# Patient Record
Sex: Male | Born: 1955 | Race: White | Hispanic: No | Smoking: Former smoker
Health system: Southern US, Community
[De-identification: ages and names within clinical notes are randomized; demographics above are authoritative.]

## PROBLEM LIST (undated history)

## (undated) DIAGNOSIS — C801 Malignant (primary) neoplasm, unspecified: Secondary | ICD-10-CM

## (undated) DIAGNOSIS — E079 Disorder of thyroid, unspecified: Secondary | ICD-10-CM

## (undated) DIAGNOSIS — F419 Anxiety disorder, unspecified: Secondary | ICD-10-CM

## (undated) DIAGNOSIS — F32A Depression, unspecified: Secondary | ICD-10-CM

## (undated) MED FILL — Fosaprepitant Dimeglumine For IV Infusion 150 MG (Base Eq): INTRAVENOUS | Qty: 5 | Status: AC

## (undated) MED FILL — Dexamethasone Sodium Phosphate Inj 100 MG/10ML: INTRAMUSCULAR | Qty: 1 | Status: AC

---

## 1999-10-18 ENCOUNTER — Encounter: Admission: RE | Admit: 1999-10-18 | Discharge: 1999-11-09 | Payer: Self-pay

## 2018-07-26 ENCOUNTER — Encounter (HOSPITAL_BASED_OUTPATIENT_CLINIC_OR_DEPARTMENT_OTHER): Payer: Self-pay | Admitting: Emergency Medicine

## 2018-07-26 ENCOUNTER — Other Ambulatory Visit: Payer: Self-pay

## 2018-07-26 ENCOUNTER — Emergency Department (HOSPITAL_BASED_OUTPATIENT_CLINIC_OR_DEPARTMENT_OTHER): Payer: Commercial Managed Care - PPO

## 2018-07-26 ENCOUNTER — Emergency Department (HOSPITAL_BASED_OUTPATIENT_CLINIC_OR_DEPARTMENT_OTHER)
Admission: EM | Admit: 2018-07-26 | Discharge: 2018-07-26 | Disposition: A | Discharge status: Home / Self Care | Payer: Commercial Managed Care - PPO | Attending: Emergency Medicine | Admitting: Emergency Medicine

## 2018-07-26 DIAGNOSIS — Y929 Unspecified place or not applicable: Secondary | ICD-10-CM | POA: Diagnosis not present

## 2018-07-26 DIAGNOSIS — W11XXXA Fall on and from ladder, initial encounter: Secondary | ICD-10-CM | POA: Diagnosis not present

## 2018-07-26 DIAGNOSIS — S0083XA Contusion of other part of head, initial encounter: Secondary | ICD-10-CM | POA: Insufficient documentation

## 2018-07-26 DIAGNOSIS — S59911A Unspecified injury of right forearm, initial encounter: Secondary | ICD-10-CM | POA: Diagnosis present

## 2018-07-26 DIAGNOSIS — S52501A Unspecified fracture of the lower end of right radius, initial encounter for closed fracture: Secondary | ICD-10-CM | POA: Diagnosis not present

## 2018-07-26 DIAGNOSIS — Y939 Activity, unspecified: Secondary | ICD-10-CM | POA: Diagnosis not present

## 2018-07-26 DIAGNOSIS — Y999 Unspecified external cause status: Secondary | ICD-10-CM | POA: Diagnosis not present

## 2018-07-26 MED ORDER — OXYCODONE-ACETAMINOPHEN 5-325 MG PO TABS
1.0000 | ORAL_TABLET | ORAL | 0 refills | Status: AC | PRN
Start: 2018-07-26 — End: 2018-07-29

## 2018-07-26 MED ORDER — OXYCODONE-ACETAMINOPHEN 5-325 MG PO TABS
1.0000 | ORAL_TABLET | Freq: Once | ORAL | Status: AC
  Administered 2018-07-26: 1 via ORAL
  Filled 2018-07-26: qty 1

## 2018-07-26 NOTE — ED Provider Notes (Signed)
[facility] Provider Note   CSN: [id number] Arrival date & time: 07/26/18  1006     History   Chief Complaint Chief Complaint  Patient presents with  . Fall    HPI Alec Gibson is a 62 y.o. male.  62 y/o male with a PMH of thyroid disease presents to the ED s/p fall x 1 hour ago.Patient was standing on 4 feet ladder and fell off landing on his right hand and face. Patient states he was able to move his right hand after the accident but is unable to do so now without pain. He denies any LOC or headache at the moment. Patient is not on blood thinners. He denies any LOC, chest pain or shortness of breath.      History reviewed. No pertinent past medical history.  There are no active problems to display for this patient.   History reviewed. No pertinent surgical history.      Home Medications    Prior to Admission medications   Medication Sig Start Date End Date Taking? Authorizing Provider  oxyCODONE-acetaminophen (PERCOCET/ROXICET) 5-325 MG tablet Take 1 tablet by mouth every 4 (four) hours as needed for up to 3 days for severe pain. 07/26/18 07/29/18  Janeece Fitting, PA-C    Family History No family history on file.  Social History Social History   Tobacco Use  . Smoking status: Never Smoker  . Smokeless tobacco: Never Used  Substance Use Topics  . Alcohol use: Yes  . Drug use: Never     Allergies   Sulfa antibiotics   Review of Systems Review of Systems  Constitutional: Negative for chills and fever.  HENT: Negative for ear pain and sore throat.   Eyes: Negative for pain and visual disturbance.  Respiratory: Negative for cough and shortness of breath.   Cardiovascular: Negative for chest pain and palpitations.  Gastrointestinal: Negative for abdominal pain and vomiting.  Genitourinary: Negative for dysuria and hematuria.  Musculoskeletal: Positive for arthralgias and joint swelling. Negative for back pain.  Skin: Negative  for color change and rash.  Neurological: Negative for seizures and syncope.  All other systems reviewed and are negative.    Physical Exam Updated Vital Signs BP 140/84 (BP Location: Left Arm)   Pulse 70   Temp 98.4 F (36.9 C) (Oral)   Resp 18   SpO2 100%   Physical Exam  Constitutional: He is oriented to person, place, and time. He appears well-developed and well-nourished.  HENT:  Head: Normocephalic and atraumatic.  Mouth/Throat: Oropharynx is clear and moist.  Eyes: Pupils are equal, round, and reactive to light. No scleral icterus.  Neck: Normal range of motion.  Cardiovascular: Normal heart sounds.  Pulses:      Radial pulses are 2+ on the right side, and 2+ on the left side.  Pulmonary/Chest: Effort normal and breath sounds normal. He has no wheezes. He exhibits no tenderness.  Abdominal: Soft. Bowel sounds are normal. He exhibits no distension. There is no tenderness.  Musculoskeletal: He exhibits edema, tenderness and deformity.       Right wrist: He exhibits decreased range of motion, tenderness, bony tenderness, swelling and deformity. He exhibits no laceration.       Arms:      Right hand: He exhibits normal capillary refill, no deformity, no laceration and no swelling. Decreased strength noted. He exhibits no finger abduction, no thumb/finger opposition and no wrist extension trouble.  Pulses present. Radial, ulnar and median tested, intact.  Neurological: He is alert and oriented to person, place, and time.  Skin: Skin is warm and dry.  Nursing note and vitals reviewed.    ED Treatments / Results  Labs (all labs ordered are listed, but only abnormal results are displayed) Labs Reviewed - No data to display  EKG None  Radiology Dg Forearm Right  Result Date: 07/26/2018 CLINICAL DATA:  Fall from ladder today with right wrist pain/deformity. EXAM: RIGHT FOREARM - 2 VIEW COMPARISON:  None. FINDINGS: Evidence patient's displaced comminuted fracture of the  distal radius with extension to the articular surface. No other fractures identified. IMPRESSION: Comminuted displaced distal radial fracture with extension to the articular surface. No additional fractures identified. Electronically Signed   By: Marin Olp M.D.   On: 07/26/2018 11:19   Dg Wrist Complete Right  Result Date: 07/26/2018 CLINICAL DATA:  Fall from stepladder today with right wrist injury. EXAM: RIGHT WRIST - COMPLETE 3+ VIEW COMPARISON:  None. FINDINGS: There is a displaced comminuted fracture of the distal radius extending to the articular surface. Remainder of the exam is unremarkable. IMPRESSION: Displaced comminuted fracture of the distal radius with extension to the articular surface. Electronically Signed   By: Marin Olp M.D.   On: 07/26/2018 10:46    Procedures Procedures (including critical care time)  Medications Ordered in ED Medications  oxyCODONE-acetaminophen (PERCOCET/ROXICET) 5-325 MG per tablet 1 tablet (1 tablet Oral Given 07/26/18 1106)     Initial Impression / Assessment and Plan / ED Course  I have reviewed the triage vital signs and the nursing notes.  Pertinent labs & imaging results that were available during my care of the patient were reviewed by me and considered in my medical decision making (see chart for details).     Patient presents s/p fall from ladder. DG right wrist showed a displaced comminuted fracture of the distal radius. Patient is neurovascularly intact. Pulses presents.  Patient received percocet for pain, called neighbor for ride.   Consult place to Dr.Murphy who advised patient be place on sugar tong splint and have he will see him in clinic tomorrow Monday 08/19 in the morning. I will send patient home with pain control and follow up with Hand specialist tomorrow morning. Return precautions provided.   Final Clinical Impressions(s) / ED Diagnoses   Final diagnoses:  Closed fracture of distal end of right radius,  unspecified fracture morphology, initial encounter    ED Discharge Orders         Ordered    oxyCODONE-acetaminophen (PERCOCET/ROXICET) 5-325 MG tablet  Every 4 hours PRN     07/26/18 1156           Janeece Fitting, PA-C 07/26/18 1158    Julianne Rice, MD 07/28/18 1727

## 2018-07-26 NOTE — ED Triage Notes (Signed)
Pt fell off of a 4 foot ladder injuring R wrist, deformity noted. Also has an abrasion to forehead. Denies LOC or other injury.

## 2018-07-26 NOTE — Discharge Instructions (Signed)
I have provided medication for your pain please

## 2019-08-02 IMAGING — CR DG FOREARM 2V*R*
2 series · 2 of 2 positions shown · non-contrast
Comparison: None.

CLINICAL DATA: Fall from ladder today with right wrist
pain/deformity.

EXAM:
RIGHT FOREARM - 2 VIEW

[x forearm ap right]
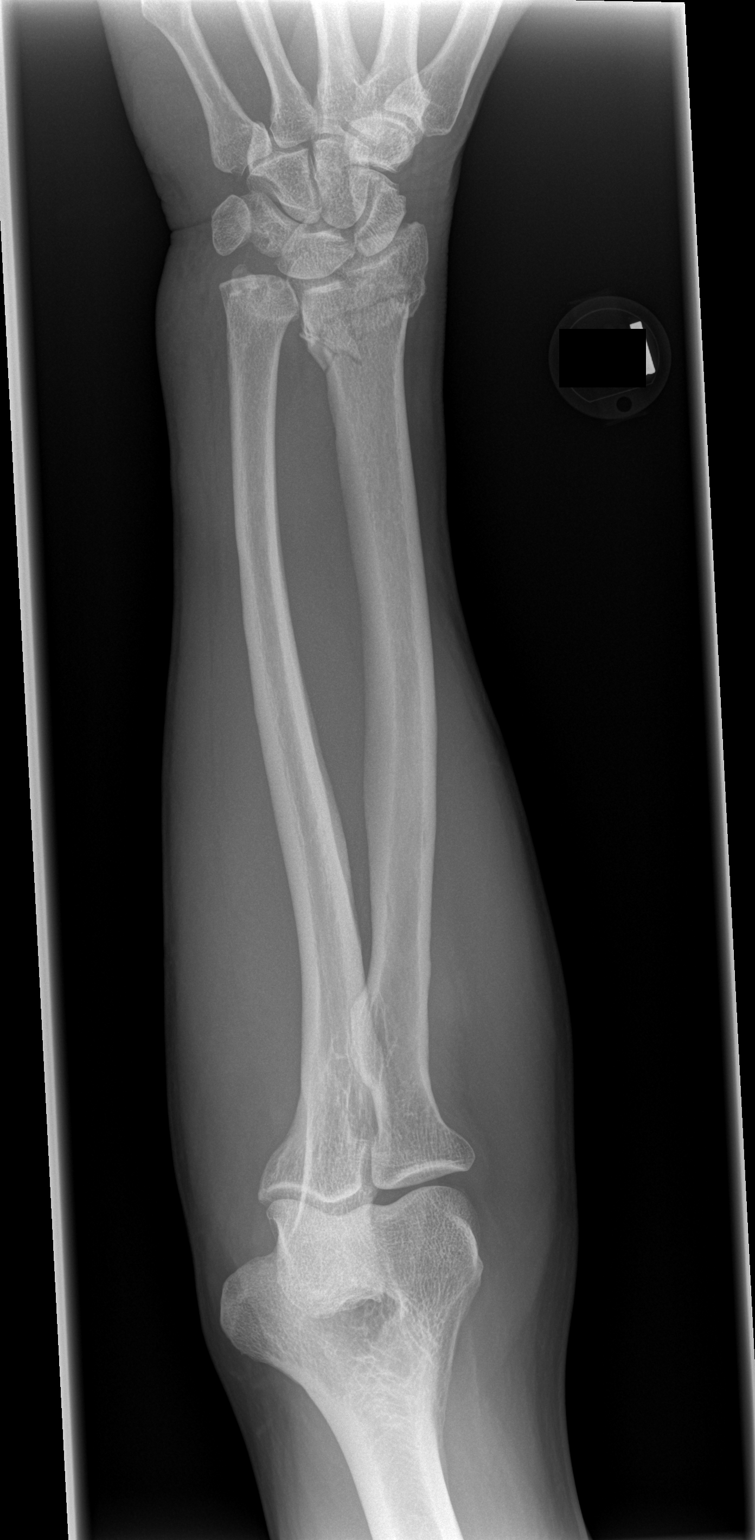

[x forearm lat right]
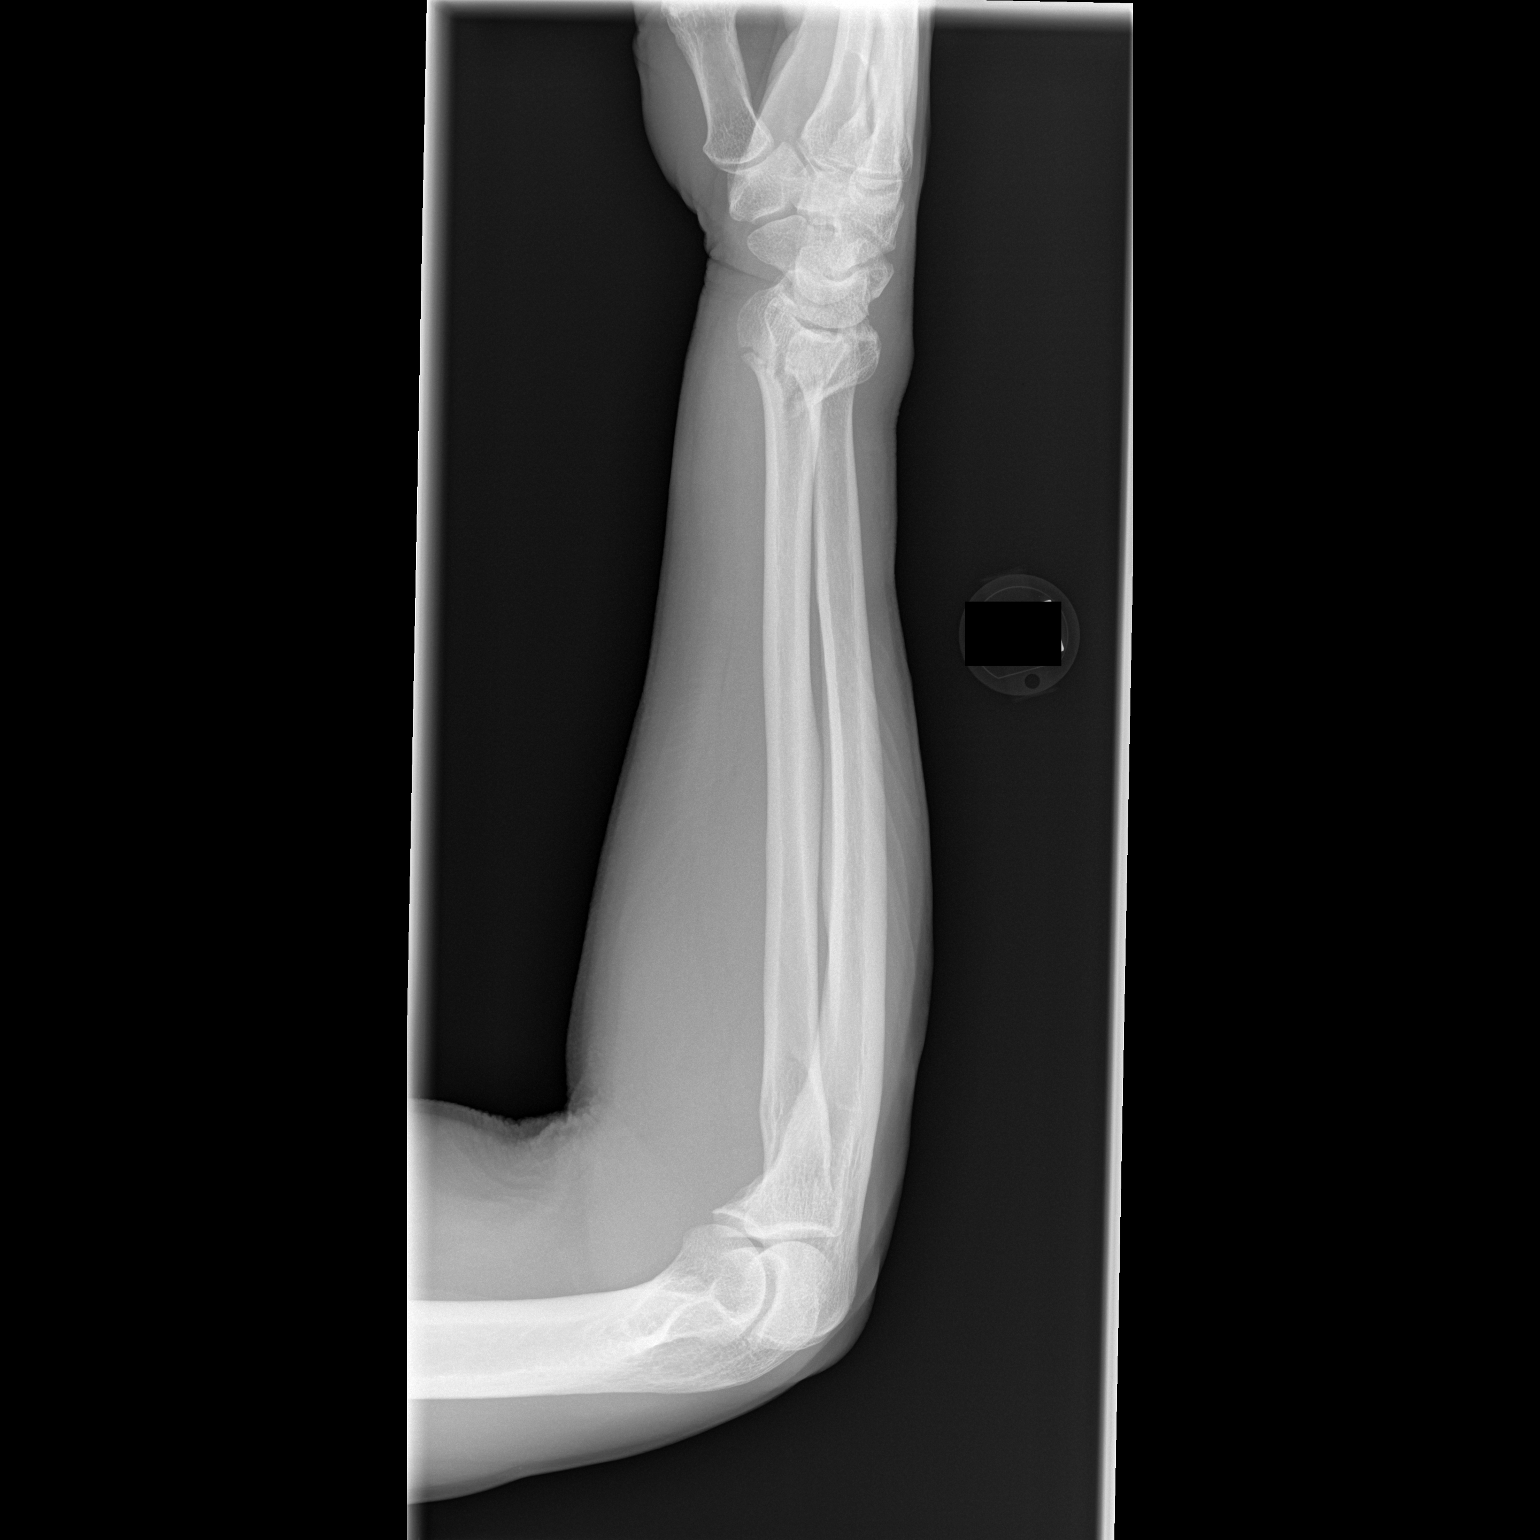

[2 of 2 positions shown; findings below may reference images not displayed]

FINDINGS: Evidence patient's displaced comminuted fracture of the distal
radius with extension to the articular surface. No other fractures
identified.
IMPRESSION: Comminuted displaced distal radial fracture with extension to the
articular surface. No additional fractures identified.

## 2019-08-02 IMAGING — DX DG WRIST COMPLETE 3+V*R*
3 series · 3 of 3 positions shown · non-contrast
Comparison: None.

CLINICAL DATA: Fall from stepladder today with right wrist injury.

EXAM:
RIGHT WRIST - COMPLETE 3+ VIEW

[wrist pa]
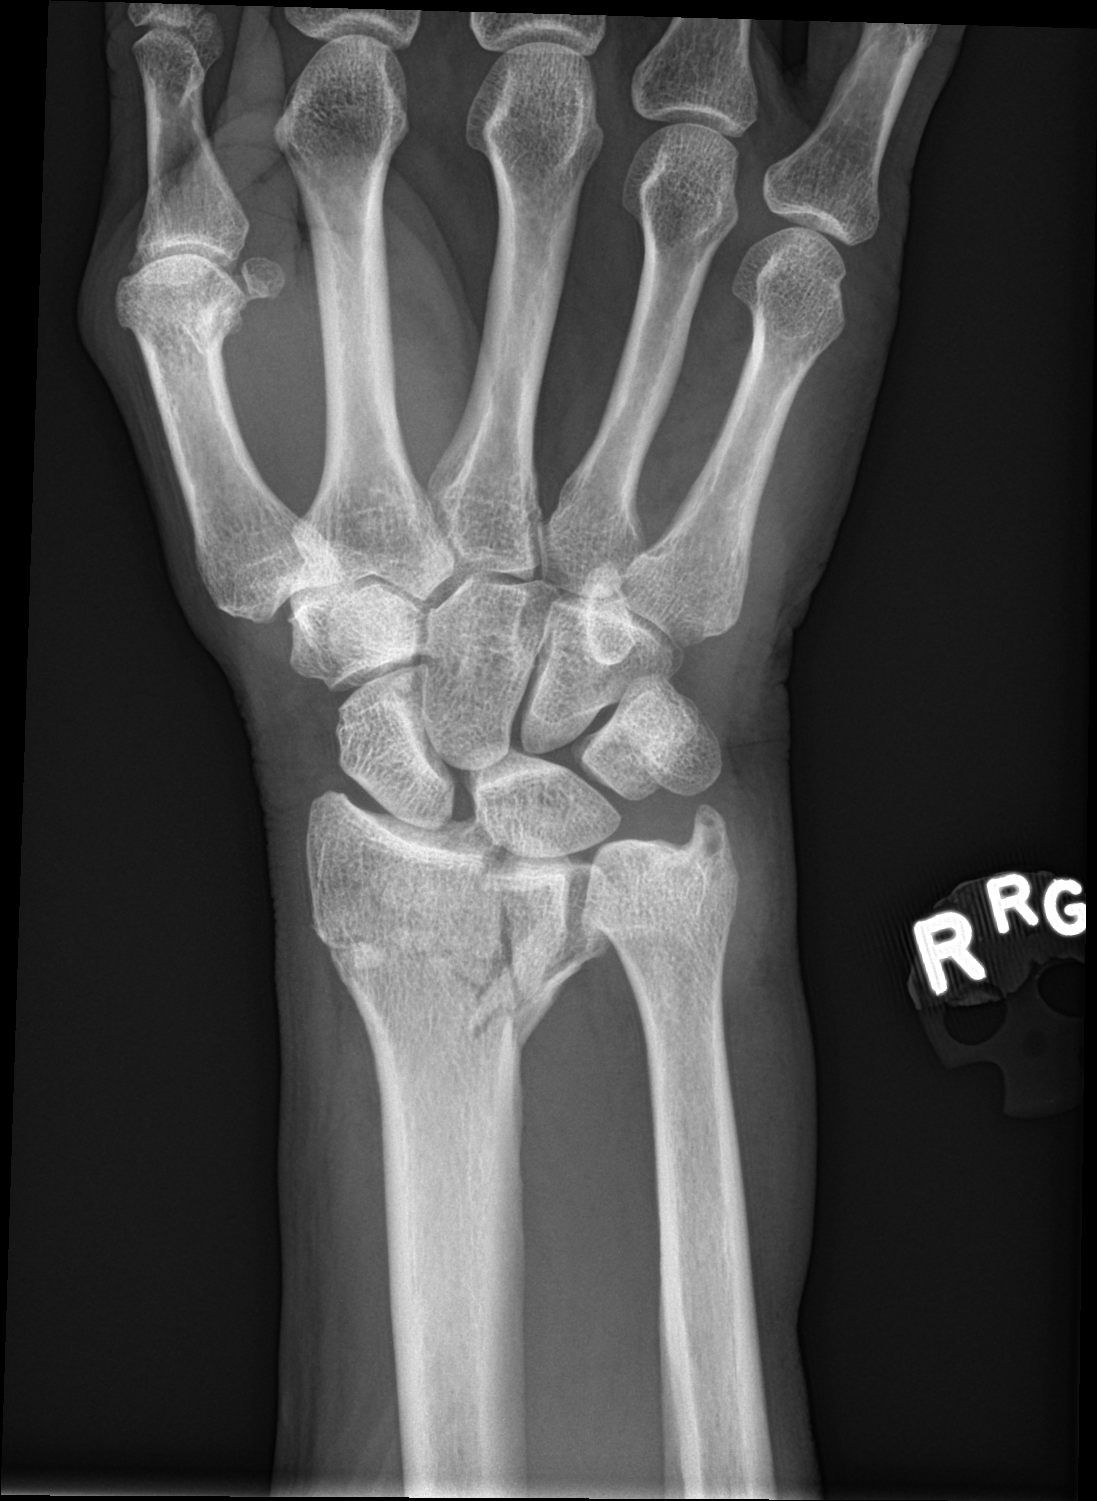

[wrist obl]
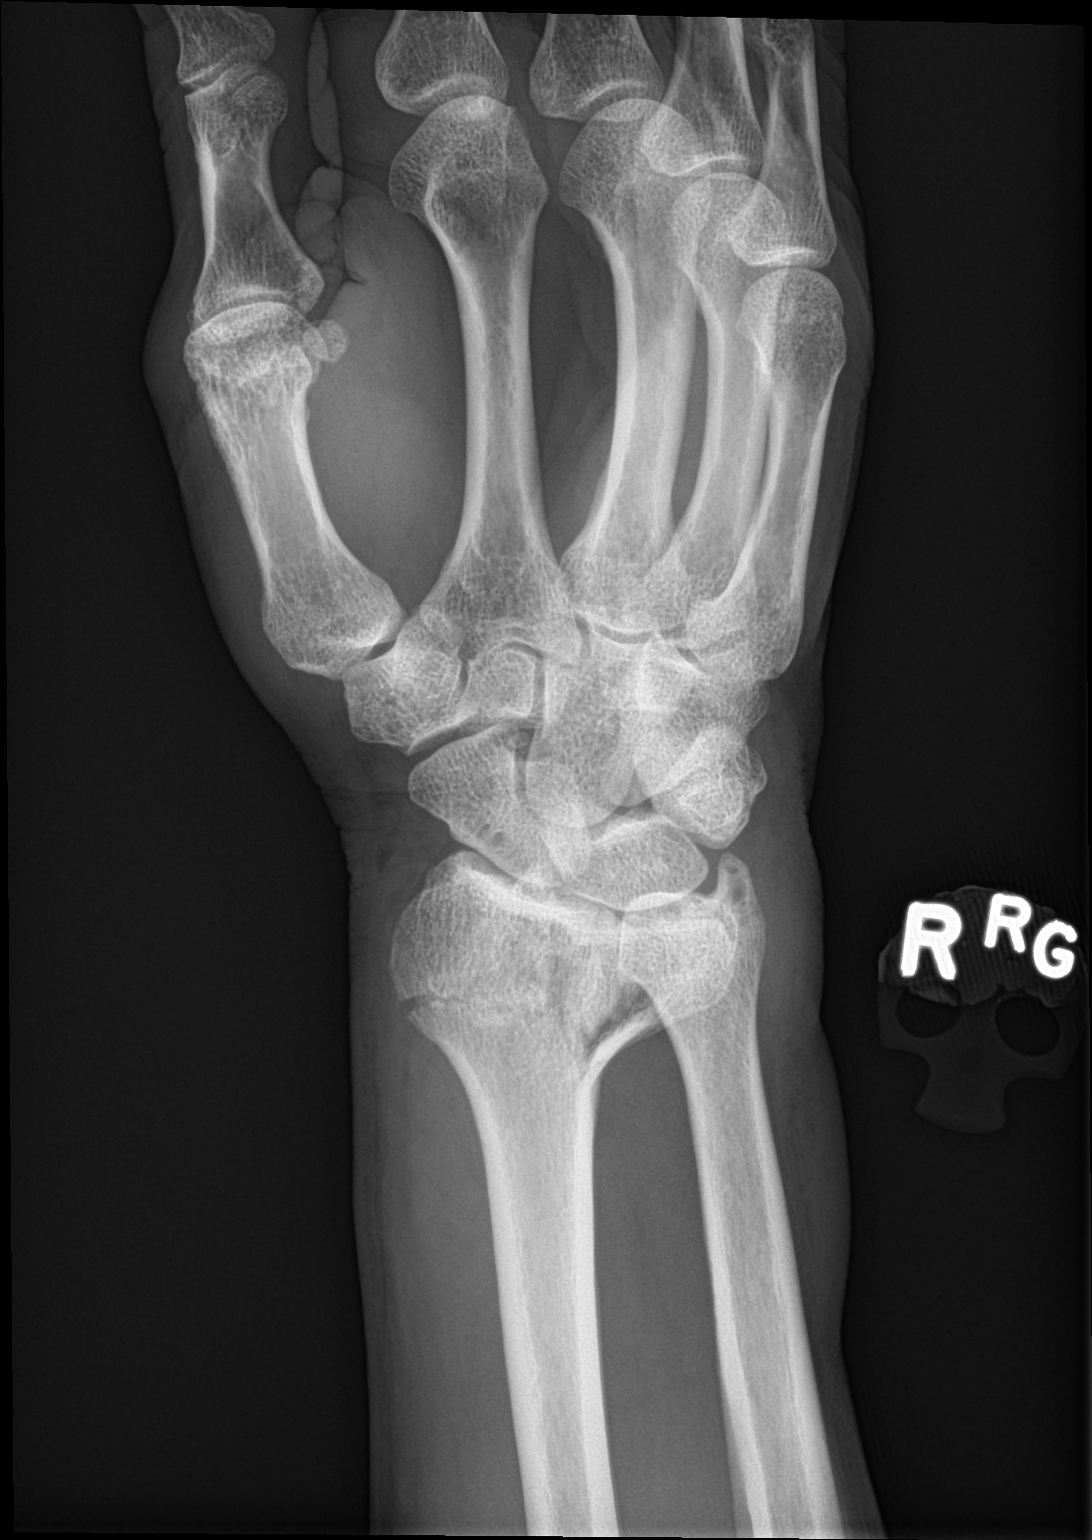

[wrist lat]
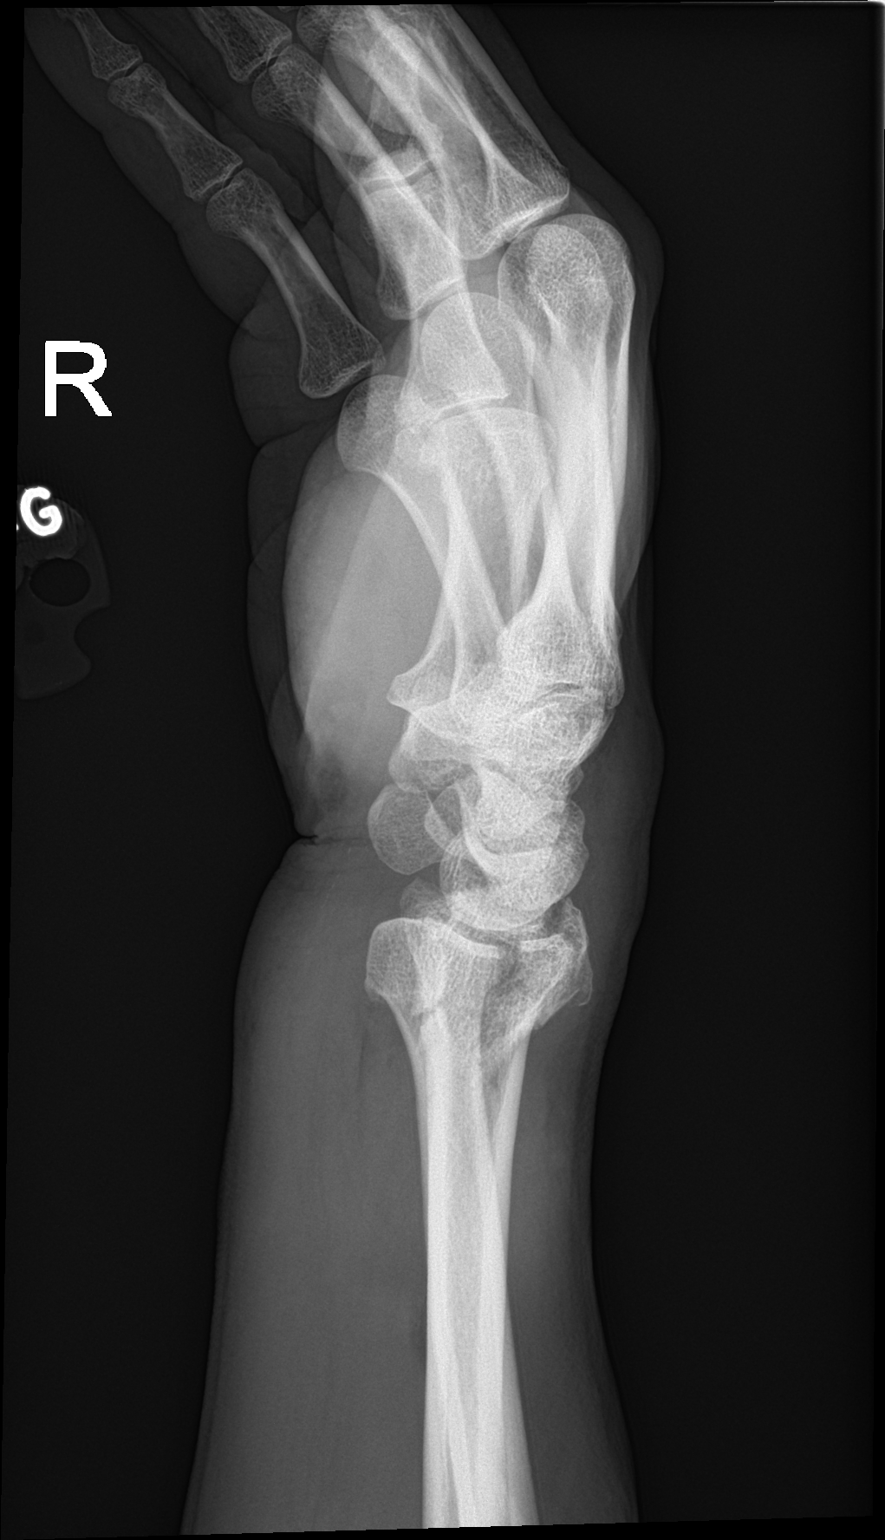

[3 of 3 positions shown; findings below may reference images not displayed]

FINDINGS: There is a displaced comminuted fracture of the distal radius
extending to the articular surface. Remainder of the exam is
unremarkable.
IMPRESSION: Displaced comminuted fracture of the distal radius with extension to
the articular surface.

## 2022-10-24 ENCOUNTER — Other Ambulatory Visit (HOSPITAL_COMMUNITY): Payer: Self-pay | Admitting: Otolaryngology

## 2022-10-24 DIAGNOSIS — C109 Malignant neoplasm of oropharynx, unspecified: Secondary | ICD-10-CM

## 2022-10-30 ENCOUNTER — Ambulatory Visit (HOSPITAL_BASED_OUTPATIENT_CLINIC_OR_DEPARTMENT_OTHER)
Admission: RE | Admit: 2022-10-30 | Discharge: 2022-10-30 | Disposition: A | Discharge status: Home / Self Care | Payer: 59 | Source: Ambulatory Visit | Attending: Otolaryngology | Admitting: Otolaryngology

## 2022-10-30 DIAGNOSIS — C109 Malignant neoplasm of oropharynx, unspecified: Secondary | ICD-10-CM | POA: Diagnosis present

## 2022-10-30 MED ORDER — IOHEXOL 300 MG/ML  SOLN
70.0000 mL | Freq: Once | INTRAMUSCULAR | Status: AC | PRN
  Administered 2022-10-30: 70 mL via INTRAVENOUS

## 2022-11-12 ENCOUNTER — Other Ambulatory Visit (HOSPITAL_COMMUNITY): Payer: Self-pay | Admitting: Otolaryngology

## 2022-11-12 ENCOUNTER — Telehealth: Payer: Self-pay | Admitting: Hematology and Oncology

## 2022-11-12 DIAGNOSIS — C109 Malignant neoplasm of oropharynx, unspecified: Secondary | ICD-10-CM

## 2022-11-12 NOTE — Progress Notes (Signed)
Oncology Nurse Navigator Documentation   Placed introductory call to new referral patient Alec Gibson. Introduced myself as the H&N oncology nurse navigator that works with Dr. Isidore Moos and Dr. Chryl Heck to whom he has been referred by Dr. Sabino Gasser. He confirmed understanding of referral. Briefly explained my role as his navigator, provided my contact information.  Confirmed understanding of upcoming appts that will be scheduled once the date of his PET is known.  I explained the purpose of a dental evaluation prior to starting RT, indicated he would be contacted by [facility] to arrange an appt.   I encouraged him to call with questions/concerns as he moves forward with appts and procedures.   He verbalized understanding of information provided, expressed appreciation for my call.   Navigator Initial Assessment Employment Status: he is employed Currently on [employer] / STD: no Living Situation:  Support System: family, son PCP: none PCD: Financial Concerns: not at this time Transportation Needs: no Sensory Deficits: no Engineer, building services Needed:  no Ambulation Needs: no Psychosocial Needs:  no Concerns/Needs Understanding Cancer:  addressed/answered by navigator to best of ability Self-Expressed Needs: no   Clinical biochemist, BSN, OCN Head & Neck Oncology Nurse [place] [facility] Phone # [phone]  Fax # [phone]

## 2022-11-12 NOTE — Telephone Encounter (Signed)
Scheduled appointment per referral. Patient is aware of appointment date and time. Patient is aware to arrive 15 mins prior to appointment time and to bring updated insurance cards. Patient is aware of location.   

## 2022-11-13 NOTE — Progress Notes (Signed)
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 '[x]'$   '[]'$    lost 10lbs  Swallowing concerns? '[x]'$   '[]'$    tongue swelling  PEG? '[]'$   '[x]'$        Referrals Yes No Comments  Social Work? '[]'$   '[x]'$      Dentistry? '[x]'$   '[]'$    saw cancer dentist recently  Swallowing therapy? '[]'$   '[x]'$      Nutrition? '[]'$   '[x]'$      Med/Onc? '[x]'$   '[]'$    saw dr. Chryl Heck on the 7th    Safety Issues Yes No Comments  Prior radiation? '[]'$   '[x]'$      Pacemaker/ICD? '[]'$   '[x]'$      Possible current pregnancy? '[]'$   '[x]'$      Is the patient on methotrexate? 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 Chryl Heck on 11-14-22  ASSESSMENT & PLAN:    This is a very pleasant 66 year old healthy male patient with no significant past medical history newly diagnosed with squamous cell carcinoma both sides, locally advanced with ipsilateral and possibly contralateral lymphadenopathy as well presented to medical oncology for recommendations.  Given the invasion of skeletal muscle, he may be actually a T4 N2 M0 staging so far.  We are awaiting PET/CT for complete staging.  We have discussed that for locally advanced HPV positive oropharyngeal cancers, primary treatment would be to recommend concurrent chemoradiation provided there is no evidence of metastatic disease. I have discussed about the regimen, schedule, adverse effects history significant for chest pain ototoxicity, toxicity, peripheral neuropathy.  He  understands that some of the side effects can be life-threatening and permanent. He will return to clinic in about 2 weeks to initiate chemotherapy.  If he were to have metastatic disease, he understands the intent of treatment becomes palliative he may not receive radiation He appears to be very healthy at baseline with no major, very days.  Currently he understands the role of PEG tube as well as port for chemotherapy administration.  He is okay to proceed with any treatment that we recommended.  Breast cancer.  For pain control, he can take hydrocodone as needed which has been helping him.  If he needs any future refills, he can contact us for pain management. Thank you for consulting [place] in the care of this patient.  Please do not hesitate to contact us with any additional questions or concerns    Current Complaints / other details: ear/ jaw ache then goes to head, wants to know when he can start treatment    Vitals:   11/22/22 0730  BP: 126/79  Pulse: 74  Resp: 18  Temp: (!) 97 F (36.1 C)  SpO2: 98%

## 2022-11-13 NOTE — Progress Notes (Incomplete)
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 '[]'$  '[]'$    Swallowing concerns? '[]'$  '[]'$    PEG? '[]'$  '[]'$     Referrals Yes No Comments  Social Work? '[]'$  '[]'$    Dentistry? '[]'$  '[]'$    Swallowing therapy? '[]'$  '[]'$    Nutrition? '[]'$  '[]'$    Med/Onc? '[]'$  '[]'$     Safety Issues Yes No Comments  Prior radiation? '[]'$  '[]'$    Pacemaker/ICD? '[]'$  '[]'$    Possible current pregnancy? '[]'$  '[]'$    Is the patient on methotrexate? 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 Chryl Heck on 11-14-22   Current Complaints / other details:  ***

## 2022-11-14 ENCOUNTER — Other Ambulatory Visit: Payer: Self-pay

## 2022-11-14 ENCOUNTER — Inpatient Hospital Stay: Payer: 59

## 2022-11-14 ENCOUNTER — Encounter: Payer: Self-pay | Admitting: Hematology and Oncology

## 2022-11-14 ENCOUNTER — Inpatient Hospital Stay: Payer: 59 | Attending: Hematology and Oncology | Admitting: Hematology and Oncology

## 2022-11-14 VITALS — BP 136/87 | HR 79 | Temp 98.4°F | Resp 16 | Ht 69.0 in | Wt 154.0 lb

## 2022-11-14 DIAGNOSIS — C109 Malignant neoplasm of oropharynx, unspecified: Secondary | ICD-10-CM | POA: Insufficient documentation

## 2022-11-14 NOTE — Progress Notes (Signed)
[place] CONSULT NOTE  Patient Care Team: Patient, No Pcp Per as PCP - General (General Practice)  CHIEF COMPLAINTS/PURPOSE OF CONSULTATION:  SCC oropharynx  ASSESSMENT & PLAN:   This is a very pleasant 66 year old healthy male patient with no significant past medical history newly diagnosed with squamous cell carcinoma both sides, locally advanced with ipsilateral and possibly contralateral lymphadenopathy as well presented to medical oncology for recommendations.  Given the invasion of skeletal muscle, he may be actually a T4 N2 M0 staging so far.  We are awaiting PET/CT for complete staging.  We have discussed that for locally advanced HPV positive oropharyngeal cancers, primary treatment would be to recommend concurrent chemoradiation provided there is no evidence of metastatic disease. I have discussed about the regimen, schedule, adverse effects history significant for chest pain ototoxicity, toxicity, peripheral neuropathy.  He understands that some of the side effects can be life-threatening and permanent. He will return to clinic in about 2 weeks to initiate chemotherapy.  If he were to have metastatic disease, he understands the intent of treatment becomes palliative he may not receive radiation He appears to be very healthy at baseline with no major, very days.  Currently he understands the role of PEG tube as well as port for chemotherapy administration.  He is okay to proceed with any treatment that we recommended.  Breast cancer.  For pain control, he can take hydrocodone as needed which has been helping him.  If he needs any future refills, he can contact us for pain management. Thank you for consulting [place] in the care of this patient.  Please do not hesitate to contact us with any additional questions or concerns  HISTORY OF PRESENTING ILLNESS:  Alec Gibson 66 y.o. 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  Chronology  [name] is a 66 year old male patient no  significant medical history who first noticed pain in the right side of his jaw as well as his ER about 3 to 4 months ago.  He used to notice it when he suddenly moves his head towards the right side but did not think much of it.  About for the past month and a half he had excruciating episodes of pain.  He describes this pain as it was not similar job and moves to his ear followed by severe pain in the neck.   The pain is so intense that he has to moan at times.   He got some medication from Dr. Sabino Gibson hydrocodone which she has been taking as needed, he does not really need 1 of this again.  Other than this pain, he denies any new complaints.  He is able to swallow everything.  He had CT imaging on 10/30/2022 which showed 3.5 x 2.3 cm solid mass lesion centered in the right Tonsil with effacement of the edges Glossotonsillar sulcus and likely posteromedial extent along the posterior oropharyngeal wall consistent with provided history of oropharyngeal neoplasm.  There is also a 1.4 cm morphologically suspicious right level 1B lymph node and additional suspected pathologic lymphadenopathy at level 2 with short segment occlusion of right internal jugular vein in the mid neck suboptimally evaluated due to streak artifact from dental amalgam.  He has his PET/CT scheduled for 11/21/2022.  There is a palpable left-sided neck lymph node which was not mentioned on the CT scan.  With regards to this lymph node patient mentioned that this lymph node has been there for at least 3 to 4 months and tends  to flareup.  There was ultrasound recommended at 1 point however since the lymph node got smaller, they deferred it at that time. He denies any history of smoking, quit 40 years ago.  He occasionally drinks alcohol.  Rest of the pertinent 10 point ROS reviewed and negative.  REVIEW OF SYSTEMS:   Constitutional: Denies fevers, chills or abnormal night sweats Eyes: Denies blurriness of vision, double vision or watery  eyes Ears, nose, mouth, throat, and face: Denies mucositis or sore throat Respiratory: Denies cough, dyspnea or wheezes Cardiovascular: Denies palpitation, chest discomfort or lower extremity swelling Gastrointestinal:  Denies nausea, heartburn or change in bowel habits Skin: Denies abnormal skin rashes Lymphatics: Denies new lymphadenopathy or easy bruising Neurological:Denies numbness, tingling or new weaknesses Behavioral/Psych: Mood is stable, no new changes  All other systems were reviewed with the patient and are negative.  MEDICAL HISTORY:  No past medical history on file.  SURGICAL HISTORY: No past surgical history on file.  SOCIAL HISTORY: Social History   Socioeconomic History   Marital status: Divorced    Spouse name: Not on file   Number of children: Not on file   Years of education: Not on file   Highest education level: Not on file  Occupational History   Not on file  Tobacco Use   Smoking status: Never   Smokeless tobacco: Never  Substance and Sexual Activity   Alcohol use: Yes   Drug use: Never   Sexual activity: Not on file  Other Topics Concern   Not on file  Social History Narrative   Not on file   Social Determinants of Health   Financial Resource Strain: Not on file  Food Insecurity: Not on file  Transportation Needs: Not on file  Physical Activity: Not on file  Stress: Not on file  Social Connections: Not on file  Intimate Partner Violence: Not on file    FAMILY HISTORY: No family history on file.  ALLERGIES:  is allergic to sulfa antibiotics.  MEDICATIONS:  No current outpatient medications on file.   No current facility-administered medications for this visit.     PHYSICAL EXAMINATION: ECOG PERFORMANCE STATUS: 1 - Symptomatic but completely ambulatory  Vitals:   11/14/22 1003  BP: 136/87  Pulse: 79  Resp: 16  Temp: 98.4 F (36.9 C)  SpO2: 96%   Filed Weights   11/14/22 1003  Weight: 154 lb (69.9 kg)     GENERAL:alert, no distress and comfortable SKIN: skin color, texture, turgor are normal, no rashes or significant lesions EYES: normal, conjunctiva are pink and non-injected, sclera clear OROPHARYNX: Right tonsillar mass noted on oral exam  NECK: Palpable bilateral upper cervical lymphadenopathy.  LYMPH:  no palpable lymphadenopathy in the cervical, axillary or inguinal LUNGS: clear to auscultation and percussion with normal breathing effort HEART: regular rate & rhythm and no murmurs and no lower extremity edema ABDOMEN:abdomen soft, non-tender and normal bowel sounds Musculoskeletal:no cyanosis of digits and no clubbing  PSYCH: alert & oriented x 3 with fluent speech NEURO: no focal motor/sensory deficits  LABORATORY DATA:  I have reviewed the data as listed No results found for: "WBC", "HGB", "HCT", "MCV", "PLT"   Chemistry   No results found for: "NA", "K", "CL", "CO2", "BUN", "CREATININE", "GLU" No results found for: "CALCIUM", "ALKPHOS", "AST", "ALT", "BILITOT"     RADIOGRAPHIC STUDIES: I have personally reviewed the radiological images as listed and agreed with the findings in the report. CT SOFT TISSUE NECK W CONTRAST  Result Date: 10/30/2022 CLINICAL  DATA:  Oropharyngeal neoplasm. EXAM: CT NECK WITH CONTRAST TECHNIQUE: Multidetector CT imaging of the neck was performed using the standard protocol following the bolus administration of intravenous contrast. RADIATION DOSE REDUCTION: This exam was performed according to the departmental dose-optimization program which includes automated exposure control, adjustment of the mA and/or kV according to patient size and/or use of iterative reconstruction technique. CONTRAST:  28m OMNIPAQUE IOHEXOL 300 MG/ML  SOLN COMPARISON:  None Available. FINDINGS: Pharynx and larynx: The nasal cavity and nasopharynx are unremarkable. Evaluation of the oral cavity and oropharynx is degraded by streak artifact from dental amalgam. Within this  confine: There is asymmetric fullness of the right palatine tonsil with a solid mass lesion measuring approximately 3.5 cm x 2.3 cm in the axial plane (3-36). There is effacement of the adjacent parapharyngeal fat at this level and slight asymmetric effacement of the right glossotonsillar sulcus and likely posteromedial extent along the posterior oropharyngeal wall (3-38). The lesion does not definitely cross midline to involve the left tonsillar region. The oral tongue and floor of mouth appear normal. The base of tongue is normal. The hypopharynx and larynx are unremarkable. The vocal folds are normal in appearance. There is no retropharyngeal fluid collection. Salivary glands: The parotid and submandibular glands are unremarkable. Thyroid: Unremarkable. Lymph nodes: There is a 1.4 cm right level IB lymph node which is morphologically suspicious but without evidence of extracapsular extension (3-61). The right internal jugular vein is occluded in the mid neck, suspected due to lymph nodes/lymph node conglomerate (8-44, 8-50)., though this is significantly suboptimally evaluated due to streak artifact. Vascular: There is calcified plaque at the carotid bifurcations, right worse than left. As above, there is short-segment occlusion of the right internal jugular vein. The major vasculature of the neck is otherwise unremarkable. Limited intracranial: The imaged portions of the intracranial compartment are unremarkable. Visualized orbits: The globes and orbits are unremarkable. Mastoids and visualized paranasal sinuses: Clear. Skeleton: There is no acute osseous abnormality or suspicious osseous lesion. Upper chest: The imaged lung apices are clear. Other: None. IMPRESSION: 1. Approximately 3.5 cm x 2.3 cm solid mass lesion centered in the right palatine tonsil with effacement of the adjacent parapharyngeal fat, right glossotonsillar sulcus, and likely posteromedial extent along the posterior oropharyngeal wall  consistent with the provided history of oropharyngeal neoplasm. 2. 1.4 cm morphologically suspicious right level IB lymph node, and additional suspected pathologic lymphadenopathy at level II with short-segment occlusion of the right internal jugular vein in the mid neck, suboptimally evaluated due to streak artifact from dental amalgam. Electronically Signed   By: PValetta MoleM.D.   On: 10/30/2022 15:36    All questions were answered. The patient knows to call the clinic with any problems, questions or concerns. I spent 45 minutes in the care of this patient including H and P, review of records, counseling and coordination of care.     PBenay Pike MD 11/14/2022 10:35 AM

## 2022-11-14 NOTE — Progress Notes (Signed)
Oncology Nurse Navigator Documentation   Met with patient during initial consult with Dr. Chryl Heck.  Further introduced myself as his Navigator, explained my role as a member of the Care Team. Provided New Patient resource guide binder: Contact information for physicians, this navigator, other members of the Care Team Advance Directive information; provided [facility] AD booklet at their request,  Fall Prevention [facility] [place] Information sheet [facility] information [facility] map with highlight of [place] [facility] Information sheet Head and Neck cancer basics Nutrition information Patient and family support information including Spiritual care/Chaplain information, Peer mentor program, health and wellness classes, and the survivorship program Community resources  Provided and discussed educational handouts for PEG and PAC. Assisted with post-consult appt scheduling.   He verbalized understanding of information provided. I encouraged him to call with questions/concerns moving forward.  Harlow Asa, RN, BSN, OCN Head & Neck Oncology Nurse [place] [facility] [place] [phone]

## 2022-11-18 ENCOUNTER — Other Ambulatory Visit: Payer: Self-pay | Admitting: Hematology and Oncology

## 2022-11-18 MED ORDER — HYDROCODONE-ACETAMINOPHEN 5-325 MG PO TABS: 1.0000 | ORAL_TABLET | Freq: Four times a day (QID) | ORAL | 0 refills | Status: DC | PRN

## 2022-11-19 ENCOUNTER — Ambulatory Visit (INDEPENDENT_AMBULATORY_CARE_PROVIDER_SITE_OTHER): Payer: 59 | Admitting: Dentistry

## 2022-11-19 ENCOUNTER — Encounter (HOSPITAL_COMMUNITY): Payer: Self-pay | Admitting: Dentistry

## 2022-11-19 VITALS — BP 131/77 | HR 63 | Temp 98.4°F

## 2022-11-19 DIAGNOSIS — K08109 Complete loss of teeth, unspecified cause, unspecified class: Secondary | ICD-10-CM | POA: Insufficient documentation

## 2022-11-19 DIAGNOSIS — Z463 Encounter for fitting and adjustment of dental prosthetic device: Secondary | ICD-10-CM | POA: Insufficient documentation

## 2022-11-19 DIAGNOSIS — C109 Malignant neoplasm of oropharynx, unspecified: Secondary | ICD-10-CM | POA: Diagnosis not present

## 2022-11-19 DIAGNOSIS — Z01818 Encounter for other preprocedural examination: Secondary | ICD-10-CM

## 2022-11-19 DIAGNOSIS — K036 Deposits [accretions] on teeth: Secondary | ICD-10-CM

## 2022-11-19 DIAGNOSIS — K0530 Chronic periodontitis, unspecified: Secondary | ICD-10-CM

## 2022-11-19 DIAGNOSIS — K030 Excessive attrition of teeth: Secondary | ICD-10-CM

## 2022-11-19 DIAGNOSIS — K06020 Generalized gingival recession, unspecified: Secondary | ICD-10-CM

## 2022-11-19 DIAGNOSIS — K045 Chronic apical periodontitis: Secondary | ICD-10-CM

## 2022-11-19 NOTE — Progress Notes (Signed)
[facility] of Dental [facility]     OUTPATIENT CONSULTATION   PLAN/RECOMMENDATIONS     ASSESSMENT: There are no current signs of acute odontogenic infection including abscess, edema or erythema, or suspicious lesion requiring biopsy.   Accretions on teeth, bone loss, chronic apical periodontitis     RECOMMENDATIONS: No dental intervention indicated prior to starting radiation therapy at this time.     PLAN: Discuss case with medical team and coordinate treatment as needed. Return for delivery of scatter protection devices and follow-up after completion of radiation therapy.  Discussed in detail all treatment options and recommendations with the patient and they are agreeable to the plan.   Thank you for consulting with [facility] and for the opportunity to participate in this patient's treatment.  Should you have any questions or concerns, please contact the [facility] at [phone].    Service Date:   11/19/2022  Patient Name:   Alec Gibson Date of Birth:   [birth year] Medical Record Number: [id number]  Referring Provider:         [name], MD   HISTORY OF PRESENT ILLNESS: Alec Gibson is a very pleasant 66 y.o. 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   The patient presents today for a medically necessary dental consultation as part of their pre-head and neck radiation work-up.   DENTAL HISTORY: The patient reports that he does have a dentist he sees regularly. His last visit was about 4 months ago for a deep cleaning.  He states that he was supposed to go back recently, however had to cancel this appointment due to his new diagnosis. He does normally go twice a year. He currently denies any dental/orofacial pain or sensitivity. Patient is able to manage oral secretions.  Patient denies dysphagia,  odynophagia, dysphonia, SOB and neck pain.  Patient denies fever, rigors and malaise.   CHIEF COMPLAINT:  Here for a pre-head and neck radiation dental evaluation.   Patient Active Problem List   Diagnosis Date Noted   Squamous cell carcinoma of oropharynx ([place]) 11/14/2022   No past medical history on file. No past surgical history on file. Allergies  Allergen Reactions   Sulfa Antibiotics    Current Outpatient Medications  Medication Sig Dispense Refill   HYDROcodone-acetaminophen (NORCO/VICODIN) 5-325 MG tablet Take 1 tablet by mouth every 6 (six) hours as needed for moderate pain. 30 tablet 0   No current facility-administered medications for this visit.    LABS:  No results found for: "WBC", "HGB", "HCT", "MCV", "PLT" No results found for: "NA", "K", "CL", "CO2", "GLUCOSE", "BUN", "CREATININE", "CALCIUM", "GFRNONAA", "GFRAA" No results found for: "INR", "PROTIME" No results found for: "PTT"  Social History   Socioeconomic History   Marital status: Divorced    Spouse name: Not on file   Number of children: Not on file   Years of education: Not on file   Highest education level: Not on file  Occupational History   Not on file  Tobacco Use   Smoking status: Never   Smokeless tobacco: Never  Substance and Sexual Activity   Alcohol use: Yes   Drug use: Never   Sexual activity: Not on file  Other Topics Concern   Not on file  Social History Narrative   Not on file   Social Determinants of Health   Financial Resource Strain: Not on file  Food Insecurity: Not  on file  Transportation Needs: Not on file  Physical Activity: Not on file  Stress: Not on file  Social Connections: Not on file  Intimate Partner Violence: Not on file   No family history on file.   REVIEW OF SYSTEMS:  Reviewed with the patient as per HPI. PSYCH:  Patient denies having dental phobia.   VITAL SIGNS:  BP 131/77 (BP Location: Right Arm, Patient Position: Sitting, Cuff Size:  Normal)   Pulse 63   Temp 98.4 F (36.9 C) (Oral)    PHYSICAL EXAM: GENERAL:  Well-developed, comfortable and in no apparent distress. NEUROLOGICAL:  Alert and oriented to person, place and  time. EXTRAORAL:  Facial symmetry present without any edema or erythema.  No swelling or lymphadenopathy.  TMJ asymptomatic without clicks or crepitations.  Maximum Interincisal Opening:  32 mm INTRAORAL:  Soft tissues appear well-perfused and mucous membranes moist.  FOM and vestibules soft and not raised. Oral cavity without mass or lesion. No signs of infection, parulis, sinus tract, edema or erythema evident upon exam. [+] Bilateral mandibular tori   DENTAL EXAM: Hard tissue exam completed and charted.    OVERALL IMPRESSION:  Fair remaining dentition.       ORAL HYGIENE:  Fair    PERIODONTAL:  Pink, healthy gingival tissue with blunted papilla.   Localized plaque accumulation. [+] Recession:  Generalized ENDODONTICS:  #5 and #19 have had previous RCT PROSTHODONTICS: Existing PFM crowns on #3, #5, #19. Existing gold crowns on #2, #16 and #18. OCCLUSION:  Unable to adequately assess molar occlusion due to drifting of teeth (missing posterior molars). Class 2 anterior occlusion. OTHER FINDINGS:   [+] Attrition/wear:  #7-#10 incisal; #23-#26 incisal   RADIOGRAPHIC EXAM:  PAN and Full Mouth Series were exposed and interpreted.   Condyles seated bilaterally in fossas.  No evidence of abnormal pathology.  All visualized osseous structures appear WNL.   Missing teeth #'s 1, 14, 15, 17 & 30.  #16, #31 & #32 are drifting mesial.  Generalized moderate horizontal bone loss consistent with moderate periodontitis.   Existing restorations noted on #4, #7, #11, #12, #13, #20, #29, #31 & #32.  Existing full-coverage crowns/restorations on #'s 2, 3, 5, 16, 18 & 19. Teeth numbers 5 and 19 have been previously endodontically treated; #19 appears to have large core-build-up and signs of external root  resorption vs previous infection (healing) vs reinfection (tooth asymptomatic).  Tooth #3 has small radiolucency on mesial root and possibly palatal root.     ASSESSMENT:  1.  Oropharyngeal cancer 2.  Pre-head and neck radiation dental exam 3.  Missing teeth 4.  Chronic periodontitis 5.  Gingival recession, generalized 6.  Accretions on teeth 7.  Chronic apical periodontitis 8.  Attrition/wear   PROCEDURES: The common and significant side effects of radiation therapy to the head and neck were explained and discussed with the patient.  The discussion included side effects of trismus (limited opening), dysgeusia (loss of taste), xerostomia (dry mouth), radiation caries and osteoradionecrosis of the jaw.  I also discussed the importance of maintaining optimal oral hygiene and oral health before, during and after radiation to decrease the risk of developing radiation cavities and the need for any surgery such as extractions after therapy.    Upper and Lower alginate impressions taken and poured up in [employer] for fabrication of scatter protection devices.   Trismus appliance made using patient's baseline MIO which = 17 sticks.  Leta Speller, DAII demonstrated use of appliance.  Verbal and written postop instructions were given to the patient.   PLAN AND RECOMMENDATIONS: I discussed the risks, benefits, and complications of various scenarios with the patient in relationship to their medical and dental conditions, which included systemic infection or other serious issues such as osteoradionecrosis that could potentially occur either before, during or after their anticipated radiation therapy if dental/oral concerns are not addressed.  I explained that if any chronic or acute dental/oral infection(s) are addressed and subsequently not maintained following medical optimization and recovery, their risk of the previously mentioned complications are just as high and could potentially occur  postoperatively.  I explained all significant findings of the dental consultation and the recommended care including continuing to see his primary dental provider after he finishes treatment in order to optimize them following radiation from a dental standpoint.  The patient verbalized understanding of all findings, discussion, and recommendations. Teeth numbers 3 and 19 have periapical radiolucencies on radiographs and will likely require treatment in the future- 19 is likely not in field of radiation per Dr. Isidore Moos (PET has not been taken yet and patient has not had rad-onc consult yet). Will plan to consult with Dr. Isidore Moos on official radiation plan.  Return for SPD delivery and follow-up after the completion of radiation therapy.  Plan to discuss all findings and recommendations with medical team and coordinate future care as needed.   The patient will need to return to their regular dentist for routine dental care including replacement of missing teeth as needed, cleanings and exams. Call if any questions or concerns arise before next visit.  All questions and concerns were invited and addressed.  The patient tolerated today's visit well and departed in stable condition.    I spent in excess of 120 minutes during the conduct of this consultation and >50% of this time involved direct face-to-face encounter for counseling and/or coordination of the patient's care.  Sandi Mariscal, DMD

## 2022-11-19 NOTE — Patient Instructions (Signed)
[facility] DEPARTMENT OF DENTAL MEDICINE Dr. Jaquila Santelli B. Perle Brickhouse, D.M.D. Phone: [phone] Fax: [phone]        It was a pleasure seeing you today!  Please refer to the information below regarding your dental visit with us.  Please do not hesitate to give us a call if any questions or concerns come up after you leave.    Thank you for letting us provide care for you.  If there is anything we can do for you, please let us know.      RADIATION THERAPY AND INFORMATION REGARDING YOUR TEETH  XEROSTOMIA (dry mouth):  Your salivary glands may be in the field of radiation.  Radiation may include all or only part of your salivary glands.  This will cause your saliva to dry up, and you will have a dry mouth.  The dry mouth will be for the rest of your life unless your radiation oncologist tells you otherwise.   Your saliva has many functions: It wets your tongue for speaking. It coats your teeth and the inside of your mouth for easier movement. It helps with chewing and swallowing food. It helps clean away harmful acid and toxic products made by the germs in your mouth, therefore it helps prevent cavities. It kills some germs in your mouth and helps to prevent gum disease. It helps to carry flavor to your taste buds.  Once you have lost your saliva, you will be at higher risk for tooth decay and gum disease.     What can be done to help improve your mouth when there's not enough saliva? Your dentist may give a recommendation for CLoSYS.  It will not bring back all of your saliva but may bring back some of it.  Also, your saliva may be thick and ropy or white and foamy.  It will not feel like it use to feel. You will need to swish with water every time your mouth feels dry.  YOU CANNOT suck on any cough drops, mints, lemon drops, candy, vitamin C or any other products.  You cannot use anything other than water to make your mouth feel less dry.  If you want to  drink anything else, you have to drink it all at once and brush afterwards.  Be sure to discuss the details of your diet habits with your dentist or hygienist.   RADIATION CARIES:  This is decay (cavities) that happens very quickly  once your mouth is very dry due to radiation therapy.  Normally, cavities take six months to two years to become a problem.  When you have dry mouth, cavities may take as little as eight weeks to cause you a problem.    Dental check-ups every 2-4 months are necessary as long as you have a dry mouth.  Radiation caries typically, but not always, starts at your gum line where it is hard to see the cavity.  It is therefore also hard to fill these cavities adequately.  This high rate of cavities happens because your mouth no longer has saliva and therefore the acid made by the germs starts the decay process.  Whenever you eat anything the germs in your mouth change the food into acid.  The acid then burns a small hole in your tooth.  This small hole is the beginning of a cavity.  If this is not treated then it will grow bigger and become a cavity.  The way to avoid this hole getting bigger   is to use fluoride every evening as prescribed by your dentist following your radiation. NOTE:  You have to make sure that your teeth are very clean before you use the fluoride.  This fluoride in turn will strengthen your teeth and prepare them for another day of fighting acid. If you develop radiation caries many times, the damage is so large that you will have to have all your teeth removed.  This could be a big problem if some of these teeth are in the field of radiation.  Further details of why this could be a big problem will follow (see Osteoradionecrosis below).   DYSGEUSIA (loss of taste):  This happens to varying degrees once you've had radiation therapy to your jaw region.  Many times taste is not completely lost, but becomes limited.  The loss of taste is mostly due to radiation  affecting your taste buds.  However, if you have no saliva in your mouth to carry the flavor to your taste buds, it would be difficult for your taste buds to taste anything.  That is why using water or over-the-counter brand CLoSYS prior to meals and during meal times may help with some of the taste.  Keep in mind that taste generally returns very slowly over the course of several months or several years after radiation therapy.  Don't give up hope.   TRISMUS (limited jaw opening):  According to your radiation oncologist, your TMJ or jaw joints are going to be partially or fully in the field of radiation.  This means that over time the muscles that help you open and close your mouth may get stiff.  This will potentially result in your not being able to open your mouth wide enough or as wide as you can open it now.  Let me give you an example of how slowly this happens and how unaware people are of it: A gentlemen that had radiation therapy 2 years ago went back to his dentist complaining that "bananas were just too large for him to be able to fit them in between his teeth."  He was no longer able to open wide enough to bite into a banana.  This happened slowly and over a period of time.   What we do to try and prevent this:   Your dentist will probably give you a stack of sticks called a trismus exercise device.  This stack will help remind your muscles and your jaw joints to open up to the same distance every day.  Use these sticks every morning when you wake up, or according to the instructions given by your dentist.    You must use these sticks for at least one to two years after radiation therapy.  The reason for that is because it happens so slowly and keeps going on for about two years after radiation therapy.  Your hospital dentist will help you monitor your mouth opening and make sure that it's not getting smaller after radiation.      TRISMUS EXERCISES: Using the stack of sticks given to you by  your dentist, place the stack in your mouth and hold onto the other end for support. Leave the sticks in your mouth while holding the other end.  Allow 30 seconds for muscle stretching. Rest for a few seconds. Repeat 3-5 times. This exercise is recommended in the mornings and evenings unless otherwise instructed. The exercise should be done for a period of 2 YEARS after the end of radiation. Your maximum   jaw opening should be checked regularly at recall dental visits by your general dentist. You should report any changes, soreness, or difficulties encountered when doing the exercises to your dentist.   OSTEORADIONECROSIS (ORN):  This is a condition where your jaw bone after radiation therapy becomes very dry.  It has very little blood supply to keep it alive.  If you develop a cavity that turns into an abscess or an infection, then the jaw bone does not have enough blood supply to help fight the infection.  At this point it is very likely that the infection could cause the death of your jaw bone.  When you have dead bone it has to be removed.  Therefore, you might end up having to have surgery to remove part of your jaw bone, the part of the jaw bone that has been affected.     Healing is also a problem if you are to have surgery (like a tooth extraction) in the areas where the bone has had radiation therapy.  If you have surgery, you need more blood supply to heal which is not available.  When blood supply and oxygen are not available, there is a chance for the bone to die. Occasionally, ORN happens on its own with no obvious reason, but this is quite rare.  We believe that patients who continue to smoke and/or drink alcohol have a higher chance of having this problem. Once your jaw bone has had radiation therapy, if there are any remaining teeth in that area, it is not recommended to have them pulled unless your dentist or oral surgeon is aware of your history of radiation and believes it is safe.   The risks for ORN either from infection or spontaneously occurring (with no reason) are life long.      Questions?  Call our office during office hours at [phone].  

## 2022-11-21 ENCOUNTER — Encounter (HOSPITAL_COMMUNITY)
Admission: RE | Admit: 2022-11-21 | Discharge: 2022-11-21 | Disposition: A | Discharge status: Home / Self Care | Payer: 59 | Source: Ambulatory Visit | Attending: Otolaryngology | Admitting: Otolaryngology

## 2022-11-21 DIAGNOSIS — C109 Malignant neoplasm of oropharynx, unspecified: Secondary | ICD-10-CM | POA: Insufficient documentation

## 2022-11-21 MED ORDER — FLUDEOXYGLUCOSE F - 18 (FDG) INJECTION
8.5700 | Freq: Once | INTRAVENOUS | Status: AC | PRN
  Administered 2022-11-21: 8.57 via INTRAVENOUS

## 2022-11-21 NOTE — Progress Notes (Signed)
Radiation Oncology         [phone] ________________________________  Initial Outpatient Consultation  Name: Alec Gibson MRN: [id number]  Date: 11/22/2022  DOB: [birth year]  OJ:[id number], No Pcp Per  [name], MD   REFERRING PHYSICIAN: [name], MD  DIAGNOSIS: C09.0   ICD-10-CM   1. 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 male who presented to his PCP on 10/23/22 with stabbing right ear pain x 1 month, and a swollen lymph node on the right side of his neck present for at least several months. He also reported that the ear pain radiated into and beneath his jaw, and endorsed pain when opening his mouth and some trouble swallowing. He also had a sinus infection several weeks prior to the onset of these symptoms. Physical exam performed at the time of this visit was notable for erythema and hypertrophy of the right tonsil, and palpable right sided tonsillar lymphadenopathy. His PCP prescribed him Augmentin and recommended an urgent referral to ENT for further evaluation and management.    Subsequently, the patient saw Dr. Sabino Gasser on 10/23/22 who performed a flexible laryngoscopy which revealed a firm tumor based on the right palatine tonsil with extension through the soft palate into the right eustachian tube opening.    Soft tissue neck CT with contrast performed on 10/30/22 (ordered by Dr. Sabino Gasser) revealed a solid mass lesion centered in the right palatine tonsil measuring 3.5 x 2.3 cm,  with effacement of the adjacent parapharyngeal fat, right glossotonsillar sulcus, and likely posteromedial extent along the posterior oropharyngeal wall. CT also showed a morphologically suspicious right level IB lymph node measuring 1.4 cm, and additional suspected level II pathologic lymphadenopathy with short-segment occlusion of the right internal jugular vein in the mid neck.  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   Accordingly, the patient was referred to Dr. Chryl Heck on 11/14/22 to discuss treatment options. In the setting of locally advanced HPV positive oropharyngeal cancers, Dr. Chryl Heck recommends concurrent chemoradiation provided there is no evidence of metastatic disease on his pending PET scan.  The patient is agreeable to proceed with any treatment recommended, and will return to Dr. Chryl Heck following pending PET scan results to initiate treatment.   The patient has already met with dentistry and has been cleared to proceed with radiation therapy.   Pertinent imaging performed thus far includes a PET scan performed yesterday.  I personally reviewed the images with the patient.  This shows a large right oropharyngeal mass and bilateral multistation hypermetabolic lymphadenopathy.  No distant metastatic disease.  Swallowing issues, if any: some dysphagia but this is manageable.  It has actually improved recently.  Weight Changes: He has lost about 10 pounds recently  Pain status: ear pain and pain with opening his jaw as noted below  Other symptoms: pain with opening his mouth, right ear pain, swollen lymph node on the right  side of his neck  Tobacco history, if any: never smoker  ETOH abuse, if any: drinks 1-2 beers per week   Prior cancers, if any: none  PREVIOUS RADIATION THERAPY: No  PAST MEDICAL HISTORY:  has a past medical history of Anxiety,  Depression, and Thyroid disease.    PAST SURGICAL HISTORY:History reviewed. No pertinent surgical history.  FAMILY HISTORY: family history is not on file.  SOCIAL HISTORY:  reports that he has never smoked. He has never used smokeless tobacco. He reports current alcohol use. He reports that he does not use drugs.  ALLERGIES: Sulfa antibiotics  MEDICATIONS:  Current Outpatient Medications  Medication Sig Dispense Refill   cholecalciferol (VITAMIN D3) 25 MCG (1000 UNIT) tablet Take 1,000 Units by mouth daily.     clonazePAM (KLONOPIN) 1 MG tablet Take 1 mg by mouth 2 (two) times daily as needed for anxiety (sleep).     HYDROcodone-acetaminophen (NORCO/VICODIN) 5-325 MG tablet Take 1 tablet by mouth every 6 (six) hours as needed for moderate pain. 30 tablet 0   levocetirizine (XYZAL) 5 MG tablet Take 5 mg by mouth every evening.     levothyroxine (SYNTHROID) 150 MCG tablet Take 150 mcg by mouth daily before breakfast.     vitamin E 200 UNIT capsule Take 200 Units by mouth daily.     zinc gluconate 50 MG tablet Take 50 mg by mouth daily.     No current facility-administered medications for this encounter.    REVIEW OF SYSTEMS:  Notable for that above.   PHYSICAL EXAM:  height is _0  (1.753 m) and weight is 155 lb 2 oz (70.4 kg). His temporal temperature is 97 F (36.1 C) (abnormal). His blood pressure is 126/79 and his pulse is 74. His respiration is 18 and oxygen saturation is 98%.   General: Alert and oriented, in no acute distress HEENT: Head is normocephalic. Extraocular movements are intact. Oropharynx is notable for tongue deviates slightly to the right.  Uvula deviates significantly to the right.  There appears to be tumor originating from the right tonsil. Neck: Neck is notable for nonbulky palpable adenopathy at right level 1B and left level II Heart: Regular in rate and rhythm with no murmurs, rubs, or gallops. Chest: Clear to auscultation bilaterally, with no rhonchi,  wheezes, or rales. Abdomen: Soft, nontender, nondistended, with no rigidity or guarding. Extremities: No cyanosis or edema. Lymphatics: see Neck Exam Skin: No concerning lesions. Musculoskeletal: symmetric strength and muscle tone throughout. Neurologic: Cranial nerves II through XII are grossly intact. No obvious focalities. Speech is fluent. Coordination is intact. Psychiatric: Judgment and insight are intact. Affect is appropriate.   ECOG = 1  0 - Asymptomatic (Fully active, able to carry on all predisease activities without restriction)  1 - Symptomatic but completely ambulatory (Restricted in physically strenuous activity but ambulatory and able to carry out work of a light or sedentary nature. For example, light housework, office work)  2 - Symptomatic, <50% in bed during the day (Ambulatory and capable of all self care but unable to carry out any work activities. Up and about more than 50% of waking hours)  3 - Symptomatic, >50% in bed, but not bedbound (Capable of only limited self-care, confined to bed or chair 50% or more of waking hours)  4 - Bedbound (Completely disabled. Cannot carry on any self-care. Totally confined to bed or chair)  5 - Death   Eustace Pen MM, Creech RH, Tormey DC, et al. [phone]). "Toxicity and response criteria  of the [facility]". [place] Oncol. 5 (6): 649-55   LABORATORY DATA:  No results found for: "WBC", "HGB", "HCT", "MCV", "PLT" CMP  No results found for: "NA", "K", "CL", "CO2", "GLUCOSE", "BUN", "CREATININE", "CALCIUM", "PROT", "ALBUMIN", "AST", "ALT", "ALKPHOS", "BILITOT", "GFRNONAA", "GFRAA"    No results found for: "TSH"   RADIOGRAPHY: NM PET Image Initial (PI) Skull Base To Thigh (F-18 FDG)  Result Date: 11/22/2022 CLINICAL DATA:  Initial treatment strategy for oropharyngeal squamous cell carcinoma. EXAM: NUCLEAR MEDICINE PET SKULL BASE TO THIGH TECHNIQUE: 8.57 mCi F-18 FDG was injected intravenously. Full-ring PET  imaging was performed from the skull base to thigh after the radiotracer. CT data was obtained and used for attenuation correction and anatomic localization. Fasting blood glucose: 103 mg/dl COMPARISON:  Neck CT 10/30/2022 FINDINGS: Mediastinal blood pool activity: SUV max 1.99 Liver activity: SUV max NA NECK: Large right-sided oropharyngeal mass beginning in the right palatine tonsillar area and extending all the way down to the supraglottic area. This invades the right parapharyngeal space. SUV max is 11.87. Associated extensive multilevel bilateral neck adenopathy as demonstrated on the CT scan. The right level 1B node has an SUV max of 7.72. Left-sided level 2 adenopathy has an SUV max of 9.84. Small right supraclavicular node measures 7 mm with SUV max of 4.52. Incidental CT findings: None. CHEST: No hypermetabolic mediastinal or hilar nodes. No suspicious pulmonary nodules on the CT scan. Incidental CT findings: Minimal scattered aortic calcifications. No definite coronary artery calcifications. Slightly prominent sternalis muscles are noted. ABDOMEN/PELVIS: No abnormal hypermetabolic activity within the liver, pancreas, adrenal glands, or spleen. No hypermetabolic lymph nodes in the abdomen or pelvis. Incidental CT findings: Scattered atherosclerotic calcifications involving the aorta but no aneurysm. SKELETON: No focal hypermetabolic activity to suggest skeletal metastasis. Incidental CT findings: None. IMPRESSION: 1. Large hypermetabolic right-sided oropharyngeal mass consistent with known squamous cell neoplasm. Associated extensive bilateral multistation hypermetabolic lymphadenopathy. 2. No findings for metastatic disease involving the chest, abdomen/pelvis or bony structures. Electronically Signed   By: Marijo Sanes M.D.   On: 11/22/2022 08:10   CT SOFT TISSUE NECK W CONTRAST  Result Date: 10/30/2022 CLINICAL DATA:  Oropharyngeal neoplasm. EXAM: CT NECK WITH CONTRAST TECHNIQUE: Multidetector CT  imaging of the neck was performed using the standard protocol following the bolus administration of intravenous contrast. RADIATION DOSE REDUCTION: This exam was performed according to the departmental dose-optimization program which includes automated exposure control, adjustment of the mA and/or kV according to patient size and/or use of iterative reconstruction technique. CONTRAST:  67m OMNIPAQUE IOHEXOL 300 MG/ML  SOLN COMPARISON:  None Available. FINDINGS: Pharynx and larynx: The nasal cavity and nasopharynx are unremarkable. Evaluation of the oral cavity and oropharynx is degraded by streak artifact from dental amalgam. Within this confine: There is asymmetric fullness of the right palatine tonsil with a solid mass lesion measuring approximately 3.5 cm x 2.3 cm in the axial plane (3-36). There is effacement of the adjacent parapharyngeal fat at this level and slight asymmetric effacement of the right glossotonsillar sulcus and likely posteromedial extent along the posterior oropharyngeal wall (3-38). The lesion does not definitely cross midline to involve the left tonsillar region. The oral tongue and floor of mouth appear normal. The base of tongue is normal. The hypopharynx and larynx are unremarkable. The vocal folds are normal in appearance. There is no retropharyngeal fluid collection. Salivary glands: The parotid and submandibular glands are unremarkable. Thyroid: Unremarkable. Lymph nodes: There is a 1.4 cm right  level IB lymph node which is morphologically suspicious but without evidence of extracapsular extension (3-61). The right internal jugular vein is occluded in the mid neck, suspected due to lymph nodes/lymph node conglomerate (8-44, 8-50)., though this is significantly suboptimally evaluated due to streak artifact. Vascular: There is calcified plaque at the carotid bifurcations, right worse than left. As above, there is short-segment occlusion of the right internal jugular vein. The major  vasculature of the neck is otherwise unremarkable. Limited intracranial: The imaged portions of the intracranial compartment are unremarkable. Visualized orbits: The globes and orbits are unremarkable. Mastoids and visualized paranasal sinuses: Clear. Skeleton: There is no acute osseous abnormality or suspicious osseous lesion. Upper chest: The imaged lung apices are clear. Other: None. IMPRESSION: 1. Approximately 3.5 cm x 2.3 cm solid mass lesion centered in the right palatine tonsil with effacement of the adjacent parapharyngeal fat, right glossotonsillar sulcus, and likely posteromedial extent along the posterior oropharyngeal wall consistent with the provided history of oropharyngeal neoplasm. 2. 1.4 cm morphologically suspicious right level IB lymph node, and additional suspected pathologic lymphadenopathy at level II with short-segment occlusion of the right internal jugular vein in the mid neck, suboptimally evaluated due to streak artifact from dental amalgam. Electronically Signed   By: Valetta Mole M.D.   On: 10/30/2022 15:36      IMPRESSION/PLAN:  This is a delightful patient with head and neck cancer, HPV positive, right tonsil, involving a significant portion of the nasopharynx. I  recommend radiotherapy for this patient.  Anticipate 7 weeks of radiation therapy to the throat and bilateral neck with concurrent chemotherapy.  He has been discussed in detail at our ENT tumor board.  We discussed the potential risks, benefits, and side effects of radiotherapy. We talked in detail about acute and late effects. We discussed that some of the most bothersome acute effects may be mucositis, dysgeusia, salivary changes, skin irritation, hair loss, dehydration, weight loss and fatigue. We talked about late effects which include but are not necessarily limited to dysphagia, hypothyroidism, nerve injury, vascular injury, spinal cord injury, xerostomia, trismus, neck edema, and potential injury to any of the  tissues in the head and neck region. No guarantees of treatment were given. A consent form was signed and placed in the patient's medical record. The patient is enthusiastic about proceeding with treatment. I look forward to participating in the patient's care.    Simulation (treatment planning) will take place on December 18  We also discussed that the treatment of head and neck cancer is a multidisciplinary process to maximize treatment outcomes and quality of life. For this reason the following referrals have been or will be made:   Medical oncology to discuss chemotherapy    Dentistry for dental evaluation, possible extractions in the radiation fields, and /or advice on reducing risk of cavities, osteoradionecrosis, or other oral issues.   Nutritionist for nutrition support during and after treatment.   Speech language pathology for swallowing and/or speech therapy.   Social work for social support.    Physical therapy due to risk of lymphedema in neck and deconditioning.   Baseline labs including TSH.  On date of service, in total, I spent 60 minutes on this encounter. Patient was seen in person.  __________________________________________   [name], MD  This document serves as a record of services personally performed by [name], MD. It was created on her behalf by Roney Mans, a trained medical scribe. The creation of this record is based on the  scribe's personal observations and the provider's statements to them. This document has been checked and approved by the attending provider.

## 2022-11-22 ENCOUNTER — Other Ambulatory Visit: Payer: Self-pay

## 2022-11-22 ENCOUNTER — Ambulatory Visit (INDEPENDENT_AMBULATORY_CARE_PROVIDER_SITE_OTHER): Payer: 59 | Admitting: Dentistry

## 2022-11-22 ENCOUNTER — Ambulatory Visit
Admission: RE | Admit: 2022-11-22 | Discharge: 2022-11-22 | Disposition: A | Discharge status: Home / Self Care | Payer: 59 | Source: Ambulatory Visit | Attending: Radiation Oncology | Admitting: Radiation Oncology

## 2022-11-22 ENCOUNTER — Encounter: Payer: Self-pay | Admitting: Radiation Oncology

## 2022-11-22 VITALS — BP 115/80 | HR 52 | Temp 98.1°F

## 2022-11-22 VITALS — BP 126/79 | HR 74 | Temp 97.0°F | Resp 18 | Ht 69.0 in | Wt 155.1 lb

## 2022-11-22 DIAGNOSIS — Z463 Encounter for fitting and adjustment of dental prosthetic device: Secondary | ICD-10-CM

## 2022-11-22 DIAGNOSIS — I6523 Occlusion and stenosis of bilateral carotid arteries: Secondary | ICD-10-CM | POA: Diagnosis not present

## 2022-11-22 DIAGNOSIS — I82C11 Acute embolism and thrombosis of right internal jugular vein: Secondary | ICD-10-CM | POA: Insufficient documentation

## 2022-11-22 DIAGNOSIS — C109 Malignant neoplasm of oropharynx, unspecified: Secondary | ICD-10-CM

## 2022-11-22 DIAGNOSIS — Z79890 Hormone replacement therapy (postmenopausal): Secondary | ICD-10-CM | POA: Diagnosis not present

## 2022-11-22 DIAGNOSIS — C090 Malignant neoplasm of tonsillar fossa: Secondary | ICD-10-CM

## 2022-11-22 DIAGNOSIS — R1310 Dysphagia, unspecified: Secondary | ICD-10-CM | POA: Diagnosis not present

## 2022-11-22 HISTORY — DX: Depression, unspecified: F32.A

## 2022-11-22 HISTORY — DX: Anxiety disorder, unspecified: F41.9

## 2022-11-22 HISTORY — DX: Disorder of thyroid, unspecified: E07.9

## 2022-11-22 NOTE — Progress Notes (Signed)
Oncology Nurse Navigator Documentation   Met with patient during initial consult with Dr. Isidore Moos. Further introduced myself as his/their Navigator, explained my role as a member of the Care Team. Assisted with post-consult appt scheduling. I walked with him to Dr. Raynelle Dick office to get his SPD's fitted. He is aware that he will get a phone call to schedule his CT simulation/radiation planning.  He verbalized understanding of information provided. I encouraged him to call with questions/concerns moving forward.  Harlow Asa, RN, BSN, OCN Head & Neck Oncology Nurse [place] at [place] [phone]

## 2022-11-22 NOTE — Progress Notes (Signed)
  [facility]     TODAY'S VISIT:   DELIVERY: INTRAORAL APPLIANCE     PROCEDURES: Delivered upper+lower scatter protection devices.     PLAN: Follow-up after the completion of radiation therapy. Call if any questions or concerns arise before next visit.    Service Date:   11/22/2022  Patient Name:   Alec Gibson Date of Birth:   Sep 13, 1956 Medical Record Number: [id number]   HISTORY OF PRESENT ILLNESS: Alec Gibson presents today for delivery of upper and lower scatter protection devices. Medical and dental history were reviewed with the patient.  His radiation simulation is scheduled for Monday (12/18).   CHIEF COMPLAINT:   Here for a routine dental appointment; patient with no complaints.  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     clonazePAM (KLONOPIN) 1 MG tablet Take 1 mg by mouth 2 (two) times daily as needed for anxiety (sleep).     HYDROcodone-acetaminophen (NORCO/VICODIN) 5-325 MG tablet Take 1 tablet by mouth every 6 (six) hours as needed for moderate pain. 30 tablet 0   levocetirizine (XYZAL) 5 MG tablet Take 5 mg by mouth every evening.     levothyroxine (SYNTHROID) 150 MCG tablet Take 150 mcg by mouth daily before breakfast.     vitamin E 200 UNIT capsule Take 200 Units by mouth daily.     zinc gluconate 50 MG tablet Take 50 mg by  mouth daily.     No current facility-administered medications for this visit.   Allergies  Allergen Reactions   Sulfa Antibiotics      VITALS:  BP 115/80 (BP Location: Right Arm, Patient Position: Sitting, Cuff Size: Normal)   Pulse (!) 52   Temp 98.1 F (36.7 C) (Oral)    ASSESSMENT/INDICATION(S):  Patient with anticipated radiation therapy to the head and neck region.   PROCEDURES: Delivery of upper and lower scatter protection devices. Appliances were tried in and adjusted as needed.  Polished. Postoperative instructions were provided in a written and verbal format concerning the use and care of appliances.   PLAN/RECOMMENDATIONS: Follow-up after radiation therapy. Call if any questions or concerns arise before next visit.  All questions and concerns were invited and addressed.  The patient tolerated today's visit well and departed in stable condition.  -Sandi Mariscal, DMD

## 2022-11-23 ENCOUNTER — Other Ambulatory Visit: Payer: Self-pay | Admitting: Hematology and Oncology

## 2022-11-23 DIAGNOSIS — C109 Malignant neoplasm of oropharynx, unspecified: Secondary | ICD-10-CM

## 2022-11-23 MED ORDER — LIDOCAINE-PRILOCAINE 2.5-2.5 % EX CREA: TOPICAL_CREAM | CUTANEOUS | 3 refills | Status: DC

## 2022-11-23 MED ORDER — ONDANSETRON HCL 8 MG PO TABS: 8.0000 mg | ORAL_TABLET | Freq: Three times a day (TID) | ORAL | 1 refills | Status: DC | PRN

## 2022-11-23 MED ORDER — DEXAMETHASONE 4 MG PO TABS: ORAL_TABLET | ORAL | 1 refills | Status: DC

## 2022-11-23 MED ORDER — PROCHLORPERAZINE MALEATE 10 MG PO TABS: 10.0000 mg | ORAL_TABLET | Freq: Four times a day (QID) | ORAL | 1 refills | Status: DC | PRN

## 2022-11-23 NOTE — Progress Notes (Signed)
Chemo plan placed. Will need chemo education.

## 2022-11-23 NOTE — Progress Notes (Signed)
START ON PATHWAY REGIMEN - Head and Neck     A cycle is every 7 days:     Cisplatin   **Always confirm dose/schedule in your pharmacy ordering system**  Patient Characteristics: Oropharynx, HPV Positive, Preoperative or Nonsurgical Candidate (Clinical Staging), cT0-4, cN1-3 or cT3-4, cN0 Disease Classification: Oropharynx HPV Status: Positive (+) Therapeutic Status: Preoperative or Nonsurgical Candidate (Clinical Staging) AJCC T Category: cT4 AJCC 8 Stage Grouping: III AJCC N Category: cN2 AJCC M Category: cM0 Intent of Therapy: Curative Intent, Discussed with Patient

## 2022-11-24 ENCOUNTER — Other Ambulatory Visit: Payer: Self-pay

## 2022-11-25 ENCOUNTER — Ambulatory Visit
Admission: RE | Admit: 2022-11-25 | Discharge: 2022-11-25 | Disposition: A | Discharge status: Home / Self Care | Payer: 59 | Source: Ambulatory Visit | Attending: Radiation Oncology | Admitting: Radiation Oncology

## 2022-11-25 ENCOUNTER — Other Ambulatory Visit: Payer: Self-pay

## 2022-11-25 ENCOUNTER — Ambulatory Visit
Admission: RE | Admit: 2022-11-25 | Discharge: 2022-11-25 | Disposition: A | Discharge status: Home / Self Care | Payer: 59 | Source: Ambulatory Visit

## 2022-11-25 DIAGNOSIS — Z79899 Other long term (current) drug therapy: Secondary | ICD-10-CM | POA: Insufficient documentation

## 2022-11-25 DIAGNOSIS — C090 Malignant neoplasm of tonsillar fossa: Secondary | ICD-10-CM | POA: Insufficient documentation

## 2022-11-25 DIAGNOSIS — C109 Malignant neoplasm of oropharynx, unspecified: Secondary | ICD-10-CM

## 2022-11-25 DIAGNOSIS — Z1329 Encounter for screening for other suspected endocrine disorder: Secondary | ICD-10-CM

## 2022-11-25 LAB — BUN & CREATININE (CHCC)
BUN: 22 mg/dL (ref 8–23)
Creatinine: 1 mg/dL (ref 0.61–1.24)
GFR, Estimated: >60 mL/min (ref >60)

## 2022-11-25 LAB — TSH: TSH: 25.355 u[IU]/mL — ABNORMAL HIGH (ref 0.350–4.500)

## 2022-11-25 MED ORDER — SODIUM CHLORIDE FLUSH 0.9 % IV SOLN
3.0000 mL | Freq: Once | INTRAVENOUS | Status: AC
  Administered 2022-11-25: 3 mL via INTRAVENOUS
  Filled 2022-11-25: qty 3

## 2022-11-25 NOTE — Progress Notes (Signed)
Has armband been applied?  Yes.    Does patient have an allergy to IV contrast dye?: No.   Has patient ever received premedication for IV contrast dye?: No.   Does patient take metformin?: No.  If patient does take metformin when was the last dose: na  Date of lab work: 11-25-22 BUN: 22 CR: 1.0 eGfr: greater than 60  IV site: right ac  Has IV site been added to flowsheet?  Yes.    There were no vitals taken for this visit.

## 2022-11-25 NOTE — Progress Notes (Signed)
Oncology Nurse Navigator Documentation   To provide support, encouragement and care continuity, met with Alec Gibson before and during his CT SIM.  He tolerated procedure without difficulty, denied questions/concerns.   I encouraged him to call me prior to  1/2/24[place].  He is aware that he will be scheduled for chemotherapy education and his lab/MD/chemotherapy appointments will be scheduled soon as well.  Harlow Asa RN, BSN, OCN Head & Neck Oncology Nurse [place] at [facility] Phone # [phone]  Fax # [phone]

## 2022-11-26 ENCOUNTER — Encounter: Payer: Self-pay | Admitting: Hematology and Oncology

## 2022-11-26 ENCOUNTER — Inpatient Hospital Stay: Payer: 59

## 2022-11-27 ENCOUNTER — Other Ambulatory Visit (HOSPITAL_COMMUNITY): Payer: 59

## 2022-11-27 ENCOUNTER — Encounter (HOSPITAL_COMMUNITY): Payer: Self-pay

## 2022-11-28 ENCOUNTER — Ambulatory Visit: Payer: 59 | Admitting: Hematology and Oncology

## 2022-11-29 DIAGNOSIS — C109 Malignant neoplasm of oropharynx, unspecified: Secondary | ICD-10-CM | POA: Diagnosis not present

## 2022-12-03 ENCOUNTER — Other Ambulatory Visit: Payer: Self-pay

## 2022-12-03 DIAGNOSIS — C109 Malignant neoplasm of oropharynx, unspecified: Secondary | ICD-10-CM

## 2022-12-04 ENCOUNTER — Telehealth: Payer: Self-pay

## 2022-12-04 NOTE — Telephone Encounter (Signed)
Rn Anderson Malta called pt to let him know that he would need to come in at 8am to the lab on 12-10-22 for a free T4 to be drawn. He was understanding and will come in prior to treatment at 8:15am that morning.

## 2022-12-10 ENCOUNTER — Inpatient Hospital Stay: Payer: Medicare Other | Attending: Hematology and Oncology | Admitting: Hematology and Oncology

## 2022-12-10 ENCOUNTER — Inpatient Hospital Stay: Payer: Medicare Other

## 2022-12-10 ENCOUNTER — Ambulatory Visit
Admission: RE | Admit: 2022-12-10 | Discharge: 2022-12-10 | Disposition: A | Discharge status: Home / Self Care | Payer: Medicare Other | Source: Ambulatory Visit | Attending: Radiation Oncology | Admitting: Radiation Oncology

## 2022-12-10 ENCOUNTER — Encounter: Payer: Self-pay | Admitting: Hematology and Oncology

## 2022-12-10 ENCOUNTER — Other Ambulatory Visit: Payer: Self-pay

## 2022-12-10 ENCOUNTER — Ambulatory Visit: Payer: Medicare Other

## 2022-12-10 DIAGNOSIS — C109 Malignant neoplasm of oropharynx, unspecified: Secondary | ICD-10-CM

## 2022-12-10 DIAGNOSIS — Z79891 Long term (current) use of opiate analgesic: Secondary | ICD-10-CM | POA: Insufficient documentation

## 2022-12-10 DIAGNOSIS — G893 Neoplasm related pain (acute) (chronic): Secondary | ICD-10-CM | POA: Diagnosis not present

## 2022-12-10 DIAGNOSIS — Z510 Encounter for antineoplastic radiation therapy: Secondary | ICD-10-CM | POA: Diagnosis not present

## 2022-12-10 DIAGNOSIS — R1310 Dysphagia, unspecified: Secondary | ICD-10-CM | POA: Insufficient documentation

## 2022-12-10 DIAGNOSIS — Z79899 Other long term (current) drug therapy: Secondary | ICD-10-CM | POA: Diagnosis not present

## 2022-12-10 DIAGNOSIS — R634 Abnormal weight loss: Secondary | ICD-10-CM | POA: Insufficient documentation

## 2022-12-10 DIAGNOSIS — Z5111 Encounter for antineoplastic chemotherapy: Secondary | ICD-10-CM | POA: Diagnosis present

## 2022-12-10 DIAGNOSIS — C090 Malignant neoplasm of tonsillar fossa: Secondary | ICD-10-CM | POA: Diagnosis not present

## 2022-12-10 DIAGNOSIS — Z1329 Encounter for screening for other suspected endocrine disorder: Secondary | ICD-10-CM

## 2022-12-10 LAB — RAD ONC ARIA SESSION SUMMARY
Course Elapsed Days: 0
Plan Fractions Treated to Date: 1
Plan Prescribed Dose Per Fraction: 2 Gy
Plan Total Fractions Prescribed: 35
Plan Total Prescribed Dose: 70 Gy
Reference Point Dosage Given to Date: 2 Gy
Reference Point Session Dosage Given: 2 Gy
Session Number: 1

## 2022-12-10 MED ORDER — HYDROCODONE-ACETAMINOPHEN 5-325 MG PO TABS: 1.0000 | ORAL_TABLET | Freq: Four times a day (QID) | ORAL | 0 refills | Status: DC | PRN

## 2022-12-10 MED FILL — Fosaprepitant Dimeglumine For IV Infusion 150 MG (Base Eq): INTRAVENOUS | Qty: 5 | Status: AC

## 2022-12-10 MED FILL — Dexamethasone Sodium Phosphate Inj 100 MG/10ML: INTRAMUSCULAR | Qty: 1 | Status: AC

## 2022-12-10 NOTE — Progress Notes (Signed)
[name] CONSULT NOTE  Patient Care Team: [name], MD as PCP - General (Hematology and Oncology) [name], [name], RN as Oncology Nurse Navigator [name], MD as Consulting Physician (Hematology and Oncology) [name], MD as Consulting Physician (Radiation Oncology) [name], MD as Consulting Physician (Otolaryngology)  CHIEF COMPLAINTS/PURPOSE OF CONSULTATION:  SCC oropharynx  ASSESSMENT & PLAN:   #1 This is a very pleasant 67 year old healthy male patient with no significant past medical history newly diagnosed with squamous cell carcinoma both sides, locally advanced with ipsilateral and possibly contralateral lymphadenopathy as well presented to medical oncology for recommendations.  Given the invasion of skeletal muscle, he may be actually a T4 N2 M0 staging so far.   PET on 12/14 no evidence of metastatic disease.Large hypermetabolic right-sided oropharyngeal mass consistent with known squamous cell neoplasm. Associated extensive bilateral multistation hypermetabolic lymphadenopathy. We have discussed that for locally advanced HPV positive oropharyngeal cancers, primary treatment would be to recommend concurrent chemoradiation provided there is no evidence of metastatic disease. I have discussed about the regimen, schedule, adverse effects history significant for chest pain ototoxicity, toxicity, peripheral neuropathy.  He understands that some of the side effects can be life-threatening and permanent. He started his first day of radiation today.  Anticipate initiation of infusion tomorrow but patient appears to be continues with appointments.  I have asked the schedulers to please clarify his chemotherapy appointments.  He will need labs before his chemotherapy.  Apparently he was not arrived to the lab properly hence he did not have his labs done today.  #2 cancer related pain, patient has been using hydrocodone once a day.  Refill was  sent requested.  #3  Unintentional weight loss, lost 7 pounds of weight.  I encouraged him to drink Ensure.  He also has a nutrition visit tomorrow.  Please do not hesitate to contact us with any additional questions or concerns  HISTORY OF PRESENTING ILLNESS:  Alec Gibson 67 y.o. 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  Chronology  Alec Gibson is a 67 year old male patient no significant medical history who first noticed pain in the right side of his jaw as well as his ER about 3 to 4 months ago.  He used to notice it when he suddenly moves his head towards the right side but did not think much of it.  About for the past month and a half he had excruciating episodes of pain.  He describes this pain as it was not similar job and moves to his ear followed by severe pain in the neck.   The pain is so intense that he has to moan at times.   He got some medication from Dr. Sabino Gasser hydrocodone which she has been taking as needed, he does not really need 1 of this again.  Other than this pain, he denies any new complaints.  He is able to swallow everything.  He had CT imaging on 10/30/2022 which showed 3.5 x 2.3 cm solid mass lesion centered in the right Tonsil with effacement of the edges Glossotonsillar sulcus and likely posteromedial extent along the posterior oropharyngeal wall consistent with provided history of oropharyngeal neoplasm.  There is also a 1.4 cm morphologically suspicious right level 1B lymph node and additional suspected pathologic lymphadenopathy at level 2 with short segment occlusion of right internal jugular vein in the mid neck suboptimally evaluated due to streak artifact from dental amalgam. PET scan with no metastatic disease.  He started radiation today. He  will start chemo tomorrow. He has been using hydrocodone as needed for jaw pain and ear ache, about once a day. He has high pitched ringing in the ears both sides. He is a bit confused about the appointment schedule. Port  and PEG tube scheduled for 12/13/2022 Rest of the pertinent 10 point ROS reviewed and negative    MEDICAL HISTORY:  Past Medical History:  Diagnosis Date   Anxiety    Depression    Thyroid disease     SURGICAL HISTORY: No past surgical history on file.  SOCIAL HISTORY: Social History   Socioeconomic History   Marital status: Divorced    Spouse name: Not on file   Number of children: Not on file   Years of education: Not on file   Highest education level: Not on file  Occupational History   Not on file  Tobacco Use   Smoking status: Never   Smokeless tobacco: Never  Substance and Sexual Activity   Alcohol use: Yes   Drug use: Never   Sexual activity: Not on file  Other Topics Concern   Not on file  Social History Narrative   Not on file   Social Determinants of Health   Financial Resource Strain: Not on file  Food Insecurity: Not on file  Transportation Needs: Not on file  Physical Activity: Not on file  Stress: Not on file  Social Connections: Not on file  Intimate Partner Violence: Not on file    FAMILY HISTORY: No family history on file.  ALLERGIES:  is allergic to sulfa antibiotics.  MEDICATIONS:  Current Outpatient Medications  Medication Sig Dispense Refill   cholecalciferol (VITAMIN D3) 25 MCG (1000 UNIT) tablet Take 1,000 Units by mouth daily.     clonazePAM (KLONOPIN) 1 MG tablet Take 1 mg by mouth 2 (two) times daily as needed for anxiety (sleep).     dexamethasone (DECADRON) 4 MG tablet Take 2 tablets daily x 3 days starting the day after cisplatin chemotherapy. Take with food. 30 tablet 1   HYDROcodone-acetaminophen (NORCO/VICODIN) 5-325 MG tablet Take 1 tablet by mouth every 6 (six) hours as needed for moderate pain. 30 tablet 0   levocetirizine (XYZAL) 5 MG tablet Take 5 mg by mouth every evening.     levothyroxine (SYNTHROID) 150 MCG tablet Take 150 mcg by mouth daily before breakfast.     lidocaine-prilocaine (EMLA) cream Apply to affected  area once 30 g 3   ondansetron (ZOFRAN) 8 MG tablet Take 1 tablet (8 mg total) by mouth every 8 (eight) hours as needed for nausea or vomiting. Start on the third day after cisplatin. 30 tablet 1   prochlorperazine (COMPAZINE) 10 MG tablet Take 1 tablet (10 mg total) by mouth every 6 (six) hours as needed (Nausea or vomiting). 30 tablet 1   vitamin E 200 UNIT capsule Take 200 Units by mouth daily.     zinc gluconate 50 MG tablet Take 50 mg by mouth daily.     No current facility-administered medications for this visit.     PHYSICAL EXAMINATION: ECOG PERFORMANCE STATUS: 1 - Symptomatic but completely ambulatory  Vitals:   12/10/22 0948  Pulse: 60  Resp: 18  Temp: 98.1 F (36.7 C)  SpO2: 97%    Filed Weights   12/10/22 0948  Weight: 152 lb 9.6 oz (69.2 kg)     GENERAL:alert, no distress and comfortable Bilateral upper cervical lymphadenopathy palpable.  No other changes. LABORATORY DATA:  I have reviewed the data as listed  No results found for: "WBC", "HGB", "HCT", "MCV", "PLT"   Chemistry      Component Value Date/Time   BUN 22 11/25/2022 0834   CREATININE 1.00 11/25/2022 0834   No results found for: "CALCIUM", "ALKPHOS", "AST", "ALT", "BILITOT"     RADIOGRAPHIC STUDIES: I have personally reviewed the radiological images as listed and agreed with the findings in the report. NM PET Image Initial (PI) Skull Base To Thigh (F-18 FDG)  Result Date: 11/22/2022 CLINICAL DATA:  Initial treatment strategy for oropharyngeal squamous cell carcinoma. EXAM: NUCLEAR MEDICINE PET SKULL BASE TO THIGH TECHNIQUE: 8.57 mCi F-18 FDG was injected intravenously. Full-ring PET imaging was performed from the skull base to thigh after the radiotracer. CT data was obtained and used for attenuation correction and anatomic localization. Fasting blood glucose: 103 mg/dl COMPARISON:  Neck CT 10/30/2022 FINDINGS: Mediastinal blood pool activity: SUV max 1.99 Liver activity: SUV max NA NECK: Large  right-sided oropharyngeal mass beginning in the right palatine tonsillar area and extending all the way down to the supraglottic area. This invades the right parapharyngeal space. SUV max is 11.87. Associated extensive multilevel bilateral neck adenopathy as demonstrated on the CT scan. The right level 1B node has an SUV max of 7.72. Left-sided level 2 adenopathy has an SUV max of 9.84. Small right supraclavicular node measures 7 mm with SUV max of 4.52. Incidental CT findings: None. CHEST: No hypermetabolic mediastinal or hilar nodes. No suspicious pulmonary nodules on the CT scan. Incidental CT findings: Minimal scattered aortic calcifications. No definite coronary artery calcifications. Slightly prominent sternalis muscles are noted. ABDOMEN/PELVIS: No abnormal hypermetabolic activity within the liver, pancreas, adrenal glands, or spleen. No hypermetabolic lymph nodes in the abdomen or pelvis. Incidental CT findings: Scattered atherosclerotic calcifications involving the aorta but no aneurysm. SKELETON: No focal hypermetabolic activity to suggest skeletal metastasis. Incidental CT findings: None. IMPRESSION: 1. Large hypermetabolic right-sided oropharyngeal mass consistent with known squamous cell neoplasm. Associated extensive bilateral multistation hypermetabolic lymphadenopathy. 2. No findings for metastatic disease involving the chest, abdomen/pelvis or bony structures. Electronically Signed   By: Marijo Sanes M.D.   On: 11/22/2022 08:10    All questions were answered. The patient knows to call the clinic with any problems, questions or concerns. I spent 30 minutes in the care of this patient including H and P, review of records, counseling and coordination of care.     [name], MD 12/10/2022 10:01 AM

## 2022-12-11 ENCOUNTER — Encounter: Payer: Self-pay | Admitting: Hematology and Oncology

## 2022-12-11 ENCOUNTER — Inpatient Hospital Stay: Payer: Medicare Other

## 2022-12-11 ENCOUNTER — Other Ambulatory Visit: Payer: Self-pay

## 2022-12-11 ENCOUNTER — Inpatient Hospital Stay: Payer: Medicare Other | Admitting: Dietician

## 2022-12-11 ENCOUNTER — Ambulatory Visit
Admission: RE | Admit: 2022-12-11 | Discharge: 2022-12-11 | Disposition: A | Discharge status: Home / Self Care | Payer: Medicare Other | Source: Ambulatory Visit | Attending: Radiation Oncology | Admitting: Radiation Oncology

## 2022-12-11 VITALS — BP 129/81 | HR 73 | Temp 98.3°F | Resp 18 | Wt 152.2 lb

## 2022-12-11 DIAGNOSIS — C090 Malignant neoplasm of tonsillar fossa: Secondary | ICD-10-CM | POA: Diagnosis not present

## 2022-12-11 DIAGNOSIS — Z5111 Encounter for antineoplastic chemotherapy: Secondary | ICD-10-CM | POA: Diagnosis not present

## 2022-12-11 DIAGNOSIS — Z510 Encounter for antineoplastic radiation therapy: Secondary | ICD-10-CM | POA: Diagnosis not present

## 2022-12-11 DIAGNOSIS — C109 Malignant neoplasm of oropharynx, unspecified: Secondary | ICD-10-CM

## 2022-12-11 LAB — RAD ONC ARIA SESSION SUMMARY
Course Elapsed Days: 1
Plan Fractions Treated to Date: 2
Plan Prescribed Dose Per Fraction: 2 Gy
Plan Total Fractions Prescribed: 35
Plan Total Prescribed Dose: 70 Gy
Reference Point Dosage Given to Date: 4 Gy
Reference Point Session Dosage Given: 2 Gy
Session Number: 2

## 2022-12-11 LAB — CBC WITH DIFFERENTIAL (CANCER CENTER ONLY)
Abs Immature Granulocytes: 0.02 10*3/uL (ref 0.00–0.07)
Basophils Absolute: 0 10*3/uL (ref 0.0–0.1)
Basophils Relative: 0 %
Eosinophils Absolute: 0.1 10*3/uL (ref 0.0–0.5)
Eosinophils Relative: 1 %
HCT: 46.7 % (ref 39.0–52.0)
Hemoglobin: 16.1 g/dL (ref 13.0–17.0)
Immature Granulocytes: 0 %
Lymphocytes Relative: 15 %
Lymphs Abs: 1.4 10*3/uL (ref 0.7–4.0)
MCH: 31.4 pg (ref 26.0–34.0)
MCHC: 34.5 g/dL (ref 30.0–36.0)
MCV: 91 fL (ref 80.0–100.0)
Monocytes Absolute: 1 10*3/uL (ref 0.1–1.0)
Monocytes Relative: 11 %
Neutro Abs: 6.7 10*3/uL (ref 1.7–7.7)
Neutrophils Relative %: 73 %
Platelet Count: 208 10*3/uL (ref 150–400)
RBC: 5.13 MIL/uL (ref 4.22–5.81)
RDW: 13.6 % (ref 11.5–15.5)
WBC Count: 9.3 10*3/uL (ref 4.0–10.5)
nRBC: 0 % (ref 0.0–0.2)

## 2022-12-11 LAB — BASIC METABOLIC PANEL - CANCER CENTER ONLY
Anion gap: 7 (ref 5–15)
BUN: 19 mg/dL (ref 8–23)
CO2: 30 mmol/L (ref 22–32)
Calcium: 10 mg/dL (ref 8.9–10.3)
Chloride: 101 mmol/L (ref 98–111)
Creatinine: 0.86 mg/dL (ref 0.61–1.24)
GFR, Estimated: >60 mL/min (ref >60)
Glucose, Bld: 97 mg/dL (ref 70–99)
Potassium: 4.2 mmol/L (ref 3.5–5.1)
Sodium: 138 mmol/L (ref 135–145)

## 2022-12-11 LAB — T4, FREE: Free T4: 0.58 ng/dL — ABNORMAL LOW (ref 0.61–1.12)

## 2022-12-11 LAB — MAGNESIUM: Magnesium: 2.2 mg/dL (ref 1.7–2.4)

## 2022-12-11 MED ORDER — MAGNESIUM SULFATE 2 GM/50ML IV SOLN
2.0000 g | Freq: Once | INTRAVENOUS | Status: AC
  Administered 2022-12-11: 2 g via INTRAVENOUS
  Filled 2022-12-11: qty 50

## 2022-12-11 MED ORDER — POTASSIUM CHLORIDE IN NACL 20-0.9 MEQ/L-% IV SOLN
Freq: Once | INTRAVENOUS | Status: AC
  Filled 2022-12-11: qty 1000

## 2022-12-11 MED ORDER — SODIUM CHLORIDE 0.9 % IV SOLN
150.0000 mg | Freq: Once | INTRAVENOUS | Status: AC
  Administered 2022-12-11: 150 mg via INTRAVENOUS
  Filled 2022-12-11: qty 150

## 2022-12-11 MED ORDER — SODIUM CHLORIDE 0.9 % IV SOLN: Freq: Once | INTRAVENOUS | Status: AC

## 2022-12-11 MED ORDER — SODIUM CHLORIDE 0.9 % IV SOLN
40.0000 mg/m2 | Freq: Once | INTRAVENOUS | Status: AC
  Administered 2022-12-11: 74 mg via INTRAVENOUS
  Filled 2022-12-11: qty 74

## 2022-12-11 MED ORDER — SODIUM CHLORIDE 0.9 % IV SOLN
10.0000 mg | Freq: Once | INTRAVENOUS | Status: AC
  Administered 2022-12-11: 10 mg via INTRAVENOUS
  Filled 2022-12-11: qty 10

## 2022-12-11 MED ORDER — PALONOSETRON HCL INJECTION 0.25 MG/5ML
0.2500 mg | Freq: Once | INTRAVENOUS | Status: AC
  Administered 2022-12-11: 0.25 mg via INTRAVENOUS
  Filled 2022-12-11: qty 5

## 2022-12-11 NOTE — Patient Instructions (Signed)
[facility] ONCOLOGY  Discharge Instructions: Thank you for choosing [facility] to provide your oncology and hematology care.   If you have a lab appointment with the [facility], please go directly to the [facility] and check in at the registration area.   Wear comfortable clothing and clothing appropriate for easy access to any Portacath or PICC line.   We strive to give you quality time with your provider. You may need to reschedule your appointment if you arrive late (15 or more minutes).  Arriving late affects you and other patients whose appointments are after yours.  Also, if you miss three or more appointments without notifying the office, you may be dismissed from the clinic at the provider's discretion.      For prescription refill requests, have your pharmacy contact our office and allow 72 hours for refills to be completed.    Today you received the following chemotherapy and/or immunotherapy agents: Cisplatin      To help prevent nausea and vomiting after your treatment, we encourage you to take your nausea medication as directed.  BELOW ARE SYMPTOMS THAT SHOULD BE REPORTED IMMEDIATELY: *FEVER GREATER THAN 100.4 F (38 C) OR HIGHER *CHILLS OR SWEATING *NAUSEA AND VOMITING THAT IS NOT CONTROLLED WITH YOUR NAUSEA MEDICATION *UNUSUAL SHORTNESS OF BREATH *UNUSUAL BRUISING OR BLEEDING *URINARY PROBLEMS (pain or burning when urinating, or frequent urination) *BOWEL PROBLEMS (unusual diarrhea, constipation, pain near the anus) TENDERNESS IN MOUTH AND THROAT WITH OR WITHOUT PRESENCE OF ULCERS (sore throat, sores in mouth, or a toothache) UNUSUAL RASH, SWELLING OR PAIN  UNUSUAL VAGINAL DISCHARGE OR ITCHING   Items with * indicate a potential emergency and should be followed up as soon as possible or go to the Emergency Department if any problems should occur.  Please show the CHEMOTHERAPY ALERT CARD or IMMUNOTHERAPY ALERT CARD at check-in to  the Emergency Department and triage nurse.  Should you have questions after your visit or need to cancel or reschedule your appointment, please contact [facility]  Dept: [phone]  and follow the prompts.  Office hours are 8:00 a.m. to 4:30 p.m. Monday - Friday. Please note that voicemails left after 4:00 p.m. may not be returned until the following business day.  We are closed weekends and major holidays. You have access to a nurse at all times for urgent questions. Please call the main number to the clinic Dept: [phone] and follow the prompts.   For any non-urgent questions, you may also contact your provider using MyChart. We now offer e-Visits for anyone 96 and older to request care online for non-urgent symptoms. For details visit [email or web address].   Also download the MyChart app! Go to the app store, search "MyChart", open the app, select [facility], and log in with your MyChart username and password.  Cisplatin Injection What is this medication? CISPLATIN (SIS pla tin) treats some types of cancer. It works by slowing down the growth of cancer cells. This medicine may be used for other purposes; ask your health care provider or pharmacist if you have questions. COMMON BRAND NAME(S): Platinol, Platinol -AQ What should I tell my care team before I take this medication? They need to know if you have any of these conditions: Eye disease, vision problems Hearing problems Kidney disease Low blood counts, such as low white cells, platelets, or red blood cells Tingling of the fingers or toes, or other nerve disorder An unusual or allergic reaction to  cisplatin, carboplatin, oxaliplatin, other medications, foods, dyes, or preservatives If you or your partner are pregnant or trying to get pregnant Breast-feeding How should I use this medication? This medication is injected into a vein. It is given by your care team in a hospital or clinic  setting. Talk to your care team about the use of this medication in children. Special care may be needed. Overdosage: If you think you have taken too much of this medicine contact a poison control center or emergency room at once. NOTE: This medicine is only for you. Do not share this medicine with others. What if I miss a dose? Keep appointments for follow-up doses. It is important not to miss your dose. Call your care team if you are unable to keep an appointment. What may interact with this medication? Do not take this medication with any of the following: Live virus vaccines This medication may also interact with the following: Certain antibiotics, such as amikacin, gentamicin, neomycin, polymyxin B, streptomycin, tobramycin, vancomycin Foscarnet This list may not describe all possible interactions. Give your health care provider a list of all the medicines, herbs, non-prescription drugs, or dietary supplements you use. Also tell them if you smoke, drink alcohol, or use illegal drugs. Some items may interact with your medicine. What should I watch for while using this medication? Your condition will be monitored carefully while you are receiving this medication. You may need blood work done while taking this medication. This medication may make you feel generally unwell. This is not uncommon, as chemotherapy can affect healthy cells as well as cancer cells. Report any side effects. Continue your course of treatment even though you feel ill unless your care team tells you to stop. This medication may increase your risk of getting an infection. Call your care team for advice if you get a fever, chills, sore throat, or other symptoms of a cold or flu. Do not treat yourself. Try to avoid being around people who are sick. Avoid taking medications that contain aspirin, acetaminophen, ibuprofen, naproxen, or ketoprofen unless instructed by your care team. These medications may hide a fever. This  medication may increase your risk to bruise or bleed. Call your care team if you notice any unusual bleeding. Be careful brushing or flossing your teeth or using a toothpick because you may get an infection or bleed more easily. If you have any dental work done, tell your dentist you are receiving this medication. Drink fluids as directed while you are taking this medication. This will help protect your kidneys. Call your care team if you get diarrhea. Do not treat yourself. Talk to your care team if you or your partner wish to become pregnant or think you might be pregnant. This medication can cause serious birth defects if taken during pregnancy and for 14 months after the last dose. A negative pregnancy test is required before starting this medication. A reliable form of contraception is recommended while taking this medication and for 14 months after the last dose. Talk to your care team about effective forms of contraception. Do not father a child while taking this medication and for 11 months after the last dose. Use a condom during sex during this time period. Do not breast-feed while taking this medication. This medication may cause infertility. Talk to your care team if you are concerned about your fertility. What side effects may I notice from receiving this medication? Side effects that you should report to your care team as soon as  possible: Allergic reactions--skin rash, itching, hives, swelling of the face, lips, tongue, or throat Eye pain, change in vision, vision loss Hearing loss, ringing in ears Infection--fever, chills, cough, sore throat, wounds that don't heal, pain or trouble when passing urine, general feeling of discomfort or being unwell Kidney injury--decrease in the amount of urine, swelling of the ankles, hands, or feet Low red blood cell level--unusual weakness or fatigue, dizziness, headache, trouble breathing Painful swelling, warmth, or redness of the skin, blisters or  sores at the infusion site Pain, tingling, or numbness in the hands or feet Unusual bruising or bleeding Side effects that usually do not require medical attention (report to your care team if they continue or are bothersome): Hair loss Nausea Vomiting This list may not describe all possible side effects. Call your doctor for medical advice about side effects. You may report side effects to FDA at 1-800-FDA-1088. Where should I keep my medication? This medication is given in a hospital or clinic. It will not be stored at home. NOTE: This sheet is a summary. It may not cover all possible information. If you have questions about this medicine, talk to your doctor, pharmacist, or health care provider.  2023 Elsevier/Gold Standard (2022-03-22 00:00:00)

## 2022-12-11 NOTE — Therapy (Signed)
OUTPATIENT SPEECH LANGUAGE PATHOLOGY ONCOLOGY EVALUATION   Patient Name: Alec Gibson MRN: [id number] DOB:21-Oct-1956, 67 y.o., male Today's Date: 12/12/2022  PCP: None in Chart REFERRING PROVIDER: [name], MD  END OF SESSION:  End of Session - 12/12/22 0949     Visit Number 1    Number of Visits 4    Date for SLP Re-Evaluation 03/12/23    SLP Start Time 0902    SLP Stop Time  0940    SLP Time Calculation (min) 38 min    Activity Tolerance Patient tolerated treatment well             Past Medical History:  Diagnosis Date   Anxiety    Depression    Thyroid disease    History reviewed. No pertinent surgical history. 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 He also reported that the ear pain radiated to beneath his jaw. He was urgently referred to ENT. 10/23/22 (same day as PCP visit)  He saw Dr. Sabino Gasser who performed a flexible laryngoscopy which revealed at firm tumor based on the right palatine tonsil with extension through the soft palate into the right eustachian tube. 10/30/22 CT neck was completed revealing  a solid mass lesion centered in the right palatine tonsil measuring 3.5 x 2.3 cm, with effacement of the adjacent parapharyngeal fat, right glossotonsillar sulcus, and likely posteromedial extent along the posterior oropharyngeal wall. CT also showed a morphologically suspicious right level IB lymph node measuring 1.4 cm, and additional suspected level II pathologic lymphadenopathy with short-segment occlusion of the right internal jugular vein in the mid neck. 11/07/22 Biopsy of the right oropharyngeal mass showed high-grade invasive SCC, p 16 +.11/21/22 PET shows a large right oropharyngeal mass and bilateral multistation hypermetabolic lymphadenopathy, no distant metastatic disease. He will receive chemotherapy/radiation. Treatment plan:  He will receive 35 fractions of radiation to his oropharynx and bilateral neck with weekly cisplatin. He started on 12/10/22 and will complete 01/27/23. Pretreatment procedures -PEG/PAC placed 12/13/22  PAIN:  Are you having pain? Yes: NPRS scale: 2/10 Pain location: rt  neck Pain description: soreness Aggravating factors: touching/pressing  FALLS: Has patient fallen in last 6 months?  No  LIVING ENVIRONMENT: Lives with: lives alone Lives in: House/apartment  PLOF:  Level of assistance: Independent with ADLs, Independent with IADLs Employment: Full-time employment  PATIENT GOALS: Maintain WNL swallowing  OBJECTIVE:   DIAGNOSTIC FINDINGS: See "pertinent history" above  COGNITION: Overall cognitive status: Within functional limits for tasks assessed  LANGUAGE: Receptive and Expressive language appeared WNL.  ORAL MOTOR EXAMINATION: Overall status: Impaired: Lingual: Right (ROM, Strength, and Coordination) Comments: Pt  demonstrated reduced ROM upper and lower labial sulci; pt states that last week he was unable to perform this lower and upper right labial sulci sweep  MOTOR SPEECH: Overall motor speech: impaired Level of impairment: Phrase Respiration: diaphragmatic/abdominal breathing Phonation: normal Resonance: WFL Articulation: Impaired: phrase Intelligibility: Intelligible  CLINICAL SWALLOW ASSESSMENT:   Current diet: regular and thin liquids Objective swallow impairments: "Easier to swallow the last day or two since I started treatments: But pt stated it was more difficult to manipulate bolus due to tumor size. Objective recommended compensations: smaller bites and sips Dentition: adequate natural dentition Patient directly observed with POs: Yes: dysphagia 3 (soft) and thin liquids  Feeding: able to feed self Liquids provided by: cup Oral phase signs and symptoms: prolonged bolus formation Pharyngeal phase signs and symptoms:  none   TODAY'S TREATMENT:                                                                                                                                         DATE:  12/12/22 (eval): Research states the risk for dysphagia increases due to radiation and/or chemotherapy treatment due to a variety of factors, so SLP educated the pt about the possibility of reduced/limited ability for PO intake during rad tx. SLP also educated pt regarding possible changes to swallowing musculature after rad tx, and why adherence to dysphagia HEP provided today and PO consumption was necessary to inhibit muscle fibrosis following rad tx and to mitigate muscle disuse atrophy. SLP informed pt why this would be detrimental to their swallowing status and to their pulmonary health. Pt demonstrated understanding of these things to SLP. SLP encouraged pt to safely eat and drink as deep into their radiation/chemotherapy as possible to provide the best possible long-term swallowing outcome for pt.  SLP  then developed an individualized HEP for pt involving oral and pharyngeal strengthening and ROM and pt was instructed how to perform these exercises, including SLP demonstration. After SLP demonstration, pt return demonstrated each exercise. SLP ensured pt performance was correct prior to educating pt on next exercise. Pt required occasional min-mod cues faded to modified independent to perform HEP. Pt was instructed to complete this program at least 6 days/week, at least 2 times a day until 6 months after his or her last day of rad tx and then x2 a week after that, indefinitely. Among other modifications for days when pt cannot functionally swallow, SLP also suggested pt  cycle through the swallowing portion so the full program of exercises can be completed instead of fatiguing on one of the swallowing exercises and being unable to perform the other swallowing exercises. SLP instructed that swallowing exercise reps should then be increased back into the  regimen as pt is able to do so. Secondly, pt was told that former patients have told SLP that during their course of radiation therapy, taking prescribed pain medication just prior to performing HEP (and eating/drinking) has proven helpful in completing HEP (and eating and drinking) more regularly when going through their course of radiation treatment.    PATIENT EDUCATION: Education details: late effects head/neck radiation on swallow function, HEP procedure, and modification to HEP when difficulty experienced with swallowing during and after radiation course Person educated: Patient Education method: Explanation, Demonstration, Verbal cues, and Handouts Education comprehension: verbalized understanding, returned demonstration, verbal cues required, and needs further education   ASSESSMENT:  CLINICAL IMPRESSION: Patient is a 67 y.o. male who was seen today for assessment of swallowing as they undergo radiation/chemoradiation therapy. Today pt ate  [place] sandwich and drank thin liquids without overt s/s oral or pharyngeal difficulty. At this time pt swallowing is deemed WNL/WFL with these POs. No oral or overt s/sx pharyngeal deficits, including aspiration were observed. There are no overt s/s aspiration PNA observed by SLP nor any reported by pt at this time. Data indicate that pt's swallow ability will likely decrease over the course of radiation/chemoradiation therapy and could very well decline over time following the conclusion of that therapy due to muscle disuse atrophy and/or muscle fibrosis. Pt will cont to need to be seen by SLP in order to assess safety of PO intake, assess the need for recommending any objective swallow assessment, and ensuring pt is correctly completing the individualized HEP.  OBJECTIVE IMPAIRMENTS: include dysphagia. These impairments are limiting patient from safety when swallowing. Factors affecting potential to achieve goals and functional outcome are  none noted today . Patient will benefit from skilled SLP services to address above impairments and improve overall function.  REHAB POTENTIAL: Good   GOALS: Goals reviewed with patient? Yes  SHORT TERM GOALS: Target: 3rd total session   Pt will compelte HEP with modified independence in 2 sessions Baseline: Goal status: INITIAL   2.  pt will tell SLP why pt is completing HEP with modified independence Baseline:  Goal status: INITIAL   3.  pt will describe 3 overt s/s aspiration PNA with modified independence Baseline:  Goal status: INITIAL   4.  pt will tell SLP how a food journal could hasten return to a more normalized diet Baseline:  Goal status: INITIAL     LONG TERM GOALS: Target: 7th total session   1.  pt will complete HEP with independence over two visits Baseline:  Goal status: INITIAL   2.  pt will describe how to modify HEP over time, and the timeline associated with reduction in HEP frequency with modified independence over two  sessions Baseline:  Goal status: INITIAL  PLAN:  SLP FREQUENCY:  once approx every 4 weeks  SLP DURATION:  6 sessions  PLANNED INTERVENTIONS: Aspiration precaution training, Pharyngeal strengthening exercises, Diet toleration management , Environmental controls, Trials of upgraded texture/liquids, Cueing hierachy, Internal/external aids, SLP instruction and feedback, Compensatory strategies, and Patient/family education    [facility], [place] 12/12/2022, 9:50 AM

## 2022-12-11 NOTE — Progress Notes (Signed)
Oncology Nurse Navigator Documentation   Met with Bensyn Bornemann to provide PEG/port education prior to 12/13/22 placement. Provided port educational handout, showed example, provided guidance for post-surgical dsg removal, site care.  Using  PEG teaching device   and Teach Back, provided education for PEG use and care, including: hand hygiene, gravity bolus administration of daily water flushes and nutritional supplement, fluids and medications; care of tube insertion site including daily dressing change and cleaning; S&S of infection.   Mr. Nygard correctly verbalized procedures for and provided correct return demonstration of gravity administration of water, dressing change and site care.  I provided written instructions for PEG flushing/dressing change in support of verbal instruction.   I provided/described contents of Start of Care Bolus Feeding Kit (3 60 cc syringes, 1 box 4x4 drainage sponges, 1 package mesh briefs, 1 roll paper tape, 1 case Osmolite 1.5).  He voiced understanding he is to start using Osmolite per guidance of Nutrition. He understands I will be available for ongoing PEG support. Provided barium sulfate prep which I obtained from [facility] IR and reviewed instructions.    Harlow Asa RN, BSN, OCN Head & Neck Oncology Nurse [place] at [facility] Phone # [phone]  Fax # [phone]

## 2022-12-11 NOTE — Progress Notes (Signed)
Nutrition Assessment   Reason for Assessment: HNC   ASSESSMENT: 67 year old male with newly diagnosed SCC of oropharynx. He is receiving concurrent chemoradiation under the care of Dr. Chryl Heck and Dr. Isidore Moos (start 1/3).  Past medical history includes anxiety, depression, thyroid disease  PEG/PAC planned 1/5  Met with patient during infusion. He reports good appetite and has been pushing to eat more recently. Patient typically has 2 meals and snacks. Recalls 2 pieces of toast and cup of coffee for breakfast, 1/2 rack ribs with cottage cheese for dinner, consumed ~one quart of 2% milk as well as 2-3 Ensure Plus yesterday. Patient is drinking ~60 ounces of water. He denies altered taste, sore throat, dysphagia. Patient reports dry mouth at baseline. He is doing baking soda salt water rinses.    Medications: D3, klonopin, decadron, synthroid, zofran, compazine, xyzal   Labs: reviewed    Anthropometrics: Limited wt history, per pt  wts have decreased ~10 lbs (6%) from usual weight in the last 6 weeks  Height: 5'9" Weight: 152 lb 4 oz  UBW: 160-165 lb (per pt in Nov 2023) BMI: 22.48   Estimated Energy Needs  Kcals: 2180-2545 Protein: 95-116 Fluid: >2.1 L   NUTRITION DIAGNOSIS: Unintentional weight loss related to [facility] as evidenced by 6% decrease from reported usual weight in 6 weeks which is significant   INTERVENTION:  Educated on importance of adequate calories and protein to maintain weights/strength during treatment Discussed ways to add calories and protein to foods (switching to whole milk, adding cheese/gravy/butter) Encouraged high protein snacks in between meals and at bedtime - handout with ideas provided Continue drinking 2-3 Ensure Plus/equivalent daily One complimentary case of Ensure Plus provided today PEG scheduled 1/5 - will plan to complete bolus feeding at next visit Contact information given    MONITORING, EVALUATION, GOAL: Patient will tolerate increased  calories and protein to minimize weight loss during treatment    Next Visit: Wednesday January 10 during infusion

## 2022-12-12 ENCOUNTER — Inpatient Hospital Stay: Payer: Medicare Other | Admitting: Licensed Clinical Social Worker

## 2022-12-12 ENCOUNTER — Other Ambulatory Visit: Payer: Self-pay

## 2022-12-12 ENCOUNTER — Ambulatory Visit: Payer: Medicare Other | Attending: Radiation Oncology

## 2022-12-12 ENCOUNTER — Ambulatory Visit
Admission: RE | Admit: 2022-12-12 | Discharge: 2022-12-12 | Disposition: A | Discharge status: Home / Self Care | Payer: Medicare Other | Source: Ambulatory Visit | Attending: Radiation Oncology | Admitting: Radiation Oncology

## 2022-12-12 ENCOUNTER — Other Ambulatory Visit (HOSPITAL_COMMUNITY): Payer: Self-pay | Admitting: Student

## 2022-12-12 ENCOUNTER — Telehealth: Payer: Self-pay

## 2022-12-12 DIAGNOSIS — C109 Malignant neoplasm of oropharynx, unspecified: Secondary | ICD-10-CM | POA: Diagnosis not present

## 2022-12-12 DIAGNOSIS — R1310 Dysphagia, unspecified: Secondary | ICD-10-CM | POA: Diagnosis not present

## 2022-12-12 DIAGNOSIS — C090 Malignant neoplasm of tonsillar fossa: Secondary | ICD-10-CM | POA: Diagnosis not present

## 2022-12-12 DIAGNOSIS — Z510 Encounter for antineoplastic radiation therapy: Secondary | ICD-10-CM | POA: Diagnosis not present

## 2022-12-12 LAB — RAD ONC ARIA SESSION SUMMARY
Course Elapsed Days: 2
Plan Fractions Treated to Date: 3
Plan Prescribed Dose Per Fraction: 2 Gy
Plan Total Fractions Prescribed: 35
Plan Total Prescribed Dose: 70 Gy
Reference Point Dosage Given to Date: 6 Gy
Reference Point Session Dosage Given: 2 Gy
Session Number: 3

## 2022-12-12 NOTE — Progress Notes (Signed)
[place] Work  Initial Assessment   Alec Gibson is a 67 y.o. year old male presenting alone. Clinical Social Work was referred by nurse navigator for assessment of psychosocial needs.   SDOH (Social Determinants of Health) assessments performed: Yes SDOH Interventions    Flowsheet Row Clinical Support from 12/12/2022 in [facility]  SDOH Interventions   Food Insecurity Interventions Intervention Not Indicated  Housing Interventions Intervention Not Indicated  Transportation Interventions Intervention Not Indicated  Financial Strain Interventions Other (Comment)  [cancer foundations]       SDOH Screenings   Food Insecurity: No Food Insecurity (12/12/2022)  Housing: Low Risk  (12/12/2022)  Transportation Needs: No Transportation Needs (12/12/2022)  Financial Resource Strain: Medium Risk (12/12/2022)  Tobacco Use: Low Risk  (12/12/2022)     Distress Screen completed: No     No data to display            Family/Social Information:  Housing Arrangement: patient lives alone Family members/support persons in your life? Family, Friends, and [employer] concerns: no  Employment: Working full time but will need to take FMLA at some point (no short-term disability).  Income source: Employment. Some minimal help from family currently Financial concerns: Yes, due to illness and/or loss of work during treatment Type of concern: Utilities and Rent/ mortgage Food access concerns: no- neighbors and church members have been bringing meals Services Currently in place:  FMLA available when needed  Coping/ Adjustment to diagnosis: Patient understands treatment plan and what happens next? yes, has already had three radiation treatments and first chemo. Feels like he is already having good impact from treatment Concerns about diagnosis and/or treatment:  Losing income Patient reported stressors: Finances Current coping skills/ strengths: Active  sense of humor , Capable of independent living , Communication skills , Motivation for treatment/growth , and Supportive family/friends     SUMMARY: Current SDOH Barriers:  Financial constraints related to potential loss of income during treatment  Clinical Social Work Clinical Goal(s):  Patient will work with SW to address concerns related to financial constraints by applying for assistance from cancer foundations  Interventions: Discussed common feeling and emotions when being diagnosed with cancer, and the importance of support during treatment Informed patient of the support team roles and support services at [facility] Provided [place] contact information and encouraged patient to call with any questions or concerns Provided patient with information about cancer foundation assistance ([facility], [facility] ) Will sign pt up for [employer] when he is no longer working   Follow Up Plan: Patient will contact CSW with any support or resource needs Patient verbalizes understanding of plan: Yes    Alec Gibson E Noam Karaffa, LCSW

## 2022-12-12 NOTE — Progress Notes (Signed)
Oncology Nurse Navigator Documentation   I met with Alec Gibson before and during his SLP appointment today with Garald Balding SLP. He was also able to talk with Sharyn Lull SW. He is tolerating treatment well and tells me that he already feels like the treatment is helping decrease his tumor. He knows to call me if he has any needs or questions in the future.  Harlow Asa RN, BSN, OCN Head & Neck Oncology Nurse [place] at [facility] Phone # [phone]  Fax # [phone]

## 2022-12-12 NOTE — Telephone Encounter (Signed)
-----   Message from Alvera Singh, RN sent at 12/11/2022  2:51 PM EST ----- 12/11/22- first time Cisplatin. Dr. Chryl Heck. Tolerated well.

## 2022-12-12 NOTE — Telephone Encounter (Signed)
LM for patient that this nurse was calling to see how they were doing after their treatment. Please call back to Dr. Iruku's nurse at [phone] if they have any questions or concerns regarding the treatment. 

## 2022-12-13 ENCOUNTER — Other Ambulatory Visit: Payer: Self-pay

## 2022-12-13 ENCOUNTER — Emergency Department (HOSPITAL_BASED_OUTPATIENT_CLINIC_OR_DEPARTMENT_OTHER): Payer: Medicare Other

## 2022-12-13 ENCOUNTER — Encounter (HOSPITAL_COMMUNITY): Payer: Self-pay

## 2022-12-13 ENCOUNTER — Telehealth: Payer: Self-pay

## 2022-12-13 ENCOUNTER — Ambulatory Visit: Payer: Medicare Other

## 2022-12-13 ENCOUNTER — Observation Stay (HOSPITAL_COMMUNITY)
Admission: EM | Admit: 2022-12-13 | Discharge: 2022-12-14 | Disposition: A | Discharge status: Home / Self Care | Payer: Medicare Other | Attending: Internal Medicine | Admitting: Internal Medicine

## 2022-12-13 ENCOUNTER — Ambulatory Visit (HOSPITAL_COMMUNITY)
Admission: RE | Admit: 2022-12-13 | Discharge: 2022-12-13 | Disposition: A | Discharge status: Home / Self Care | Payer: Medicare Other | Source: Ambulatory Visit | Attending: Hematology and Oncology | Admitting: Hematology and Oncology

## 2022-12-13 ENCOUNTER — Encounter: Payer: Self-pay | Admitting: Hematology and Oncology

## 2022-12-13 VITALS — Ht 69.0 in | Wt 152.2 lb

## 2022-12-13 DIAGNOSIS — E039 Hypothyroidism, unspecified: Secondary | ICD-10-CM | POA: Insufficient documentation

## 2022-12-13 DIAGNOSIS — Z01812 Encounter for preprocedural laboratory examination: Secondary | ICD-10-CM

## 2022-12-13 DIAGNOSIS — R0602 Shortness of breath: Secondary | ICD-10-CM | POA: Insufficient documentation

## 2022-12-13 DIAGNOSIS — R001 Bradycardia, unspecified: Secondary | ICD-10-CM | POA: Diagnosis not present

## 2022-12-13 DIAGNOSIS — R55 Syncope and collapse: Secondary | ICD-10-CM | POA: Diagnosis not present

## 2022-12-13 DIAGNOSIS — C109 Malignant neoplasm of oropharynx, unspecified: Secondary | ICD-10-CM

## 2022-12-13 DIAGNOSIS — Z79899 Other long term (current) drug therapy: Secondary | ICD-10-CM | POA: Diagnosis not present

## 2022-12-13 DIAGNOSIS — F17210 Nicotine dependence, cigarettes, uncomplicated: Secondary | ICD-10-CM | POA: Insufficient documentation

## 2022-12-13 DIAGNOSIS — Z539 Procedure and treatment not carried out, unspecified reason: Secondary | ICD-10-CM | POA: Insufficient documentation

## 2022-12-13 DIAGNOSIS — R42 Dizziness and giddiness: Secondary | ICD-10-CM | POA: Diagnosis not present

## 2022-12-13 DIAGNOSIS — I469 Cardiac arrest, cause unspecified: Secondary | ICD-10-CM | POA: Diagnosis not present

## 2022-12-13 LAB — BASIC METABOLIC PANEL
Anion gap: 8 (ref 5–15)
BUN: 21 mg/dL (ref 8–23)
CO2: 23 mmol/L (ref 22–32)
Calcium: 8.3 mg/dL — ABNORMAL LOW (ref 8.9–10.3)
Chloride: 103 mmol/L (ref 98–111)
Creatinine, Ser: 0.83 mg/dL (ref 0.61–1.24)
GFR, Estimated: >60 mL/min (ref >60)
Glucose, Bld: 99 mg/dL (ref 70–99)
Potassium: 4.3 mmol/L (ref 3.5–5.1)
Sodium: 134 mmol/L — ABNORMAL LOW (ref 135–145)

## 2022-12-13 LAB — CBC
HCT: 43.2 % (ref 39.0–52.0)
HCT: 39.4 % (ref 39.0–52.0)
Hemoglobin: 14.5 g/dL (ref 13.0–17.0)
Hemoglobin: 13.3 g/dL (ref 13.0–17.0)
MCH: 31.3 pg (ref 26.0–34.0)
MCH: 31.4 pg (ref 26.0–34.0)
MCHC: 33.6 g/dL (ref 30.0–36.0)
MCHC: 33.8 g/dL (ref 30.0–36.0)
MCV: 93.1 fL (ref 80.0–100.0)
MCV: 92.9 fL (ref 80.0–100.0)
Platelets: 212 10*3/uL (ref 150–400)
Platelets: 199 10*3/uL (ref 150–400)
RBC: 4.64 MIL/uL (ref 4.22–5.81)
RBC: 4.24 MIL/uL (ref 4.22–5.81)
RDW: 14 % (ref 11.5–15.5)
RDW: 13.9 % (ref 11.5–15.5)
WBC: 14.7 10*3/uL — ABNORMAL HIGH (ref 4.0–10.5)
WBC: 12.1 10*3/uL — ABNORMAL HIGH (ref 4.0–10.5)
nRBC: 0 % (ref 0.0–0.2)
nRBC: 0 % (ref 0.0–0.2)

## 2022-12-13 LAB — ECHOCARDIOGRAM COMPLETE
AR max vel: 2.98 cm2
AV Area VTI: 3.09 cm2
AV Area mean vel: 2.87 cm2
AV Mean grad: 2 mmHg
AV Peak grad: 3.8 mmHg
Ao pk vel: 0.98 m/s
Area-P 1/2: 3.6 cm2
Height: 69 in
S' Lateral: 3 cm
Weight: 2435.99 oz

## 2022-12-13 LAB — CBC WITH DIFFERENTIAL/PLATELET
Abs Immature Granulocytes: 0.08 10*3/uL — ABNORMAL HIGH (ref 0.00–0.07)
Basophils Absolute: 0 10*3/uL (ref 0.0–0.1)
Basophils Relative: 0 %
Eosinophils Absolute: 0 10*3/uL (ref 0.0–0.5)
Eosinophils Relative: 0 %
HCT: 37.1 % — ABNORMAL LOW (ref 39.0–52.0)
Hemoglobin: 12.3 g/dL — ABNORMAL LOW (ref 13.0–17.0)
Immature Granulocytes: 1 %
Lymphocytes Relative: 9 %
Lymphs Abs: 1.3 10*3/uL (ref 0.7–4.0)
MCH: 31.5 pg (ref 26.0–34.0)
MCHC: 33.2 g/dL (ref 30.0–36.0)
MCV: 95.1 fL (ref 80.0–100.0)
Monocytes Absolute: 1.3 10*3/uL — ABNORMAL HIGH (ref 0.1–1.0)
Monocytes Relative: 9 %
Neutro Abs: 11.9 10*3/uL — ABNORMAL HIGH (ref 1.7–7.7)
Neutrophils Relative %: 81 %
Platelets: 168 10*3/uL (ref 150–400)
RBC: 3.9 MIL/uL — ABNORMAL LOW (ref 4.22–5.81)
RDW: 14.1 % (ref 11.5–15.5)
WBC: 14.6 10*3/uL — ABNORMAL HIGH (ref 4.0–10.5)
nRBC: 0 % (ref 0.0–0.2)

## 2022-12-13 LAB — PROTIME-INR
INR: 1 (ref 0.8–1.2)
Prothrombin Time: 13.2 seconds (ref 11.4–15.2)

## 2022-12-13 LAB — TROPONIN I (HIGH SENSITIVITY)
Troponin I (High Sensitivity): <2 ng/L (ref <18)
Troponin I (High Sensitivity): 3 ng/L (ref <18)

## 2022-12-13 LAB — TSH: TSH: 33.847 u[IU]/mL — ABNORMAL HIGH (ref 0.350–4.500)

## 2022-12-13 LAB — BRAIN NATRIURETIC PEPTIDE: B Natriuretic Peptide: 30.8 pg/mL (ref 0.0–100.0)

## 2022-12-13 LAB — MAGNESIUM: Magnesium: 2.2 mg/dL (ref 1.7–2.4)

## 2022-12-13 LAB — LACTIC ACID, PLASMA: Lactic Acid, Venous: 1.3 mmol/L (ref 0.5–1.9)

## 2022-12-13 MED ORDER — LACTATED RINGERS IV BOLUS
1000.0000 mL | Freq: Once | INTRAVENOUS | Status: AC
  Administered 2022-12-13: 1000 mL via INTRAVENOUS

## 2022-12-13 MED ORDER — SODIUM CHLORIDE 0.9 % IV SOLN: INTRAVENOUS | Status: DC

## 2022-12-13 MED ORDER — ATROPINE SULFATE 1 MG/10ML IJ SOSY
PREFILLED_SYRINGE | INTRAMUSCULAR | Status: AC
  Administered 2022-12-13: 1 mg
  Filled 2022-12-13: qty 10

## 2022-12-13 MED ORDER — CEFAZOLIN SODIUM-DEXTROSE 2-4 GM/100ML-% IV SOLN: 2.0000 g | INTRAVENOUS | Status: DC

## 2022-12-13 MED ORDER — ONDANSETRON HCL 4 MG/2ML IJ SOLN
4.0000 mg | Freq: Four times a day (QID) | INTRAMUSCULAR | Status: DC | PRN
  Administered 2022-12-13: 4 mg via INTRAVENOUS
  Filled 2022-12-13: qty 2

## 2022-12-13 MED ORDER — LEVOTHYROXINE SODIUM 75 MCG PO TABS
150.0000 ug | ORAL_TABLET | Freq: Every day | ORAL | Status: DC
  Administered 2022-12-14: 150 ug via ORAL
  Filled 2022-12-13: qty 2

## 2022-12-13 MED ORDER — ENOXAPARIN SODIUM 40 MG/0.4ML IJ SOSY
40.0000 mg | PREFILLED_SYRINGE | INTRAMUSCULAR | Status: DC
  Administered 2022-12-13: 40 mg via SUBCUTANEOUS
  Filled 2022-12-13: qty 0.4

## 2022-12-13 MED ORDER — SODIUM CHLORIDE 0.9 % IV BOLUS
1000.0000 mL | Freq: Once | INTRAVENOUS | Status: AC
  Administered 2022-12-13: 1000 mL via INTRAVENOUS

## 2022-12-13 MED ORDER — SODIUM CHLORIDE 0.9 % IV BOLUS
250.0000 mL | Freq: Once | INTRAVENOUS | Status: AC
  Administered 2022-12-13: 250 mL via INTRAVENOUS

## 2022-12-13 MED ORDER — ACETAMINOPHEN 325 MG PO TABS
650.0000 mg | ORAL_TABLET | Freq: Four times a day (QID) | ORAL | Status: DC | PRN
  Administered 2022-12-13: 650 mg via ORAL
  Filled 2022-12-13: qty 2

## 2022-12-13 MED ORDER — CLONAZEPAM 0.5 MG PO TABS: 1.0000 mg | ORAL_TABLET | Freq: Two times a day (BID) | ORAL | Status: DC | PRN

## 2022-12-13 NOTE — Hospital Course (Addendum)
Was at [facility] long short stay for proceudre (what procedure???) - port a cath and gastrostomy tube prior to starting chemoradiation Pmh oropharyngeal cancer on chemo//squamous cell carcinoma of oropharynx (has he had any doses yet? When was he supposed to start?), thyroid disease   Presents after syncopal episode while staff placing 14 g IC in hand in prepa for port placement Patient became lightheaded, sweaty, jnausea after IV stick  Got 200cc with good response Had second episode of brady/hypotension while sitting upright unprovoked, bp responded to IVF  - hr 30s, hypotensive 57W systolic, became more diaphoretic, hr dropped showeed 0 on monitor, zoll pads were placed and pt got '1mg'$  atropine, responsive again with hr 20-30s and he was brought to ED  Vasovagal syncope [facility] Getting procedure Atropine anmd recovered No cards admission Im consult Low brady  Who is oncologist What is the treatment? Is this something we need to work on here?  Check ECHO  Anx/depression Thyroid disease   Alec Fendt MD - PCP and Oncologist  Patient was on bowel prep for procedure today and NPO overnight. Patient reports one accidental bowel movement yesterday. Started chemoradiation 2 days ago (3x radiation, 1x chemo) for 7 week regimen total. Next scheduled chemo for Monday. Patient states he has been eating less over the last two days and had some nausea yesterday. *** He reports feeling foggy when he got here today around 7:30AM and felt like he wet himself (?). Patient says he felt dizzy, light-headed and cold sweat while they were inserting the IV today, and the same happened again about 15 min later. He says did not feel like he lost consciousness. Denies chest pain, palpitations, or recent episodes of dizziness. Never had syncope before. No fevers, chills, vomiting. No abdominal pain, diarrhea, dysuria. No cough. Denies history of seizures, arrhythmias, heart failure. Denies history of blood  clots.     Pmhx: -Hypothyroidism -SCC  Meds: -Synthroid -Levocitirizine -Zofran -Decadron?  No chest/abdominal surgeries  Work? Good ADLs/IADLs. Former smoker, 3 years, ?packs. 5 drinks/month. Denies recreational drug use.   Grandfather - died of heart disease, age 79.  No history of MI before age 67. No family hx of arrythmias, CVAs, DVT/PE, cancer.   Physical Exam:  -Lesion on R tonsil  -Two fixed, firm cervical lymph nodes on the right   Full code  Plan: -F/u cardiology recommendations -Diet -Oncology (procedure, chemo, radiation) -Oncology consult (continue radiation while in hospital, continue next Monday, unsure about port-a-cath/G tube placement, Dr. Lisbeth Renshaw) -Chemo medication -T4   1/6 Feels better, states feeling "weak" overall but not in any specific place.Tylenol and zofran helped with nausea overnight. Denies needle phobia. Endorses taking levo correctly.   Discussed etiology unlikely to be cardiogenic. Discussed continuing following up with outpatient oncology.   Orthostatic BP today Increase levo dose Patient ok with scheduling HFU appt in 1-2 weeks

## 2022-12-13 NOTE — Consult Note (Signed)
Cardiology Consultation   Patient ID: Alec Gibson MRN: [id number]; DOB: 01-12-1956  Admit date: 12/13/2022 Date of Consult: 12/13/2022  PCP:  [name], NP   [place] Providers Cardiologist:  None        Patient Profile:   Alec Gibson is a 67 y.o. 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 Nechama Guard.  History of Present Illness:   Alec Gibson 66 year old male who smoked only in his 51s, quit 40 years ago with squamous cell carcinoma undergoing chemoradiation who earlier today in the preprocedure room for Port-A-Cath and PEG placement had a vasovagal episode.  Heart rate decreased into the 20s.  Atropine was administered.  IV fluids administered.  ZOLL pads placed.  He had classic constellation of diaphoresis nausea surrounding the event.  He states that he is trying to eat as much as he can but this is markedly limited by his current radiation/chemotherapy.  He has not had any recent diarrhea.  He is admits that his p.o. intake is diminished.  He has never had a vagal type episode previously.  No prior syncope.  No prior heart history.  No chest pain fevers chills nausea vomiting diarrhea.  His grandfather died of heart disease.  No early family history of CAD.  States that he quit smoking 40 years ago.  Only smoked in his 14s.  He has been n.p.o. past midnight.  Currently feels well laying down.    Past Medical History:  Diagnosis Date   Anxiety    Depression    Thyroid disease     History reviewed. No pertinent surgical history.   Home Medications:  Prior to Admission medications   Medication Sig Start Date End Date Taking? Authorizing Provider  cholecalciferol (VITAMIN D3) 25 MCG (1000 UNIT) tablet Take 1,000 Units by mouth daily.    [provider]  clonazePAM (KLONOPIN) 1 MG tablet Take 1 mg by mouth 2 (two) times daily as needed for  anxiety (sleep). 06/28/22   [provider]  dexamethasone (DECADRON) 4 MG tablet Take 2 tablets daily x 3 days starting the day after cisplatin chemotherapy. Take with food. 11/23/22   [name], MD  HYDROcodone-acetaminophen (NORCO/VICODIN) 5-325 MG tablet Take 1 tablet by mouth every 6 (six) hours as needed for moderate pain. 12/10/22   [name], MD  levocetirizine (XYZAL) 5 MG tablet Take 5 mg by mouth every evening. 10/21/22   [provider]  levothyroxine (SYNTHROID) 150 MCG tablet Take 150 mcg by mouth daily before breakfast.    [provider]  lidocaine-prilocaine (EMLA) cream Apply to affected area once 11/23/22   Iruku, Arletha Pili, MD  ondansetron (ZOFRAN) 8 MG tablet Take 1 tablet (8 mg total) by mouth every 8 (eight) hours as needed for nausea or vomiting. Start on the third day after cisplatin. 11/23/22   [name], MD  prochlorperazine (COMPAZINE) 10 MG tablet Take 1 tablet (10 mg total) by mouth every 6 (six) hours as needed (Nausea or vomiting). 11/23/22   [name], MD  vitamin E 200 UNIT capsule Take 200 Units by mouth daily.    [provider]  zinc gluconate 50 MG tablet Take 50 mg by mouth daily.    [provider]    Inpatient Medications: Scheduled Meds:  Continuous Infusions:  PRN Meds:   Allergies:    Allergies  Allergen Reactions   Sulfa  Antibiotics     Social History:   Social History   Socioeconomic History   Marital status: Divorced    Spouse name: Not on file   Number of children: Not on file   Years of education: Not on file   Highest education level: Not on file  Occupational History   Not on file  Tobacco Use   Smoking status: Never   Smokeless tobacco: Never  Substance and Sexual Activity   Alcohol use: Yes   Drug use: Never   Sexual activity: Not on file  Other Topics Concern   Not on file  Social History Narrative   Not on file   Social Determinants of Health    Financial Resource Strain: Medium Risk (12/12/2022)   Overall Financial Resource Strain (CARDIA)    Difficulty of Paying Living Expenses: Somewhat hard  Food Insecurity: No Food Insecurity (12/12/2022)   Hunger Vital Sign    Worried About Running Out of Food in the Last Year: Never true    Ran Out of Food in the Last Year: Never true  Transportation Needs: No Transportation Needs (12/12/2022)   PRAPARE - Hydrologist (Medical): No    Lack of Transportation (Non-Medical): No  Physical Activity: Not on file  Stress: Not on file  Social Connections: Not on file  Intimate Partner Violence: Not on file    Family History:   History reviewed. No pertinent family history.   ROS:  Please see the history of present illness.   All other ROS reviewed and negative.     Physical Exam/Data:   Vitals:   12/13/22 1010 12/13/22 1052 12/13/22 1054 12/13/22 1056  BP:  114/76 122/73 123/78  Pulse: 80 95 (!) 101 (!) 103  Resp:  (!) '23 14 17  '$ Temp:      TempSrc:      SpO2:  100% 99% 100%    Intake/Output Summary (Last 24 hours) at 12/13/2022 1108 Last data filed at 12/13/2022 1104 Gross per 24 hour  Intake 1000 ml  Output --  Net 1000 ml      12/13/2022    8:15 AM 12/11/2022    8:37 AM 12/10/2022    9:48 AM  Last 3 Weights  Weight (lbs) 152 lb 4 oz 152 lb 4 oz 152 lb 9.6 oz  Weight (kg) 69.06 kg 69.06 kg 69.219 kg     There is no height or weight on file to calculate BMI.  General:  Well nourished, well developed, in no acute distress laying flat in bed. HEENT: normal Neck: no JVD Vascular: No carotid bruits; Distal pulses 2+ bilaterally Cardiac:  normal S1, S2; RRR; no murmur  Lungs:  clear to auscultation bilaterally, no wheezing, rhonchi or rales  Abd: soft, nontender, no hepatomegaly  Ext: no edema Musculoskeletal:  No deformities, BUE and BLE strength normal and equal Skin: warm and dry  Neuro:  CNs 2-12 intact, no focal abnormalities noted Psych:  Normal  affect   EKG:  The EKG was personally reviewed and demonstrates: Sinus rhythm 69 with normal intervals  Telemetry:  Telemetry was personally reviewed and demonstrates: Personally reviewed shows sinus rhythm that transition to marked sinus bradycardia.  There was no asystole present.  There was no significant prolongation of PR interval during the bradycardic episodes.  Bradycardic episodes were short-lived, transient.  Relevant CV Studies: No prior cardiac studies  Laboratory Data:  High Sensitivity Troponin:  No results for input(s): "TROPONINIHS" in the last 720  hours.   Chemistry Recent Labs  Lab 12/11/22 0745  NA 138  K 4.2  CL 101  CO2 30  GLUCOSE 97  BUN 19  CREATININE 0.86  CALCIUM 10.0  MG 2.2  GFRNONAA >60  ANIONGAP 7    No results for input(s): "PROT", "ALBUMIN", "AST", "ALT", "ALKPHOS", "BILITOT" in the last 168 hours. Lipids No results for input(s): "CHOL", "TRIG", "HDL", "LABVLDL", "LDLCALC", "CHOLHDL" in the last 168 hours.  Hematology Recent Labs  Lab 12/11/22 0745 12/13/22 0752 12/13/22 1044  WBC 9.3 14.7* 14.6*  RBC 5.13 4.64 3.90*  HGB 16.1 14.5 12.3*  HCT 46.7 43.2 37.1*  MCV 91.0 93.1 95.1  MCH 31.4 31.3 31.5  MCHC 34.5 33.6 33.2  RDW 13.6 14.0 14.1  PLT 208 212 168   Thyroid  Recent Labs  Lab 12/11/22 0745  FREET4 0.58*    BNPNo results for input(s): "BNP", "PROBNP" in the last 168 hours.  DDimer No results for input(s): "DDIMER" in the last 168 hours.   Radiology/Studies:  No results found.   Assessment and Plan:   Vasovagal syncope - Has had episode in the emergency department after IV was not able to draw blood black.  While fixing the dressing patient was dizzy nauseated heart rates dropped into the 30s he was hypotensive with blood pressure of 70 systolic.  Diaphoretic ZOLL pads placed with 1 mg of atropine administered. -He is on no antihypertensives at home. -Dehydration prior to this event likely precipitated as well as  stomach discomfort, vagal stimulation etc. -Continue with IV fluid -We will check an echocardiogram to ensure proper structure and function of his heart. -He will be transferred to [facility] ER to be closer to our cardiovascular team if any further intervention is needed.  Explained to him that these episodes are self-regulating and do not require pacemaker for the most part.  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  Prior to the procedure, had 2 episodes of vasovagal while getting ready and short stay.  Reviewed interventional radiology's notes, Dr. Jiles Harold.  He was sent to the ER for further evaluation.  We will continue to follow along. Likely will just require a short observation.     For questions or updates, please contact [place] Please consult [email or web address] for contact info under    Signed, Candee Furbish, MD  12/13/2022 11:08 AM

## 2022-12-13 NOTE — Progress Notes (Signed)
Received call about this patient who is currently in the emergency room for a syncopal episode.  He has begun a 7-week course of radiation treatment for his head neck cancer, currently having received 3 out of 35 planned treatments.  He was scheduled to have a feeding tube placed as well as a Port-A-Cath placed.  Discussed that neither of these procedures directly would be necessary to continue radiation treatment early next week.  Would defer to medical oncology regarding the urgency of having the Port-A-Cath placed.  I believe ideally, if the patient is in the hospital beyond a few days then it ideally could be done as an inpatient.  I would assume that if the patient is able to be discharged in stable condition this weekend that these procedures could be rescheduled as an outpatient next week.  Tentatively, we will plan to continue with radiation treatment under Dr. Pearlie Oyster service early next week.  ------------------------------------------------  Jodelle Gross, MD, PhD

## 2022-12-13 NOTE — ED Notes (Signed)
MD ok with pt having a half glass of water. Water provided

## 2022-12-13 NOTE — Consult Note (Addendum)
Chief Complaint: Patient was seen in consultation today for Port-A-Cath and gastrostomy tube placements  Referring Physician(s): Iruku,Praveena  Supervising Physician: Daryll Brod  Patient Status: [facility] - Out-pt  History of Present Illness: Alec Gibson is a 67 y.o. male with PMH sig for anxiety/depression ,thyroid disease who presents now with newly diagnosed squamous cell carcinoma of the oropharynx.  He is scheduled today for Port-A-Cath and gastrostomy tube placements prior to chemoradiation.  Past Medical History:  Diagnosis Date   Anxiety    Depression    Thyroid disease     No past surgical history on file.  Allergies: Sulfa antibiotics  Medications: Prior to Admission medications   Medication Sig Start Date End Date Taking? Authorizing Provider  cholecalciferol (VITAMIN D3) 25 MCG (1000 UNIT) tablet Take 1,000 Units by mouth daily.    [provider]  clonazePAM (KLONOPIN) 1 MG tablet Take 1 mg by mouth 2 (two) times daily as needed for anxiety (sleep). 06/28/22   [provider]  dexamethasone (DECADRON) 4 MG tablet Take 2 tablets daily x 3 days starting the day after cisplatin chemotherapy. Take with food. 11/23/22   [name], MD  HYDROcodone-acetaminophen (NORCO/VICODIN) 5-325 MG tablet Take 1 tablet by mouth every 6 (six) hours as needed for moderate pain. 12/10/22   [name], MD  levocetirizine (XYZAL) 5 MG tablet Take 5 mg by mouth every evening. 10/21/22   [provider]  levothyroxine (SYNTHROID) 150 MCG tablet Take 150 mcg by mouth daily before breakfast.    [provider]  lidocaine-prilocaine (EMLA) cream Apply to affected area once 11/23/22   Iruku, Arletha Pili, MD  ondansetron (ZOFRAN) 8 MG tablet Take 1 tablet (8 mg total) by mouth every 8 (eight) hours as needed for nausea or vomiting. Start on the third day after cisplatin. 11/23/22   [name], MD  prochlorperazine (COMPAZINE) 10 MG tablet Take 1  tablet (10 mg total) by mouth every 6 (six) hours as needed (Nausea or vomiting). 11/23/22   [name], MD  vitamin E 200 UNIT capsule Take 200 Units by mouth daily.    [provider]  zinc gluconate 50 MG tablet Take 50 mg by mouth daily.    [provider]     No family history on file.  Social History   Socioeconomic History   Marital status: Divorced    Spouse name: Not on file   Number of children: Not on file   Years of education: Not on file   Highest education level: Not on file  Occupational History   Not on file  Tobacco Use   Smoking status: Never   Smokeless tobacco: Never  Substance and Sexual Activity   Alcohol use: Yes   Drug use: Never   Sexual activity: Not on file  Other Topics Concern   Not on file  Social History Narrative   Not on file   Social Determinants of Health   Financial Resource Strain: Medium Risk (12/12/2022)   Overall Financial Resource Strain (CARDIA)    Difficulty of Paying Living Expenses: Somewhat hard  Food Insecurity: No Food Insecurity (12/12/2022)   Hunger Vital Sign    Worried About Running Out of Food in the Last Year: Never true    Ran Out of Food in the Last Year: Never true  Transportation Needs: No Transportation Needs (12/12/2022)   PRAPARE - Hydrologist (Medical): No    Lack of Transportation (Non-Medical): No  Physical Activity: Not  on file  Stress: Not on file  Social Connections: Not on file      Review of Systems denies fever, headache, chest pain, dyspnea, cough, abdominal pain, back pain, vomiting or bleeding.  He does have occasional dysphagia/nausea, weight loss.  Vital Signs: temp 97.7, BP 113/72, O2 sats 96% RA, HR 70       Physical Exam awake, alert.  Chest clear to auscultation bilaterally.  Heart with regular rate and rhythm.  Abdomen soft, positive bowel sounds, nontender.  No lower extremity edema.  Imaging: NM PET Image Initial (PI) Skull Base  To Thigh (F-18 FDG)  Result Date: 11/22/2022 CLINICAL DATA:  Initial treatment strategy for oropharyngeal squamous cell carcinoma. EXAM: NUCLEAR MEDICINE PET SKULL BASE TO THIGH TECHNIQUE: 8.57 mCi F-18 FDG was injected intravenously. Full-ring PET imaging was performed from the skull base to thigh after the radiotracer. CT data was obtained and used for attenuation correction and anatomic localization. Fasting blood glucose: 103 mg/dl COMPARISON:  Neck CT 10/30/2022 FINDINGS: Mediastinal blood pool activity: SUV max 1.99 Liver activity: SUV max NA NECK: Large right-sided oropharyngeal mass beginning in the right palatine tonsillar area and extending all the way down to the supraglottic area. This invades the right parapharyngeal space. SUV max is 11.87. Associated extensive multilevel bilateral neck adenopathy as demonstrated on the CT scan. The right level 1B node has an SUV max of 7.72. Left-sided level 2 adenopathy has an SUV max of 9.84. Small right supraclavicular node measures 7 mm with SUV max of 4.52. Incidental CT findings: None. CHEST: No hypermetabolic mediastinal or hilar nodes. No suspicious pulmonary nodules on the CT scan. Incidental CT findings: Minimal scattered aortic calcifications. No definite coronary artery calcifications. Slightly prominent sternalis muscles are noted. ABDOMEN/PELVIS: No abnormal hypermetabolic activity within the liver, pancreas, adrenal glands, or spleen. No hypermetabolic lymph nodes in the abdomen or pelvis. Incidental CT findings: Scattered atherosclerotic calcifications involving the aorta but no aneurysm. SKELETON: No focal hypermetabolic activity to suggest skeletal metastasis. Incidental CT findings: None. IMPRESSION: 1. Large hypermetabolic right-sided oropharyngeal mass consistent with known squamous cell neoplasm. Associated extensive bilateral multistation hypermetabolic lymphadenopathy. 2. No findings for metastatic disease involving the chest, abdomen/pelvis  or bony structures. Electronically Signed   By: Marijo Sanes M.D.   On: 11/22/2022 08:10    Labs:  CBC: Recent Labs    12/11/22 0745 12/13/22 0752  WBC 9.3 14.7*  HGB 16.1 14.5  HCT 46.7 43.2  PLT 208 212    COAGS: No results for input(s): "INR", "APTT" in the last 8760 hours.  BMP: Recent Labs    11/25/22 0834 12/11/22 0745  NA  --  138  K  --  4.2  CL  --  101  CO2  --  30  GLUCOSE  --  97  BUN 22 19  CALCIUM  --  10.0  CREATININE 1.00 0.86  GFRNONAA >60 >60    LIVER FUNCTION TESTS: No results for input(s): "BILITOT", "AST", "ALT", "ALKPHOS", "PROT", "ALBUMIN" in the last 8760 hours.  TUMOR MARKERS: No results for input(s): "AFPTM", "CEA", "CA199", "CHROMGRNA" in the last 8760 hours.  Assessment and Plan: 67 y.o. male with PMH sig for anxiety/depression ,thyroid disease who presents now with newly diagnosed squamous cell carcinoma of the oropharynx.  He is scheduled today for Port-A-Cath and gastrostomy tube placements prior to chemoradiation.  Details/risks of above procedures, including but not limited to, internal bleeding, infection, injury to adjacent structures, venous thrombosis discussed with patient with his understanding  and consent.   Patient had apparent vasovagal reaction during IV placement this morning per nursing.  He was placed in Trendelenburg position and given IV fluid bolus with good response. BP currently stable.     Addendum: pt had second episode of bradycardia/hypotension while sitting upright in bed, unprovoked; BP responded to IV fluid bolus; pt denies CP,dyspnea; did have some lightheadedness; no known cardiac hx; seen by Dr. Annamaria Boots who recommended transport to ED for further evaluation. Port/G tube placement to be rescheduled.  Thank you for this interesting consult.  I greatly enjoyed meeting Joseangel Nettleton and look forward to participating in their care.  A copy of this report was sent to the requesting provider on this  date.  Electronically Signed: D. Rowe Robert, PA-C 12/13/2022, 8:23 AM   I spent a total of  25 minutes   in face to face in clinical consultation, greater than 50% of which was counseling/coordinating care for Port-A-Cath and gastrostomy tube placements

## 2022-12-13 NOTE — ED Notes (Signed)
Report called to Bartholomew at [facility] ED. Pt transported out of this ED via [facility]

## 2022-12-13 NOTE — Progress Notes (Signed)
Radiation Oncology notified patient sent to the ED for further workup.

## 2022-12-13 NOTE — ED Notes (Signed)
Pt asking for some water. Informed pt that we are unable to give them water at this time. Pt states "If I don't get some water, I'm going to get out of bed and go look for some". Informed pt that I would inquire again.

## 2022-12-13 NOTE — Progress Notes (Signed)
Echocardiogram 2D Echocardiogram has been performed.  Alec Gibson 12/13/2022, 2:41 PM

## 2022-12-13 NOTE — Progress Notes (Addendum)
Patient c/o nausea, sweating, and dizziness after IV stick, patient placed in trendelenburg position, PA Lennette Bihari with Radiology notified and verbal order received for 250 cc bolus of NS

## 2022-12-13 NOTE — ED Provider Notes (Signed)
[facility] DEPT Provider Note   CSN: [id number] Arrival date & time: 12/13/22  0907     History  Chief Complaint  Patient presents with   Near Syncope    Alec Gibson is a 67 y.o. male.  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  Patient noted feeling lightheaded, sweaty and nausea after the IV stick.  He thinks he did lose consciousness and was placed in Trendelenburg position.  There is no head trauma.  He was given a total of 2000 cc of fluids and was feeling generally better however a few minutes later had another repeat episode where he felt faint lightheaded and dizzy.  Because it happened twice he was brought to the ED.  He noted during each of these episodes his blood pressure had dropped.  Now he is asymptomatic.  He denied any chest pain, palpitations or shortness of breath during these episodes.  He denies any recent fevers, vomiting, diarrhea, melena, hematochezia.  He has not eaten or drank since yesterday because he was supposed to remain n.p.o. for the procedure and he did not get much sleep last night.  He denies any focal weakness numbness or tingling or slurred speech or visual change. No h/o stroke, MI or syncope.   Near Syncope       Home Medications Prior to Admission medications   Medication Sig Start Date End Date Taking? Authorizing Provider  cholecalciferol (VITAMIN D3) 25 MCG (1000 UNIT) tablet Take 1,000 Units by mouth daily.    [provider]  clonazePAM (KLONOPIN) 1 MG tablet Take 1 mg by mouth 2 (two) times daily as needed for anxiety (sleep). 06/28/22   [provider]  dexamethasone (DECADRON) 4 MG tablet Take 2 tablets daily x 3 days starting the day after cisplatin chemotherapy. Take with food. 11/23/22   [name], MD  HYDROcodone-acetaminophen  (NORCO/VICODIN) 5-325 MG tablet Take 1 tablet by mouth every 6 (six) hours as needed for moderate pain. 12/10/22   [name], MD  levocetirizine (XYZAL) 5 MG tablet Take 5 mg by mouth every evening. 10/21/22   [provider]  levothyroxine (SYNTHROID) 150 MCG tablet Take 150 mcg by mouth daily before breakfast.    [provider]  lidocaine-prilocaine (EMLA) cream Apply to affected area once 11/23/22   Iruku, Arletha Pili, MD  ondansetron (ZOFRAN) 8 MG tablet Take 1 tablet (8 mg total) by mouth every 8 (eight) hours as needed for nausea or vomiting. Start on the third day after cisplatin. 11/23/22   [name], MD  prochlorperazine (COMPAZINE) 10 MG tablet Take 1 tablet (10 mg total) by mouth every 6 (six) hours as needed (Nausea or vomiting). 11/23/22   [name], MD  vitamin E 200 UNIT capsule Take 200 Units by mouth daily.    [provider]  zinc gluconate 50 MG tablet Take 50 mg by mouth daily.    [provider]      Allergies    Sulfa antibiotics    Review of Systems   Review of Systems  Cardiovascular:  Positive for near-syncope.    Physical Exam Updated Vital Signs BP 118/74   Pulse 100   Temp 98.4 F (36.9 C) (Oral)   Resp 13   SpO2 100%  Physical Exam Constitutional: Alert and oriented. Well appearing and in no distress. Eyes: Conjunctivae are normal. ENT  Head: Normocephalic and atraumatic.      Nose: No congestion.      Mouth/Throat: Mucous membranes are moist.      Neck: No stridor. Cardiovascular: S1, S2,  Normal and symmetric distal pulses are present in all extremities.Warm and well perfused. Respiratory: Normal respiratory effort. Breath sounds are normal. O2 sat 96 on rA Gastrointestinal: Soft and nondistended Musculoskeletal: Normal range of motion in all extremities.      Right lower leg: No tenderness or edema.      Left lower leg: No tenderness or edema. Neurologic: Normal speech and language without  aphasia. AAOx4. PERRL. EOMI without nystagmus. Face symmetrical without droop. CN II-XII intact. Normal facial sensation. Tongue midline. No pronator drift. 5/5 strength in upper and lower extremities. Normal sensation to light touch in all extremities.  No focal neurologic deficits are appreciated. Skin: Skin is warm, dry and intact. No rash noted. Psychiatric: Mood and affect are normal. Speech and behavior are normal.  ED Results / Procedures / Treatments   Labs (all labs ordered are listed, but only abnormal results are displayed) Labs Reviewed  CBC WITH DIFFERENTIAL/PLATELET - Abnormal; Notable for the following components:      Result Value   WBC 14.6 (*)    RBC 3.90 (*)    Hemoglobin 12.3 (*)    HCT 37.1 (*)    Neutro Abs 11.9 (*)    Monocytes Absolute 1.3 (*)    Abs Immature Granulocytes 0.08 (*)    All other components within normal limits  BASIC METABOLIC PANEL  BRAIN NATRIURETIC PEPTIDE  LACTIC ACID, PLASMA  LACTIC ACID, PLASMA  MAGNESIUM  TSH  TROPONIN I (HIGH SENSITIVITY)    EKG EKG Interpretation  Date/Time:  Friday December 13 2022 10:14:25 EST Ventricular Rate:  69 PR Interval:  181 QRS Duration: 97 QT Interval:  398 QTC Calculation: 427 R Axis:   72 Text Interpretation: Sinus rhythm RSR' in V1 or V2, probably normal variant Nonspecific T abnrm, anterolateral leads No previous to compare to Confirmed by Georgina Snell [phone]) on 12/13/2022 11:00:04 AM     Radiology No results found.  Procedures .Critical Care  Performed by: Elgie Congo, MD Authorized by: Elgie Congo, MD   Critical care provider statement:    Critical care time (minutes):  45   Critical care was necessary to treat or prevent imminent or life-threatening deterioration of the following conditions:  Cardiac failure   Critical care was time spent personally by me on the following activities:  Development of treatment plan with patient or surrogate, discussions with  consultants, evaluation of patient's response to treatment, examination of patient, ordering and review of laboratory studies, ordering and review of radiographic studies, ordering and performing treatments and interventions, pulse oximetry, re-evaluation of patient's condition, review of old charts and obtaining history from patient or surrogate   Care discussed with: admitting provider       Medications Ordered in ED Medications  lactated ringers bolus 1,000 mL (0 mLs Intravenous Stopped 12/13/22 1104)  atropine 1 MG/10ML injection (1 mg  Given 12/13/22 1047)    ED Course/ Medical Decision Making/ A&P Clinical Course as of 12/13/22 1136  Fri Dec 13, 2022  1041 Nurse was working on obtaining blood again from patient's IV when he began to feel lightheaded and woozy she called me to the room because his blood pressure dropped to 70/30 and heart rate was in the 40s and then he eventually was asystolic for a brief period of  time.  No CPR initiated or meds given. [VB]  72 S/w Dr Percival Spanish of cardiology who thinks he is having severe vagal episodes leading to brief asystolic episodes.  He does note that he will need admission and cardiology will admit but he will need to go over to [facility]. He recommends starting atropine gtt if happening again. [VB]  1110 S/w Dr Gilford Raid of [facility], ED who has accepted patient for ED to ED transfer to be immediately evaluated by cardiology and admitted. [VB]  9470 Patient being picked up for transfer to ED at [facility] now.  At this time, only results of CBC which is notable for leukocytosis 14.6 likely reactive from recent chemotherapy.  Mild anemia 12.3.  Platelets 168 and normal.  Heart rate is in the 80s, BP 114/77.  Once patient arrives to [facility], ED, cardiology should be immediately paged.  If rpt event happens, start atropine infusion. [VB]    Clinical Course User Index [VB] Elgie Congo, MD                           Medical Decision Making Yves Fodor is a 67 y.o. male.  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   Patient's presentation and story today seemed most suggestive of vasovagal episode after placement of 14-gauge IV into hand. However, while in room with patient, he had recurrent episode and went asystolic for brief period of time while in room more concerning for cardiogenic cause of repeated syncopal episodes.  Obtained strips from episode where he was significant sinus bradycardia and then brief asystole. (See in media)  There was no evidence of obvious heart block.  He was given atropine '1mg'$  with improvement while also actively receiving 1L IVF bolus.  He is not on any antiarrhythmics, no beta-blockers or calcium channel blockers that would contribute.   Pads remain on.  EKG NSR with no evidence of heart block.  S/w Dr Percival Spanish of cardiology who thinks he is having severe vagal episodes leading to brief asystolic episodes.  He does note that he will need admission and cardiology will admit but he will need to go over to [facility]. He recommends starting atropine gtt if happening again.  S/w Dr Gilford Raid of [facility], ED who has accepted patient for ED to ED transfer to be immediately evaluated by cardiology and admitted.  Amount and/or Complexity of Data Reviewed Labs: ordered.    Final Clinical Impression(s) / ED Diagnoses Final diagnoses:  Syncope, unspecified syncope type  Asystole (HCC)  Sinus bradycardia  Vasovagal episode    Rx / DC Orders ED Discharge Orders     None         Elgie Congo, MD 12/13/22 1136

## 2022-12-13 NOTE — ED Triage Notes (Signed)
Pt was in short stay to have a procedure done. Per the RN's in short stay, pt had a vagal response when the IV was started and was hypotensive and diaphoretic. After approx 30 minutes, pt began to have some dizziness again. Pt is alert and oriented upon arrival to the ER, no complaints of dizziness at this time.

## 2022-12-13 NOTE — Telephone Encounter (Signed)
Rn called short stay concerning pt report that he had been taken to the Emergency department prior to his procedure today. According to [employer]  he vagaled twice for them, once with IV start and then again prior to his procedure. According to [employer] his bp was 60/30 with a HR of 30. They gave him a bag of fluid and sent  him to the ED. [facility] called and this treatment was cancelled today. Rn will follow up on this pt.

## 2022-12-13 NOTE — ED Triage Notes (Signed)
Cancer pt going to have gtube and port placed at [facility] today...in pre op when Rn was starting the IV pt got dizzy and felt like he was going to pass out stopped got helped had another episode 2-3 mins later then pre op sent to ED had on monitor the pt brady to 30 and SBP 70/30 and had a complete syncopal episode while ED MD in the room the pt add what was reported to EMS as  "advance vasovagal response" ED called code blue no compressions done when the pt woke up. Atropine 1 mg given all vitals have been stable and NSR in the 90's 130/70 alert and oriented at this time

## 2022-12-13 NOTE — ED Notes (Signed)
This Probation officer in to draw blood work, IV x2 not able to draw back blood, while fixing dressing, pt states he was feeling dizzy and nauseated again. HR 30s, hypotensive 60R systolic. MD notified, at bedside. While at bedside, pt became more diaphoretic, HR dropped, showing 0 on monitor, pt responsive again, HR 20s-30s. Zoll pads placed on pt, pt given '1mg'$  atropine.

## 2022-12-13 NOTE — H&P (Cosign Needed Addendum)
Date: 12/13/2022               Patient Name:  Alec Gibson MRN: [id number]  DOB: 10-17-1956 Age / Sex: 67 y.o., male   PCP: [name], NP         Medical Service: Internal Medicine Teaching Service         Attending Physician: Dr. Lottie Mussel, MD    First Contact: Dr. Claudia Desanctis Momen Ham Pager: [phone]  Second Contact: Dr. Delene Ruffini Pager: [phone]       After Hours (After 5p/  First Contact Pager: [phone]  weekends / holidays): Second Contact Pager: [phone]   Chief Complaint: Syncope   History of Present Illness:   Alec Gibson is a 67 y/o male with past medical history of Oropharyngeal SCC (currently receiving chemo/radiation), hypothyroidism, anxiety, and depression who presents following a syncopal episode.  Patient was lying on the table in preparation for chemo port/PEG tube placement with IR at [facility] today when staff noted a syncopal episode. Patient reports that these events occurred following IV placement. Patient noted feeling lightheaded, nauseous, and diaphoretic prior to the syncopal episode.  He denies chest pain or palpitations prior to this episode. He did not fall or hit his head. He was then given 2L fluid bolus but had another syncopal episode shortly after. Staff reported that during the second episode his BP dropped to 70/30 w/ HR in the 40s. Patient eventually when asystolic for a brief period of time. No CPR initiated but '1mg'$  atropine was administered. Patient then improved and was brought to the ED. No reports of seizure-like activity. Patient denies prior history of seizures.   He states that he has never experienced a syncopal episode previously. He is currently asymptomatic. Patient states that he was n.p.o. at midnight today for his procedure. He states that he experienced an episode of bowel incontinence last night that he attributes to the bowel prep for his procedure today. He states that he has not eaten much over the last 2 days given his  nausea and decreased appetite. He states that he is still able to tolerate PO food/liquids but was having the gastrostomy tube placed in preparation for future issues with swallowing related to radiation. Patient states that the only recent medication change was taking Zofran for the first time yesterday.  Patient denies fever, chills, cough, vomiting, abdominal pain, diarrhea, dysuria, weakness, or headache.  Patient is currently undergoing chemotherapy and radiation for his oropharyngeal squamous cell carcinoma.  He underwent his third session of radiation on 12/10/2022 and first of 5 session of chemo on 12/11/2022. Patient reports that he is scheduled to receive chemo/radiation for another 7 weeks.   ED Course:  -CBC significant for leukocytosis of 14.7 -BMP relatively unremarkable -TSH elevated at 33.8 -Mg WNL -PT/INR WNL -Troponin WNL -BNP WNL -Lactic acid WNL -EKG demonstrates sinus rhythm with RSR in V1/V2 -Cardiology consulted -1L LR bolus -1L NS bolus  Review of Systems: A complete ROS was negative except as per HPI.    Meds:  Current Meds  Medication Sig   Cholecalciferol (VITAMIN D-3 PO) Take 1 capsule by mouth in the morning.   clonazePAM (KLONOPIN) 1 MG tablet Take 1 mg by mouth 2 (two) times daily as needed for anxiety (sleep).   HYDROcodone-acetaminophen (NORCO/VICODIN) 5-325 MG tablet Take 1 tablet by mouth every 6 (six) hours as needed for moderate pain.   levocetirizine (XYZAL) 5 MG tablet Take 5 mg by mouth in the  morning.   levothyroxine (SYNTHROID) 150 MCG tablet Take 150 mcg by mouth daily before breakfast.   Multiple Vitamin (MULTIVITAMIN) tablet Take 1 tablet by mouth in the morning.   Multiple Vitamins-Minerals (ZINC PO) Take 1 tablet by mouth in the morning.   Omega-3 Fatty Acids (FISH OIL PO) Take 1 capsule by mouth in the morning.   ondansetron (ZOFRAN) 8 MG tablet Take 1 tablet (8 mg total) by mouth every 8 (eight) hours as needed for nausea or vomiting.  Start on the third day after cisplatin.   VITAMIN E PO Take 1 capsule by mouth in the morning.     Allergies: Allergies as of 12/13/2022 - Review Complete 12/13/2022  Allergen Reaction Noted   Sulfa antibiotics Other (See Comments) 07/26/2018   Past Medical History:  Diagnosis Date   Anxiety    Depression    Thyroid disease    Surgical History: right wrist fracture  Family History: MI (grandfather @ 91). Denies family history of heart failure, DVT, stroke, or cancer.   Social History: Patient currently lives alone at home.  Independent of all ADLs/IADLs.  He ambulates without assistance.  He currently works at an [employer].  He reports 3 years of smoking in his 86s with approximately 1 pack every 3 days.  He drinks approximately 5 beers each month.  He smokes marijuana daily.  Denies other current or prior recreational drug use.  Physical Exam: Blood pressure 114/67, pulse 86, temperature 98.1 F (36.7 C), temperature source Oral, resp. rate 16, SpO2 96 %. Constitutional: Well-developed, well-nourished, appears comfortable  HENT: Normocephalic and atraumatic. Mass in right oropharynx that is not obstructing the airway. Dry mucous membranes.  Eyes: EOMI. PERRL.  Neck: Normal range of motion. Left cervical lymphadenopathy. No thyromegaly.  Cardiovascular: Regular rate, regular rhythm. No murmurs, rubs, or gallops. Normal radial and PT pulses bilaterally. No LE edema.  Pulmonary: Normal respiratory effort. No wheezes, rales, rhonchi, or crackles. On RA. Abdominal: Soft. Non-distended. No tenderness. Normal bowel sounds.  Musculoskeletal: Normal range of motion.     Neurological: Alert and oriented to person, place, and time. 5/5 strength of all extremities. Normal sensation. Normal heel-to-shin bilaterally.  Skin: warm and dry. Normal skin turgor.   EKG: personally reviewed my interpretation is sinus rhythm with RSR in V1/V2.  Assessment & Plan by Problem: Principal Problem:    Syncope  Alec Gibson is a 67 y/o male with past medical history of Oropharyngeal SCC (currently receiving chemo/radiation), hypothyroidism, anxiety, and depression who presents following a syncopal episode and was admitted for further evaluation of his syncope.   #Syncope Patient presents following 2X syncopal episodes.  Patient was noted to be hypotensive and bradycardic during the second episode.  He was given 2 L IV fluid bolus but became asystolic for a brief period of time.  Patient was given 1 mg atropine, improved, and was brought to the ED.  BMP and Mg was unremarkable. PT/INR WNL. Troponin, BNP, and lactic acid WNL. EKG demonstrates normal sinus rhythm with RSR in V1/V2. No evidence of arrhythmia on EKG. No Qtc prolongation. Low suspicion for ACS or acute heart failure exacerbation at this time. Cardiology was consulted by the ED and suspects vagal etiology with plan for echo. Echo demonstrates EF 60-65% without abnormalities. Given patient's decrease in PO intake, being NPO for procedure, and symptoms prior to syncope, suspect his syncopal episodes were likely vasovagal in nature. He reports that he is currently tolerating PO well. Will encourage increased PO intake. -  F/u cardiology recommendations -Encourage PO intake -Orthostatic VS in the AM -Telemetry  #Oropharyngeal SCC (currently receiving chemo/radiation) Patient follows with Dr. [name] with at [facility].  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 PET on 12/14 with no evidence of metastatic disease but does have extensive bilateral multi-station hypermetabolic lymphadenopathy. He underwent his third session of radiation on 12/10/2022 and first session of chemo (w/ cisplatin) on 12/11/2022.  I spoke with the on-call oncologists at [facility] (Dr. Lisbeth Renshaw, Dr. Lindi Adie), who stated he will likely not require radiation or chemotherapy while  hospitalized if he is to be discharged in the near future. They state that placement of the PEG tube/port-a-cath while hospitalized would be beneficial if he requires a prolonged stay. He receives Norco 5-325 mg q6 hours PRN for pain control at home.  He is currently not complaining of pain.  Will hold Norco at this time given ongoing workup for syncope. -Possible IR consult tomorrow for PEG tube/port-a-cath placement -Will reach out to patients oncologist Dr. Chryl Heck regarding her plan for the patient while hospitalized  #Hypothyroidism Patient takes levothyroxine 150 mcg daily.  He reports being compliant with this medication. TSH elevated at 33.8 on admission. Potentially related to his syncopal episode in the setting of recent initiation of radiation for [facility].  -Pending free T4 -Continue levothyroxine 150 mcg daily  #Anxiety #Depression #Insomnia Patient reports taking Klonopin 1 mg twice daily PRN for anxiety.  -Continue home Klonopin   Diet: Regular Bowel: none VTE: Lovenox IVF: none Code: Full   Prior to Admission Living Arrangement: at home by himself Anticipated Discharge Location: TBD Barriers to Discharge: continued management  Dispo: Admit patient to Observation with expected length of stay less than 2 midnights.  Signed: Starlyn Skeans, MD 12/13/2022, 7:06 PM  Pager: [phone] After 5pm on weekdays and 1pm on weekends: On Call pager: [phone]

## 2022-12-13 NOTE — ED Provider Notes (Addendum)
Patient initially seen by Dr. Nechama Guard, please see her note.  Pt sent from [facility] ED to be admitted by cardiology for episodes of bradycardia.  Pt is in no distress at this time.  HR in the 70s on the monitor.  BP normal  Discussed with Trish, Cardiology team.  Cardiology will come see pt.   Dorie Rank, MD 12/13/22 1220  Notified pt's heart rate and BP dropped.  HR now back in the 70s at bedside.  BP cycling.  Pt appears comfortable.  Will continue to monitor while waiting for cardiology admission   Dorie Rank, MD 12/13/22 1311  Reviewed cardiology note, medical admission recommended   Dorie Rank, MD 12/13/22 1416

## 2022-12-14 ENCOUNTER — Other Ambulatory Visit: Payer: Self-pay

## 2022-12-14 DIAGNOSIS — R55 Syncope and collapse: Secondary | ICD-10-CM

## 2022-12-14 LAB — BASIC METABOLIC PANEL
Anion gap: 6 (ref 5–15)
BUN: 13 mg/dL (ref 8–23)
CO2: 23 mmol/L (ref 22–32)
Calcium: 8.6 mg/dL — ABNORMAL LOW (ref 8.9–10.3)
Chloride: 102 mmol/L (ref 98–111)
Creatinine, Ser: 0.69 mg/dL (ref 0.61–1.24)
GFR, Estimated: >60 mL/min (ref >60)
Glucose, Bld: 101 mg/dL — ABNORMAL HIGH (ref 70–99)
Potassium: 3.5 mmol/L (ref 3.5–5.1)
Sodium: 131 mmol/L — ABNORMAL LOW (ref 135–145)

## 2022-12-14 LAB — CBC
HCT: 39.1 % (ref 39.0–52.0)
Hemoglobin: 13.2 g/dL (ref 13.0–17.0)
MCH: 31.1 pg (ref 26.0–34.0)
MCHC: 33.8 g/dL (ref 30.0–36.0)
MCV: 92 fL (ref 80.0–100.0)
Platelets: 182 10*3/uL (ref 150–400)
RBC: 4.25 MIL/uL (ref 4.22–5.81)
RDW: 13.7 % (ref 11.5–15.5)
WBC: 11.8 10*3/uL — ABNORMAL HIGH (ref 4.0–10.5)
nRBC: 0 % (ref 0.0–0.2)

## 2022-12-14 LAB — CREATININE, SERUM
Creatinine, Ser: 0.8 mg/dL (ref 0.61–1.24)
GFR, Estimated: >60 mL/min (ref >60)

## 2022-12-14 LAB — HIV ANTIBODY (ROUTINE TESTING W REFLEX): HIV Screen 4th Generation wRfx: NONREACTIVE

## 2022-12-14 LAB — T4, FREE: Free T4: 0.58 ng/dL — ABNORMAL LOW (ref 0.61–1.12)

## 2022-12-14 MED ORDER — LEVOTHYROXINE SODIUM 175 MCG PO TABS: 175.0000 ug | ORAL_TABLET | Freq: Every day | ORAL | 0 refills | Status: DC

## 2022-12-14 NOTE — Discharge Instructions (Addendum)
You were hospitalized for Syncope.  Hospital Course: We obtained an ultrasound of your heart that demonstrated normal function. You did not have abnormal rhythms on EKG. You were also seen by the heart doctors (cardiology) who suspect that the reason you passed out was likely a vagal reaction.  Dehydration may have contributed to you passing out.  Please ensure that you eat and drink plenty of fluids as much as you can tolerate at home.  Your thyroid labs were abnormal here.  We increased your dose of levothyroxine.  Please follow-up with your primary care provider as you will need repeat thyroid labs in 4-6 weeks to adjust your dose of levothyroxine.  Medications:  Please start taking: -Levothyroxine 175 mcg daily  Please stop taking: -Levothyroxine 150 mcg daily  Please continue taking: -Klonopin 1 mg twice daily as needed -Decadron 4 mg daily (2 tablets daily x 3 days starting the day after cisplatin chemotherapy) -Omega-3 fatty acids -Hydrocodone-acetaminophen 5-325 mg (1 tablet every 6 hours as needed) -Levocetirizine 5 mg daily -Lidocaine-prilocaine cream -Multivitamin -Zofran 8 mg (1 tablet every 8 hours as needed) -Compazine 10 mg tablet (1 tablet every 6 hours as needed) -Cholecalciferol -Vitamin E -Multiple vitamin-minerals (zinc)  Follow-up: - Please follow up with your primary care provider Bradly Bienenstock, NP within the next 1-2 weeks (please call to schedule an appointment).  - Please follow up with your oncologist Dr. Arletha Pili Iruku within the next week (please call to schedule an appointment).

## 2022-12-14 NOTE — Progress Notes (Signed)
Pt discharged to home. DC instructions given. No concerns voiced. Pt left unit in wheelchair pushed by nurse tech. Left in stable condition.

## 2022-12-14 NOTE — Progress Notes (Signed)
Rounding Note    Patient Name: Alec Gibson Date of Encounter: 12/14/2022  [place] Cardiologist: None   Subjective   NAEO. Feeling better today.  Inpatient Medications    Scheduled Meds:  enoxaparin (LOVENOX) injection  40 mg Subcutaneous Q24H   levothyroxine  150 mcg Oral QAC breakfast   Continuous Infusions:  PRN Meds: acetaminophen, clonazePAM, ondansetron (ZOFRAN) IV   Vital Signs    Vitals:   12/13/22 2217 12/14/22 0048 12/14/22 0529 12/14/22 0835  BP: 122/69 110/65 111/62 106/63  Pulse: 87 73 69 64  Resp: '16 18 16 18  '$ Temp: 98.1 F (36.7 C) 98.4 F (36.9 C) 97.9 F (36.6 C) (!) 97.1 F (36.2 C)  TempSrc: Oral Oral Oral Oral  SpO2:  95% 97% 95%    Intake/Output Summary (Last 24 hours) at 12/14/2022 0936 Last data filed at 12/14/2022 0647 Gross per 24 hour  Intake 1000 ml  Output 1450 ml  Net -450 ml      12/13/2022    8:15 AM 12/11/2022    8:37 AM 12/10/2022    9:48 AM  Last 3 Weights  Weight (lbs) 152 lb 4 oz 152 lb 4 oz 152 lb 9.6 oz  Weight (kg) 69.06 kg 69.06 kg 69.219 kg      Telemetry    Reviewed. Sinus rhythm.  - Personally Reviewed  ECG    Personally Reviewed  Physical Exam   GEN: No acute distress.   Neck: No JVD Cardiac: RRR, no murmurs, rubs, or gallops.  Respiratory: Clear to auscultation bilaterally. GI: Soft, nontender, non-distended  MS: No edema; No deformity. Neuro:  Nonfocal  Psych: Normal affect   Labs    High Sensitivity Troponin:   Recent Labs  Lab 12/13/22 1044 12/13/22 1720  TROPONINIHS <2 3     Chemistry Recent Labs  Lab 12/11/22 0745 12/13/22 1044 12/13/22 2235 12/14/22 0230  NA 138 134*  --  131*  K 4.2 4.3  --  3.5  CL 101 103  --  102  CO2 30 23  --  23  GLUCOSE 97 99  --  101*  BUN 19 21  --  13  CREATININE 0.86 0.83 0.80 0.69  CALCIUM 10.0 8.3*  --  8.6*  MG 2.2 2.2  --   --   GFRNONAA >60 >60 >60 >60  ANIONGAP 7 8  --  6    Lipids No results for input(s): "CHOL", "TRIG",  "HDL", "LABVLDL", "LDLCALC", "CHOLHDL" in the last 168 hours.  Hematology Recent Labs  Lab 12/13/22 1044 12/13/22 2235 12/14/22 0230  WBC 14.6* 12.1* 11.8*  RBC 3.90* 4.24 4.25  HGB 12.3* 13.3 13.2  HCT 37.1* 39.4 39.1  MCV 95.1 92.9 92.0  MCH 31.5 31.4 31.1  MCHC 33.2 33.8 33.8  RDW 14.1 13.9 13.7  PLT 168 199 182   Thyroid  Recent Labs  Lab 12/13/22 1049 12/13/22 2235  TSH 33.847*  --   FREET4  --  0.58*    BNP Recent Labs  Lab 12/13/22 1044  BNP 30.8    DDimer No results for input(s): "DDIMER" in the last 168 hours.   Radiology    ECHOCARDIOGRAM COMPLETE  Result Date: 12/13/2022    ECHOCARDIOGRAM REPORT   Patient Name:   Alec Gibson Date of Exam: 12/13/2022 Medical Rec #:  [id number]   Height:       69.0 in Accession #:    [id number]  Weight:       152.2 lb  Date of Birth:  05/02/1956    BSA:          1.840 m Patient Age:    67 years    BP:           96/65 mmHg Patient Gender: M           HR:           67 bpm. Exam Location:  Inpatient Procedure: 2D Echo, Cardiac Doppler and Color Doppler Indications:   Syncope R55  History:       Patient has no prior history of Echocardiogram examinations.                Cancer.  Sonographer:   Ronny Flurry Referring      [place]: IMPRESSIONS  1. Left ventricular ejection fraction, by estimation, is 60 to 65%. The left ventricle has normal function. The left ventricle has no regional wall motion abnormalities. Left ventricular diastolic parameters are indeterminate.  2. Right ventricular systolic function is normal. The right ventricular size is normal.  3. No evidence of mitral valve regurgitation.  4. The aortic valve is tricuspid. Aortic valve regurgitation is not visualized.  5. The inferior vena cava is normal in size with greater than 50% respiratory variability, suggesting right atrial pressure of 3 mmHg. Comparison(s): No prior Echocardiogram. FINDINGS  Left Ventricle: Left ventricular ejection fraction, by estimation,  is 60 to 65%. The left ventricle has normal function. The left ventricle has no regional wall motion abnormalities. The left ventricular internal cavity size was normal in size. There is  no left ventricular hypertrophy. Left ventricular diastolic parameters are indeterminate. Right Ventricle: The right ventricular size is normal. Right ventricular systolic function is normal. Left Atrium: Left atrial size was normal in size. Right Atrium: Right atrial size was normal in size. Pericardium: There is no evidence of pericardial effusion. Mitral Valve: No evidence of mitral valve regurgitation. Tricuspid Valve: Tricuspid valve regurgitation is not demonstrated. Aortic Valve: The aortic valve is tricuspid. Aortic valve regurgitation is not visualized. Aortic valve mean gradient measures 2.0 mmHg. Aortic valve peak gradient measures 3.8 mmHg. Aortic valve area, by VTI measures 3.09 cm. Pulmonic Valve: Pulmonic valve regurgitation is not visualized. Aorta: The aortic root and ascending aorta are structurally normal, with no evidence of dilitation. Venous: The inferior vena cava is normal in size with greater than 50% respiratory variability, suggesting right atrial pressure of 3 mmHg. IAS/Shunts: No atrial level shunt detected by color flow Doppler.  LEFT VENTRICLE PLAX 2D LVIDd:         4.00 cm   Diastology LVIDs:         3.00 cm   LV e' medial:    8.05 cm/s LV PW:         0.90 cm   LV E/e' medial:  10.4 LV IVS:        0.70 cm   LV e' lateral:   9.14 cm/s LVOT diam:     2.00 cm   LV E/e' lateral: 9.1 LV SV:         62 LV SV Index:   33 LVOT Area:     3.14 cm  RIGHT VENTRICLE             IVC RV S prime:     13.80 cm/s  IVC diam: 1.50 cm TAPSE (M-mode): 2.2 cm LEFT ATRIUM           Index  RIGHT ATRIUM           Index LA diam:      3.10 cm 1.68 cm/m   RA Area:     14.30 cm LA Vol (A2C): 15.3 ml 8.32 ml/m   RA Volume:   36.30 ml  19.73 ml/m LA Vol (A4C): 30.6 ml 16.63 ml/m  AORTIC VALVE AV Area (Vmax):    2.98  cm AV Area (Vmean):   2.87 cm AV Area (VTI):     3.09 cm AV Vmax:           97.70 cm/s AV Vmean:          63.400 cm/s AV VTI:            0.199 m AV Peak Grad:      3.8 mmHg AV Mean Grad:      2.0 mmHg LVOT Vmax:         92.63 cm/s LVOT Vmean:        57.867 cm/s LVOT VTI:          0.196 m LVOT/AV VTI ratio: 0.98  AORTA Ao Root diam: 3.40 cm Ao Asc diam:  3.40 cm MITRAL VALVE MV Area (PHT): 3.60 cm    SHUNTS MV Decel Time: 211 msec    Systemic VTI:  0.20 m MV E velocity: 83.40 cm/s  Systemic Diam: 2.00 cm MV A velocity: 80.00 cm/s MV E/A ratio:  1.04 Mary Scientist, physiological signed by Phineas Inches Signature Date/Time: 12/13/2022/3:09:21 PM    Final       Assessment & Plan    Mr Shewell is a 67yo man admitted after vasovagal episode while having IV manipulated.   #Vasovagal syncope No recurrence.  Echo normal. Have discussed importance of recognizing early symptoms and getting to the ground to prevent personal injury. Avoid triggers if possible. Maintain adequate hydration.  OK to discharge from a cardiology perspective. Cardiology will sign off. Please call with questions/concerns.   For questions or updates, please contact [place] Please consult [email or web address] for contact info under        Signed, Vickie Epley, MD  12/14/2022, 9:36 AM

## 2022-12-14 NOTE — Discharge Summary (Signed)
Name: Alec Gibson MRN: [id number] DOB: 1956/10/25 67 y.o. PCP: [name], NP  Date of Admission: 12/13/2022  9:07 AM Date of Discharge: 12/14/2022 Attending Physician: DR. Velna Ochs  Discharge Diagnosis: 1. Principal Problem:   Syncope Active Problems:   Squamous cell carcinoma of oropharynx (HCC) Hypothyroidism Anxiety Depression Insomnia  Discharge Medications: Allergies as of 12/14/2022       Reactions   Sulfa Antibiotics Other (See Comments)   Childhood allergy Unknown reaction        Medication List     TAKE these medications    clonazePAM 1 MG tablet Commonly known as: KLONOPIN Take 1 mg by mouth 2 (two) times daily as needed for anxiety (sleep).   dexamethasone 4 MG tablet Commonly known as: DECADRON Take 2 tablets daily x 3 days starting the day after cisplatin chemotherapy. Take with food.   FISH OIL PO Take 1 capsule by mouth in the morning.   HYDROcodone-acetaminophen 5-325 MG tablet Commonly known as: NORCO/VICODIN Take 1 tablet by mouth every 6 (six) hours as needed for moderate pain.   levocetirizine 5 MG tablet Commonly known as: XYZAL Take 5 mg by mouth in the morning.   levothyroxine 175 MCG tablet Commonly known as: SYNTHROID Take 1 tablet (175 mcg total) by mouth daily before breakfast. What changed:  medication strength how much to take   lidocaine-prilocaine cream Commonly known as: EMLA Apply to affected area once   multivitamin tablet Take 1 tablet by mouth in the morning.   ondansetron 8 MG tablet Commonly known as: Zofran Take 1 tablet (8 mg total) by mouth every 8 (eight) hours as needed for nausea or vomiting. Start on the third day after cisplatin.   prochlorperazine 10 MG tablet Commonly known as: COMPAZINE Take 1 tablet (10 mg total) by mouth every 6 (six) hours as needed (Nausea or vomiting).   VITAMIN D-3 PO Take 1 capsule by mouth in the morning.   VITAMIN E PO Take 1 capsule by mouth in the  morning.   ZINC PO Take 1 tablet by mouth in the morning.        Disposition and follow-up:   Mr.Hue Spagnolo was discharged from [facility] in Good condition.  At the hospital follow up visit please address:  1.    A. Syncope   - Likely vasovagal   B. Hypothyroidism   - Increased to levothyroxine 175 mcg daily  2.  Labs / imaging needed at time of follow-up: BMP  3.  Pending labs/ test needing follow-up: none  Follow-up Appointments: - Please follow up with your primary care provider Bradly Bienenstock, NP within the next 1-2 weeks (please call to schedule an appointment).   - Please follow up with your oncologist Dr. Arletha Pili Iruku within the next week (please call to schedule an appointment).  Hospital Course by problem list:  [name] is a 67 y/o male with past medical history of Oropharyngeal SCC (currently receiving chemo/radiation), hypothyroidism, anxiety, and depression who presents following a syncopal episode and was admitted for further evaluation of his syncope.    #Syncope Presented after 2X syncopal episodes during preparation for chemo port/PEG tube placement with IR at [facility]. Hypotensive/bradycardic during the second episode. Given 2 L IV fluid bolus, then became asystolic for brief moment. Received 1 mg atropine, improved, then brought to the ED. Cardiology consulted and suspected vagal etiology. EF 60-65% without abnormalities on echo. Suspect syncope likely vasovagal in setting of recently decreased PO  intake. Negative orthostatic vital signs. No events on telemetry. No cardiology f/u required.    #Oropharyngeal SCC (currently receiving chemo/radiation) Follows w/ Dr. Arletha Pili Iruku with at [facility].  Recent diagnosis of bilateral oropharyngeal squamous cell carcinoma. No evidence of metastatic disease on PET but does have extensive bilateral multi-station hypermetabolic lymphadenopathy. Underwent  third radiation session on 12/10/2022 and first chemo session (w/ cisplatin) on 12/11/2022. Patient will continue w/ radiation/chemotherapy following discharge. Patient will require PEG tube/port-a-cath placement as outpatient. No changes made to outpatient medication regimen for this condition.    #Hypothyroidism On levothyroxine 150 mcg daily at home. Reported compliance with this medication (taking daily, in the morning, prior to eating, no missed doses). TSH 33.8 on admission. T4 decreased at 0.58. Concerning for subtherapeutic levothyroxine. Discharged patient on levothyroxine 175 mcg daily with plan to f/u with PCP within the next 1-2 weeks to have thyroid function reassessed within 4-6 weeks.    #Anxiety #Depression #Insomnia Takes Klonopin 1 mg twice daily PRN for anxiety at home. Continued throughout hospitalization and at discharge.    Discharge Exam:   BP 106/63 (BP Location: Right Arm)   Pulse 64   Temp (!) 97.1 F (36.2 C) (Oral)   Resp 18   SpO2 95%  Constitutional: Well-developed, well-nourished, appears comfortable  Cardiovascular: Regular rate, regular rhythm. No murmurs, rubs, or gallops. Normal radial and PT pulses bilaterally. No LE edema.  Pulmonary: Normal respiratory effort. No wheezes, rales, rhonchi, or crackles. On RA. Abdominal: Soft. Non-distended. No tenderness. Normal bowel sounds.  Musculoskeletal: Normal range of motion.     Neurological: Alert and oriented to person, place, and time. Non-focal. Skin: warm and dry.   Pertinent Labs, Studies, and Procedures:      Latest Ref Rng & Units 12/14/2022    2:30 AM 12/13/2022   10:35 PM 12/13/2022   10:44 AM  CBC  WBC 4.0 - 10.5 K/uL 11.8  12.1  14.6   Hemoglobin 13.0 - 17.0 g/dL 13.2  13.3  12.3   Hematocrit 39.0 - 52.0 % 39.1  39.4  37.1   Platelets 150 - 400 K/uL 182  199  168        Latest Ref Rng & Units 12/14/2022    2:30 AM 12/13/2022   10:35 PM 12/13/2022   10:44 AM  BMP  Glucose 70 - 99 mg/dL 101   99    BUN 8 - 23 mg/dL 13   21   Creatinine 0.61 - 1.24 mg/dL 0.69  0.80  0.83   Sodium 135 - 145 mmol/L 131   134   Potassium 3.5 - 5.1 mmol/L 3.5   4.3   Chloride 98 - 111 mmol/L 102   103   CO2 22 - 32 mmol/L 23   23   Calcium 8.9 - 10.3 mg/dL 8.6   8.3     ECHOCARDIOGRAM COMPLETE   IMPRESSIONS     1. Left ventricular ejection fraction, by estimation, is 60 to 65%. The  left ventricle has normal function. The left ventricle has no regional  wall motion abnormalities. Left ventricular diastolic parameters are  indeterminate.   2. Right ventricular systolic function is normal. The right ventricular  size is normal.   3. No evidence of mitral valve regurgitation.   4. The aortic valve is tricuspid. Aortic valve regurgitation is not  visualized.   5. The inferior vena cava is normal in size with greater than 50%  respiratory variability,  suggesting right atrial pressure of 3 mmHg.  On: 12/13/2022  Discharge Instructions: Discharge Instructions     Call MD for:  difficulty breathing, headache or visual disturbances   Complete by: As directed    Call MD for:  extreme fatigue   Complete by: As directed    Call MD for:  persistant dizziness or light-headedness   Complete by: As directed    Call MD for:  persistant nausea and vomiting   Complete by: As directed    Call MD for:  severe uncontrolled pain   Complete by: As directed    Diet - low sodium heart healthy   Complete by: As directed    Increase activity slowly   Complete by: As directed       You were hospitalized for Syncope.   Hospital Course: We obtained an ultrasound of your heart that demonstrated normal function. You did not have abnormal rhythms on EKG. You were also seen by the heart doctors (cardiology) who suspect that the reason you passed out was likely a vagal reaction.  Dehydration may have contributed to you passing out.  Please ensure that you eat and drink plenty of fluids as much as you can tolerate at  home.  Your thyroid labs were abnormal here.  We increased your dose of levothyroxine.  Please follow-up with your primary care provider as you will need repeat thyroid labs in 4-6 weeks to adjust your dose of levothyroxine.   Medications:   Please start taking: -Levothyroxine 175 mcg daily   Please stop taking: -Levothyroxine 150 mcg daily   Please continue taking: -Klonopin 1 mg twice daily as needed -Decadron 4 mg daily (2 tablets daily x 3 days starting the day after cisplatin chemotherapy) -Omega-3 fatty acids -Hydrocodone-acetaminophen 5-325 mg (1 tablet every 6 hours as needed) -Levocetirizine 5 mg daily -Lidocaine-prilocaine cream -Multivitamin -Zofran 8 mg (1 tablet every 8 hours as needed) -Compazine 10 mg tablet (1 tablet every 6 hours as needed) -Cholecalciferol -Vitamin E -Multiple vitamin-minerals (zinc)   Follow-up: - Please follow up with your primary care provider Bradly Bienenstock, NP within the next 1-2 weeks (please call to schedule an appointment).   - Please follow up with your oncologist Dr. Arletha Pili Iruku within the next week (please call to schedule an appointment).  SignedStarlyn Skeans, MD 12/14/2022, 4:41 PM   Pager: [phone]

## 2022-12-16 ENCOUNTER — Other Ambulatory Visit: Payer: Self-pay | Admitting: Radiation Oncology

## 2022-12-16 ENCOUNTER — Inpatient Hospital Stay: Payer: Medicare Other

## 2022-12-16 ENCOUNTER — Other Ambulatory Visit: Payer: Self-pay

## 2022-12-16 ENCOUNTER — Ambulatory Visit
Admission: RE | Admit: 2022-12-16 | Discharge: 2022-12-16 | Disposition: A | Discharge status: Home / Self Care | Payer: Medicare Other | Source: Ambulatory Visit | Attending: Radiation Oncology | Admitting: Radiation Oncology

## 2022-12-16 DIAGNOSIS — C090 Malignant neoplasm of tonsillar fossa: Secondary | ICD-10-CM | POA: Diagnosis not present

## 2022-12-16 DIAGNOSIS — Z510 Encounter for antineoplastic radiation therapy: Secondary | ICD-10-CM | POA: Diagnosis not present

## 2022-12-16 DIAGNOSIS — C109 Malignant neoplasm of oropharynx, unspecified: Secondary | ICD-10-CM

## 2022-12-16 DIAGNOSIS — Z5111 Encounter for antineoplastic chemotherapy: Secondary | ICD-10-CM | POA: Diagnosis not present

## 2022-12-16 LAB — CBC WITH DIFFERENTIAL (CANCER CENTER ONLY)
Abs Immature Granulocytes: 0.06 10*3/uL (ref 0.00–0.07)
Basophils Absolute: 0 10*3/uL (ref 0.0–0.1)
Basophils Relative: 0 %
Eosinophils Absolute: 0 10*3/uL (ref 0.0–0.5)
Eosinophils Relative: 0 %
HCT: 43.4 % (ref 39.0–52.0)
Hemoglobin: 15.2 g/dL (ref 13.0–17.0)
Immature Granulocytes: 1 %
Lymphocytes Relative: 12 %
Lymphs Abs: 1.4 10*3/uL (ref 0.7–4.0)
MCH: 31.7 pg (ref 26.0–34.0)
MCHC: 35 g/dL (ref 30.0–36.0)
MCV: 90.4 fL (ref 80.0–100.0)
Monocytes Absolute: 1.5 10*3/uL — ABNORMAL HIGH (ref 0.1–1.0)
Monocytes Relative: 13 %
Neutro Abs: 8.8 10*3/uL — ABNORMAL HIGH (ref 1.7–7.7)
Neutrophils Relative %: 74 %
Platelet Count: 202 10*3/uL (ref 150–400)
RBC: 4.8 MIL/uL (ref 4.22–5.81)
RDW: 13.3 % (ref 11.5–15.5)
WBC Count: 11.9 10*3/uL — ABNORMAL HIGH (ref 4.0–10.5)
nRBC: 0 % (ref 0.0–0.2)

## 2022-12-16 LAB — RAD ONC ARIA SESSION SUMMARY
Course Elapsed Days: 6
Plan Fractions Treated to Date: 4
Plan Prescribed Dose Per Fraction: 2 Gy
Plan Total Fractions Prescribed: 35
Plan Total Prescribed Dose: 70 Gy
Reference Point Dosage Given to Date: 8 Gy
Reference Point Session Dosage Given: 2 Gy
Session Number: 4

## 2022-12-16 LAB — BASIC METABOLIC PANEL - CANCER CENTER ONLY
Anion gap: 5 (ref 5–15)
BUN: 23 mg/dL (ref 8–23)
CO2: 31 mmol/L (ref 22–32)
Calcium: 9.9 mg/dL (ref 8.9–10.3)
Chloride: 97 mmol/L — ABNORMAL LOW (ref 98–111)
Creatinine: 0.94 mg/dL (ref 0.61–1.24)
GFR, Estimated: >60 mL/min (ref >60)
Glucose, Bld: 91 mg/dL (ref 70–99)
Potassium: 3.8 mmol/L (ref 3.5–5.1)
Sodium: 133 mmol/L — ABNORMAL LOW (ref 135–145)

## 2022-12-16 LAB — MAGNESIUM: Magnesium: 2 mg/dL (ref 1.7–2.4)

## 2022-12-16 MED ORDER — LIDOCAINE VISCOUS HCL 2 % MT SOLN: OROMUCOSAL | 3 refills | Status: DC

## 2022-12-16 NOTE — Progress Notes (Unsigned)
[facility] CONSULT NOTE  Patient Care Team: [name], NP as PCP - General (Family Medicine) [name], [name], RN as Oncology Nurse Navigator [name], MD as Consulting Physician (Hematology and Oncology) [name], MD as Consulting Physician (Radiation Oncology) [name], MD as Consulting Physician (Otolaryngology)  CHIEF COMPLAINTS/PURPOSE OF CONSULTATION:  SCC oropharynx  ASSESSMENT & PLAN:   #1 This is a very pleasant 67 year old healthy male patient with no significant past medical history newly diagnosed with squamous cell carcinoma both sides, locally advanced with ipsilateral and possibly contralateral lymphadenopathy as well presented to medical oncology for recommendations.  Given the invasion of skeletal muscle, he may be actually a T4 N2 M0 staging so far.   PET on 12/14 no evidence of metastatic disease.Large hypermetabolic right-sided oropharyngeal mass consistent with known squamous cell neoplasm. Associated extensive bilateral multistation hypermetabolic lymphadenopathy. We have discussed that for locally advanced HPV positive oropharyngeal cancers, primary treatment would be to recommend concurrent chemoradiation provided there is no evidence of metastatic disease. I have discussed about the regimen, schedule, adverse effects history significant for chest pain ototoxicity, toxicity, peripheral neuropathy.  He understands that some of the side effects can be life-threatening and permanent. Since his last visit, he was seen in the ED with hypotension and near syncope, thought to be vasovagal. He feels better today, no concerns. PE with improvement in tonsillar mass and LN bilaterally. NO mucositis noted  #2 cancer related pain, patient has been using hydrocodone once a day.  Refill was sent requested.  #3  Unintentional weight loss, lost 4 pounds of weight since last weight.  He has a follow up with Samule Ohm tomorrow. He is not  able to drink much, may be just one ensure a day.  # CINV, taking anti nausea medication once a day.  Ok to proceed with chemo tomorrow  Please do not hesitate to contact us with any additional questions or concerns  HISTORY OF PRESENTING ILLNESS:  Alec Gibson 67 y.o. 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  Chronology  Alec Gibson is a 67 year old male patient no significant medical history who first noticed pain in the right side of his jaw as well as his ER about 3 to 4 months ago.  He used to notice it when he suddenly moves his head towards the right side but did not think much of it.  About for the past month and a half he had excruciating episodes of pain.  He describes this pain as it was not similar job and moves to his ear followed by severe pain in the neck.   The pain is so intense that he has to moan at times.   He got some medication from Dr. Sabino Gasser hydrocodone which she has been taking as needed, he does not really need 1 of this again.  Other than this pain, he denies any new complaints.  He is able to swallow everything.  He had CT imaging on 10/30/2022 which showed 3.5 x 2.3 cm solid mass lesion centered in the right Tonsil with effacement of the edges Glossotonsillar sulcus and likely posteromedial extent along the posterior oropharyngeal wall consistent with provided history of oropharyngeal neoplasm.  There is also a 1.4 cm morphologically suspicious right level 1B lymph node and additional suspected pathologic lymphadenopathy at level 2 with short segment occlusion of right internal jugular vein in the mid neck suboptimally evaluated due to streak artifact from dental amalgam. PET scan with no metastatic disease.  C1D1 scheduled for 12/10/2022 Port and PEG tube scheduled for 12/13/2022 Since his last visit, he had a vasovagal syncope and port had to be delayed He is not able to eat, mostly because of dysguesia. He denies much pain in the throat. For ex: yesterday he drank one  ensure, one piece of swiss cheese and not much else. Pain is well controlled on hydrocodone once a day. Rest of the pertinent 10 point ROS reviewed and negative  MEDICAL HISTORY:  Past Medical History:  Diagnosis Date   Anxiety    Depression    Thyroid disease     SURGICAL HISTORY: No past surgical history on file.  SOCIAL HISTORY: Social History   Socioeconomic History   Marital status: Divorced    Spouse name: Not on file   Number of children: Not on file   Years of education: Not on file   Highest education level: Not on file  Occupational History   Not on file  Tobacco Use   Smoking status: Never   Smokeless tobacco: Never  Substance and Sexual Activity   Alcohol use: Yes   Drug use: Never   Sexual activity: Not on file  Other Topics Concern   Not on file  Social History Narrative   Not on file   Social Determinants of Health   Financial Resource Strain: Medium Risk (12/12/2022)   Overall Financial Resource Strain (CARDIA)    Difficulty of Paying Living Expenses: Somewhat hard  Food Insecurity: No Food Insecurity (12/12/2022)   Hunger Vital Sign    Worried About Running Out of Food in the Last Year: Never true    Ran Out of Food in the Last Year: Never true  Transportation Needs: No Transportation Needs (12/12/2022)   PRAPARE - Hydrologist (Medical): No    Lack of Transportation (Non-Medical): No  Physical Activity: Not on file  Stress: Not on file  Social Connections: Not on file  Intimate Partner Violence: Not on file    FAMILY HISTORY: No family history on file.  ALLERGIES:  is allergic to sulfa antibiotics.  MEDICATIONS:  Current Outpatient Medications  Medication Sig Dispense Refill   lidocaine (XYLOCAINE) 2 % solution Patient: Mix 1part 2% viscous lidocaine, 1part H20. Swish & swallow 62m of diluted mixture, 336m before meals and at bedtime, up to QID 200 mL 3   Cholecalciferol (VITAMIN D-3 PO) Take 1 capsule by  mouth in the morning.     clonazePAM (KLONOPIN) 1 MG tablet Take 1 mg by mouth 2 (two) times daily as needed for anxiety (sleep).     dexamethasone (DECADRON) 4 MG tablet Take 2 tablets daily x 3 days starting the day after cisplatin chemotherapy. Take with food. (Patient not taking: Reported on 12/13/2022) 30 tablet 1   HYDROcodone-acetaminophen (NORCO/VICODIN) 5-325 MG tablet Take 1 tablet by mouth every 6 (six) hours as needed for moderate pain. 30 tablet 0   levocetirizine (XYZAL) 5 MG tablet Take 5 mg by mouth in the morning.     levothyroxine (SYNTHROID) 175 MCG tablet Take 1 tablet (175 mcg total) by mouth daily before breakfast. 30 tablet 0   lidocaine-prilocaine (EMLA) cream Apply to affected area once (Patient not taking: Reported on 12/13/2022) 30 g 3   Multiple Vitamin (MULTIVITAMIN) tablet Take 1 tablet by mouth in the morning.     Multiple Vitamins-Minerals (ZINC PO) Take 1 tablet by mouth in the morning.     Omega-3 Fatty Acids (FISH OIL PO) Take  1 capsule by mouth in the morning.     ondansetron (ZOFRAN) 8 MG tablet Take 1 tablet (8 mg total) by mouth every 8 (eight) hours as needed for nausea or vomiting. Start on the third day after cisplatin. 30 tablet 1   prochlorperazine (COMPAZINE) 10 MG tablet Take 1 tablet (10 mg total) by mouth every 6 (six) hours as needed (Nausea or vomiting). (Patient not taking: Reported on 12/13/2022) 30 tablet 1   VITAMIN E PO Take 1 capsule by mouth in the morning.     No current facility-administered medications for this visit.     PHYSICAL EXAMINATION: ECOG PERFORMANCE STATUS: 1 - Symptomatic but completely ambulatory  Vitals:   12/17/22 0836  BP: 111/74  Pulse: 66  Resp: 16  Temp: 97.7 F (36.5 C)  SpO2: 99%     Filed Weights   12/17/22 0836  Weight: 148 lb 4.8 oz (67.3 kg)      GENERAL:alert, no distress and comfortable Mouth: Right tonsillar tumor noted Bilateral upper cervical lymphadenopathy palpable, improved compared to last  visit. Chest: CTA bilaterally. Abdomen: Soft, NT, ND No BLE edema  LABORATORY DATA:  I have reviewed the data as listed Lab Results  Component Value Date   WBC 11.9 (H) 12/16/2022   HGB 15.2 12/16/2022   HCT 43.4 12/16/2022   MCV 90.4 12/16/2022   PLT 202 12/16/2022     Chemistry      Component Value Date/Time   NA 133 (L) 12/16/2022 0955   K 3.8 12/16/2022 0955   CL 97 (L) 12/16/2022 0955   CO2 31 12/16/2022 0955   BUN 23 12/16/2022 0955   CREATININE 0.94 12/16/2022 0955      Component Value Date/Time   CALCIUM 9.9 12/16/2022 0955       RADIOGRAPHIC STUDIES: I have personally reviewed the radiological images as listed and agreed with the findings in the report. ECHOCARDIOGRAM COMPLETE  Result Date: 12/13/2022    ECHOCARDIOGRAM REPORT   Patient Name:   Alec Gibson Date of Exam: 12/13/2022 Medical Rec #:  [id number]   Height:       69.0 in Accession #:    [id number]  Weight:       152.2 lb Date of Birth:  December 26, 1955    BSA:          1.840 m Patient Age:    89 years    BP:           96/65 mmHg Patient Gender: M           HR:           67 bpm. Exam Location:  Inpatient Procedure: 2D Echo, Cardiac Doppler and Color Doppler Indications:   Syncope R55  History:       Patient has no prior history of Echocardiogram examinations.                Cancer.  Sonographer:   Ronny Flurry Referring      [place]: IMPRESSIONS  1. Left ventricular ejection fraction, by estimation, is 60 to 65%. The left ventricle has normal function. The left ventricle has no regional wall motion abnormalities. Left ventricular diastolic parameters are indeterminate.  2. Right ventricular systolic function is normal. The right ventricular size is normal.  3. No evidence of mitral valve regurgitation.  4. The aortic valve is tricuspid. Aortic valve regurgitation is not visualized.  5. The inferior vena cava is normal in size with greater than 50%  respiratory variability, suggesting right atrial pressure  of 3 mmHg. Comparison(s): No prior Echocardiogram. FINDINGS  Left Ventricle: Left ventricular ejection fraction, by estimation, is 60 to 65%. The left ventricle has normal function. The left ventricle has no regional wall motion abnormalities. The left ventricular internal cavity size was normal in size. There is  no left ventricular hypertrophy. Left ventricular diastolic parameters are indeterminate. Right Ventricle: The right ventricular size is normal. Right ventricular systolic function is normal. Left Atrium: Left atrial size was normal in size. Right Atrium: Right atrial size was normal in size. Pericardium: There is no evidence of pericardial effusion. Mitral Valve: No evidence of mitral valve regurgitation. Tricuspid Valve: Tricuspid valve regurgitation is not demonstrated. Aortic Valve: The aortic valve is tricuspid. Aortic valve regurgitation is not visualized. Aortic valve mean gradient measures 2.0 mmHg. Aortic valve peak gradient measures 3.8 mmHg. Aortic valve area, by VTI measures 3.09 cm. Pulmonic Valve: Pulmonic valve regurgitation is not visualized. Aorta: The aortic root and ascending aorta are structurally normal, with no evidence of dilitation. Venous: The inferior vena cava is normal in size with greater than 50% respiratory variability, suggesting right atrial pressure of 3 mmHg. IAS/Shunts: No atrial level shunt detected by color flow Doppler.  LEFT VENTRICLE PLAX 2D LVIDd:         4.00 cm   Diastology LVIDs:         3.00 cm   LV e' medial:    8.05 cm/s LV PW:         0.90 cm   LV E/e' medial:  10.4 LV IVS:        0.70 cm   LV e' lateral:   9.14 cm/s LVOT diam:     2.00 cm   LV E/e' lateral: 9.1 LV SV:         62 LV SV Index:   33 LVOT Area:     3.14 cm  RIGHT VENTRICLE             IVC RV S prime:     13.80 cm/s  IVC diam: 1.50 cm TAPSE (M-mode): 2.2 cm LEFT ATRIUM           Index        RIGHT ATRIUM           Index LA diam:      3.10 cm 1.68 cm/m   RA Area:     14.30 cm LA Vol (A2C):  15.3 ml 8.32 ml/m   RA Volume:   36.30 ml  19.73 ml/m LA Vol (A4C): 30.6 ml 16.63 ml/m  AORTIC VALVE AV Area (Vmax):    2.98 cm AV Area (Vmean):   2.87 cm AV Area (VTI):     3.09 cm AV Vmax:           97.70 cm/s AV Vmean:          63.400 cm/s AV VTI:            0.199 m AV Peak Grad:      3.8 mmHg AV Mean Grad:      2.0 mmHg LVOT Vmax:         92.63 cm/s LVOT Vmean:        57.867 cm/s LVOT VTI:          0.196 m LVOT/AV VTI ratio: 0.98  AORTA Ao Root diam: 3.40 cm Ao Asc diam:  3.40 cm MITRAL VALVE MV Area (PHT): 3.60 cm    SHUNTS MV Decel Time:  211 msec    Systemic VTI:  0.20 m MV E velocity: 83.40 cm/s  Systemic Diam: 2.00 cm MV A velocity: 80.00 cm/s MV E/A ratio:  1.04 Mary Scientist, physiological signed by Phineas Inches Signature Date/Time: 12/13/2022/3:09:21 PM    Final    NM PET Image Initial (PI) Skull Base To Thigh (F-18 FDG)  Result Date: 11/22/2022 CLINICAL DATA:  Initial treatment strategy for oropharyngeal squamous cell carcinoma. EXAM: NUCLEAR MEDICINE PET SKULL BASE TO THIGH TECHNIQUE: 8.57 mCi F-18 FDG was injected intravenously. Full-ring PET imaging was performed from the skull base to thigh after the radiotracer. CT data was obtained and used for attenuation correction and anatomic localization. Fasting blood glucose: 103 mg/dl COMPARISON:  Neck CT 10/30/2022 FINDINGS: Mediastinal blood pool activity: SUV max 1.99 Liver activity: SUV max NA NECK: Large right-sided oropharyngeal mass beginning in the right palatine tonsillar area and extending all the way down to the supraglottic area. This invades the right parapharyngeal space. SUV max is 11.87. Associated extensive multilevel bilateral neck adenopathy as demonstrated on the CT scan. The right level 1B node has an SUV max of 7.72. Left-sided level 2 adenopathy has an SUV max of 9.84. Small right supraclavicular node measures 7 mm with SUV max of 4.52. Incidental CT findings: None. CHEST: No hypermetabolic mediastinal or hilar nodes. No  suspicious pulmonary nodules on the CT scan. Incidental CT findings: Minimal scattered aortic calcifications. No definite coronary artery calcifications. Slightly prominent sternalis muscles are noted. ABDOMEN/PELVIS: No abnormal hypermetabolic activity within the liver, pancreas, adrenal glands, or spleen. No hypermetabolic lymph nodes in the abdomen or pelvis. Incidental CT findings: Scattered atherosclerotic calcifications involving the aorta but no aneurysm. SKELETON: No focal hypermetabolic activity to suggest skeletal metastasis. Incidental CT findings: None. IMPRESSION: 1. Large hypermetabolic right-sided oropharyngeal mass consistent with known squamous cell neoplasm. Associated extensive bilateral multistation hypermetabolic lymphadenopathy. 2. No findings for metastatic disease involving the chest, abdomen/pelvis or bony structures. Electronically Signed   By: Marijo Sanes M.D.   On: 11/22/2022 08:10    All questions were answered. The patient knows to call the clinic with any problems, questions or concerns. I spent 30 minutes in the care of this patient including H and P, review of records, counseling and coordination of care.     [name], MD 12/17/2022 8:44 AM

## 2022-12-17 ENCOUNTER — Ambulatory Visit
Admission: RE | Admit: 2022-12-17 | Discharge: 2022-12-17 | Disposition: A | Discharge status: Home / Self Care | Payer: Medicare Other | Source: Ambulatory Visit | Attending: Radiation Oncology | Admitting: Radiation Oncology

## 2022-12-17 ENCOUNTER — Other Ambulatory Visit: Payer: Self-pay

## 2022-12-17 ENCOUNTER — Inpatient Hospital Stay (HOSPITAL_BASED_OUTPATIENT_CLINIC_OR_DEPARTMENT_OTHER): Payer: Medicare Other | Admitting: Hematology and Oncology

## 2022-12-17 VITALS — BP 111/74 | HR 66 | Temp 97.7°F | Resp 16 | Ht 69.0 in | Wt 148.3 lb

## 2022-12-17 DIAGNOSIS — C109 Malignant neoplasm of oropharynx, unspecified: Secondary | ICD-10-CM | POA: Diagnosis not present

## 2022-12-17 DIAGNOSIS — Z510 Encounter for antineoplastic radiation therapy: Secondary | ICD-10-CM | POA: Diagnosis not present

## 2022-12-17 DIAGNOSIS — C090 Malignant neoplasm of tonsillar fossa: Secondary | ICD-10-CM | POA: Diagnosis not present

## 2022-12-17 DIAGNOSIS — Z5111 Encounter for antineoplastic chemotherapy: Secondary | ICD-10-CM | POA: Diagnosis not present

## 2022-12-17 LAB — RAD ONC ARIA SESSION SUMMARY
Course Elapsed Days: 7
Plan Fractions Treated to Date: 5
Plan Prescribed Dose Per Fraction: 2 Gy
Plan Total Fractions Prescribed: 35
Plan Total Prescribed Dose: 70 Gy
Reference Point Dosage Given to Date: 10 Gy
Reference Point Session Dosage Given: 2 Gy
Session Number: 5

## 2022-12-17 MED FILL — Dexamethasone Sodium Phosphate Inj 100 MG/10ML: INTRAMUSCULAR | Qty: 1 | Status: AC

## 2022-12-17 MED FILL — Fosaprepitant Dimeglumine For IV Infusion 150 MG (Base Eq): INTRAVENOUS | Qty: 5 | Status: AC

## 2022-12-18 ENCOUNTER — Encounter (HOSPITAL_COMMUNITY): Payer: Self-pay

## 2022-12-18 ENCOUNTER — Other Ambulatory Visit: Payer: Self-pay

## 2022-12-18 ENCOUNTER — Ambulatory Visit
Admission: RE | Admit: 2022-12-18 | Discharge: 2022-12-18 | Disposition: A | Discharge status: Home / Self Care | Payer: Medicare Other | Source: Ambulatory Visit | Attending: Radiation Oncology | Admitting: Radiation Oncology

## 2022-12-18 ENCOUNTER — Inpatient Hospital Stay: Payer: Medicare Other

## 2022-12-18 ENCOUNTER — Inpatient Hospital Stay: Payer: Medicare Other | Admitting: Dietician

## 2022-12-18 VITALS — BP 117/73 | HR 70 | Temp 97.9°F | Resp 17

## 2022-12-18 DIAGNOSIS — C109 Malignant neoplasm of oropharynx, unspecified: Secondary | ICD-10-CM

## 2022-12-18 DIAGNOSIS — Z5111 Encounter for antineoplastic chemotherapy: Secondary | ICD-10-CM | POA: Diagnosis not present

## 2022-12-18 DIAGNOSIS — Z510 Encounter for antineoplastic radiation therapy: Secondary | ICD-10-CM | POA: Diagnosis not present

## 2022-12-18 DIAGNOSIS — C090 Malignant neoplasm of tonsillar fossa: Secondary | ICD-10-CM | POA: Diagnosis not present

## 2022-12-18 LAB — RAD ONC ARIA SESSION SUMMARY
Course Elapsed Days: 8
Plan Fractions Treated to Date: 6
Plan Prescribed Dose Per Fraction: 2 Gy
Plan Total Fractions Prescribed: 35
Plan Total Prescribed Dose: 70 Gy
Reference Point Dosage Given to Date: 12 Gy
Reference Point Session Dosage Given: 2 Gy
Session Number: 6

## 2022-12-18 MED ORDER — SODIUM CHLORIDE 0.9 % IV SOLN
150.0000 mg | Freq: Once | INTRAVENOUS | Status: AC
  Administered 2022-12-18: 150 mg via INTRAVENOUS
  Filled 2022-12-18: qty 150

## 2022-12-18 MED ORDER — SODIUM CHLORIDE 0.9 % IV SOLN: Freq: Once | INTRAVENOUS | Status: AC

## 2022-12-18 MED ORDER — SODIUM CHLORIDE 0.9 % IV SOLN
40.0000 mg/m2 | Freq: Once | INTRAVENOUS | Status: AC
  Administered 2022-12-18: 74 mg via INTRAVENOUS
  Filled 2022-12-18: qty 74

## 2022-12-18 MED ORDER — MAGNESIUM SULFATE 2 GM/50ML IV SOLN
2.0000 g | Freq: Once | INTRAVENOUS | Status: AC
  Administered 2022-12-18: 2 g via INTRAVENOUS
  Filled 2022-12-18: qty 50

## 2022-12-18 MED ORDER — POTASSIUM CHLORIDE IN NACL 20-0.9 MEQ/L-% IV SOLN
Freq: Once | INTRAVENOUS | Status: AC
  Filled 2022-12-18: qty 1000

## 2022-12-18 MED ORDER — SODIUM CHLORIDE 0.9 % IV SOLN
10.0000 mg | Freq: Once | INTRAVENOUS | Status: AC
  Administered 2022-12-18: 10 mg via INTRAVENOUS
  Filled 2022-12-18: qty 10

## 2022-12-18 MED ORDER — PALONOSETRON HCL INJECTION 0.25 MG/5ML
0.2500 mg | Freq: Once | INTRAVENOUS | Status: AC
  Administered 2022-12-18: 0.25 mg via INTRAVENOUS
  Filled 2022-12-18: qty 5

## 2022-12-18 NOTE — Progress Notes (Signed)
OK for image guided gastrostomy.   Can defer the PO contrast on this occasion.   Alec Gibson

## 2022-12-18 NOTE — Patient Instructions (Signed)
[facility] ONCOLOGY  Discharge Instructions: Thank you for choosing [facility] to provide your oncology and hematology care.   If you have a lab appointment with the [facility], please go directly to the [facility] and check in at the registration area.   Wear comfortable clothing and clothing appropriate for easy access to any Portacath or PICC line.   We strive to give you quality time with your provider. You may need to reschedule your appointment if you arrive late (15 or more minutes).  Arriving late affects you and other patients whose appointments are after yours.  Also, if you miss three or more appointments without notifying the office, you may be dismissed from the clinic at the provider's discretion.      For prescription refill requests, have your pharmacy contact our office and allow 72 hours for refills to be completed.    Today you received the following chemotherapy and/or immunotherapy agents: Cisplatin      To help prevent nausea and vomiting after your treatment, we encourage you to take your nausea medication as directed.  BELOW ARE SYMPTOMS THAT SHOULD BE REPORTED IMMEDIATELY: *FEVER GREATER THAN 100.4 F (38 C) OR HIGHER *CHILLS OR SWEATING *NAUSEA AND VOMITING THAT IS NOT CONTROLLED WITH YOUR NAUSEA MEDICATION *UNUSUAL SHORTNESS OF BREATH *UNUSUAL BRUISING OR BLEEDING *URINARY PROBLEMS (pain or burning when urinating, or frequent urination) *BOWEL PROBLEMS (unusual diarrhea, constipation, pain near the anus) TENDERNESS IN MOUTH AND THROAT WITH OR WITHOUT PRESENCE OF ULCERS (sore throat, sores in mouth, or a toothache) UNUSUAL RASH, SWELLING OR PAIN  UNUSUAL VAGINAL DISCHARGE OR ITCHING   Items with * indicate a potential emergency and should be followed up as soon as possible or go to the Emergency Department if any problems should occur.  Please show the CHEMOTHERAPY ALERT CARD or IMMUNOTHERAPY ALERT CARD at check-in to  the Emergency Department and triage nurse.  Should you have questions after your visit or need to cancel or reschedule your appointment, please contact [facility]  Dept: [phone]  and follow the prompts.  Office hours are 8:00 a.m. to 4:30 p.m. Monday - Friday. Please note that voicemails left after 4:00 p.m. may not be returned until the following business day.  We are closed weekends and major holidays. You have access to a nurse at all times for urgent questions. Please call the main number to the clinic Dept: [phone] and follow the prompts.   For any non-urgent questions, you may also contact your provider using MyChart. We now offer e-Visits for anyone 53 and older to request care online for non-urgent symptoms. For details visit [email or web address].   Also download the MyChart app! Go to the app store, search "MyChart", open the app, select [facility], and log in with your MyChart username and password.

## 2022-12-18 NOTE — Progress Notes (Signed)
Nutrition Follow-up:  Patient with SCC of oropharynx. He is receiving concurrent chemoradiation under the care of Dr. Chryl Heck and Dr. Isidore Moos (start 1/3).   1/5-1/6 admission after syncopal episodes x 2 during prep for PEG/PAC placement   Met with patient during infusion. He reports diminished taste of most foods and decreased appetite. Recalls typical morning toast has no taste as well as hot cocoa and chocolate mint ice cream. He reports chocolate Ensure is becoming more difficult to drink due to taste. Patient reports eating the broth from chicken noodle soup the past couple of days as this does taste good. He can "choke down" a few of the noodles, but not eating the chicken from the soup. Patient reports dysphagia with crackers. He denies pain with swallowing. His mouth is dry. Patient his not doing baking soda salt water rinses. He forgot about this. Patient is planning to start this evening. He denies further nausea since hospital discharge. He is taking antiemetics as prescribed. Patient is having daily bowel movements. Patient reports he is willing to try having PEG/PAC placement again if he is not required to drink contrast. He feels he had a reaction to this.    Medications: reviewed  Labs: 1/8 - Na 133  Anthropometrics: Wt 148 lb 4.8 oz today decreased 2.6% (4 lbs) in one week - this is significant    Estimated Energy Needs  Kcals: [id number] Protein: 95-116 Fluid: >2.1 L  NUTRITION DIAGNOSIS: Unintentional weight loss continues    INTERVENTION:  Reinforced importance of adequate calorie and protein energy intake to maintain strength/weights Discussed strategies for adding protein to foods as well as ideas on foods to try (suggested adding bone broth + canned chicken to soup, chx pot pie, jar beef/chicken gravy mixed with canned chicken over rice, scrambled egg for breakfast) - pt appreciative of ideas  Educated on tips for altered, diminished taste. Suggested using baking soda salt  water rinse before meals - handout + recipe given Suggested pt try vanilla Ensure vs Chocolate as well as cutting with whole milk for desired consistency as needed - recommend 4-5 Ensure Plus/equivalent  Patient given case of Ensure on 1/3 Patient agreeable to trying vanilla ice cream during infusion today. Suggested mixing with vanilla ensure for high calorie shake - shake recipes given Monitor for PEG/PAC plans   MONITORING, EVALUATION, GOAL: weight trends, intake, tube feeding    NEXT VISIT: Wednesday January 17 during infusion

## 2022-12-18 NOTE — Progress Notes (Signed)
Okay to proceed with treatment with 173m urinary output per Dr. IChryl Heck Will add an additional 502mNS with the post hydration fluids.

## 2022-12-19 ENCOUNTER — Ambulatory Visit
Admission: RE | Admit: 2022-12-19 | Discharge: 2022-12-19 | Disposition: A | Discharge status: Home / Self Care | Payer: Medicare Other | Source: Ambulatory Visit | Attending: Radiation Oncology | Admitting: Radiation Oncology

## 2022-12-19 ENCOUNTER — Other Ambulatory Visit: Payer: Self-pay

## 2022-12-19 DIAGNOSIS — Z510 Encounter for antineoplastic radiation therapy: Secondary | ICD-10-CM | POA: Diagnosis not present

## 2022-12-19 DIAGNOSIS — C090 Malignant neoplasm of tonsillar fossa: Secondary | ICD-10-CM | POA: Diagnosis not present

## 2022-12-19 DIAGNOSIS — C109 Malignant neoplasm of oropharynx, unspecified: Secondary | ICD-10-CM | POA: Diagnosis not present

## 2022-12-19 LAB — RAD ONC ARIA SESSION SUMMARY
Course Elapsed Days: 9
Plan Fractions Treated to Date: 7
Plan Prescribed Dose Per Fraction: 2 Gy
Plan Total Fractions Prescribed: 35
Plan Total Prescribed Dose: 70 Gy
Reference Point Dosage Given to Date: 14 Gy
Reference Point Session Dosage Given: 2 Gy
Session Number: 7

## 2022-12-19 NOTE — Progress Notes (Addendum)
Pt wanted to let nursing staff and Dr. Isidore Moos know he is feeling better overall and was able to eat more throughout the week. He reports still taking nausea medication in the mornings but he has not had vomiting since the weekend. He also reports having a bowel movement as well. He was very happy about that. Pt states mouth rinses (which he just recently started are also improving his swallowing and eating as well.

## 2022-12-20 ENCOUNTER — Ambulatory Visit
Admission: RE | Admit: 2022-12-20 | Discharge: 2022-12-20 | Disposition: A | Discharge status: Home / Self Care | Payer: Medicare Other | Source: Ambulatory Visit | Attending: Radiation Oncology | Admitting: Radiation Oncology

## 2022-12-20 ENCOUNTER — Other Ambulatory Visit: Payer: Self-pay

## 2022-12-20 DIAGNOSIS — Z510 Encounter for antineoplastic radiation therapy: Secondary | ICD-10-CM | POA: Diagnosis not present

## 2022-12-20 DIAGNOSIS — C109 Malignant neoplasm of oropharynx, unspecified: Secondary | ICD-10-CM | POA: Diagnosis not present

## 2022-12-20 DIAGNOSIS — C090 Malignant neoplasm of tonsillar fossa: Secondary | ICD-10-CM | POA: Diagnosis not present

## 2022-12-20 LAB — RAD ONC ARIA SESSION SUMMARY
Course Elapsed Days: 10
Plan Fractions Treated to Date: 8
Plan Prescribed Dose Per Fraction: 2 Gy
Plan Total Fractions Prescribed: 35
Plan Total Prescribed Dose: 70 Gy
Reference Point Dosage Given to Date: 16 Gy
Reference Point Session Dosage Given: 2 Gy
Session Number: 8

## 2022-12-23 ENCOUNTER — Other Ambulatory Visit: Payer: Self-pay

## 2022-12-23 ENCOUNTER — Ambulatory Visit
Admission: RE | Admit: 2022-12-23 | Discharge: 2022-12-23 | Disposition: A | Discharge status: Home / Self Care | Payer: Medicare Other | Source: Ambulatory Visit | Attending: Radiation Oncology | Admitting: Radiation Oncology

## 2022-12-23 ENCOUNTER — Inpatient Hospital Stay: Payer: Medicare Other

## 2022-12-23 DIAGNOSIS — C090 Malignant neoplasm of tonsillar fossa: Secondary | ICD-10-CM | POA: Diagnosis not present

## 2022-12-23 DIAGNOSIS — C109 Malignant neoplasm of oropharynx, unspecified: Secondary | ICD-10-CM

## 2022-12-23 DIAGNOSIS — Z5111 Encounter for antineoplastic chemotherapy: Secondary | ICD-10-CM | POA: Diagnosis not present

## 2022-12-23 DIAGNOSIS — Z510 Encounter for antineoplastic radiation therapy: Secondary | ICD-10-CM | POA: Diagnosis not present

## 2022-12-23 LAB — BASIC METABOLIC PANEL - CANCER CENTER ONLY
Anion gap: 6 (ref 5–15)
BUN: 22 mg/dL (ref 8–23)
CO2: 32 mmol/L (ref 22–32)
Calcium: 9.7 mg/dL (ref 8.9–10.3)
Chloride: 96 mmol/L — ABNORMAL LOW (ref 98–111)
Creatinine: 0.87 mg/dL (ref 0.61–1.24)
GFR, Estimated: >60 mL/min (ref >60)
Glucose, Bld: 88 mg/dL (ref 70–99)
Potassium: 4 mmol/L (ref 3.5–5.1)
Sodium: 134 mmol/L — ABNORMAL LOW (ref 135–145)

## 2022-12-23 LAB — CBC WITH DIFFERENTIAL (CANCER CENTER ONLY)
Abs Immature Granulocytes: 0.04 10*3/uL (ref 0.00–0.07)
Basophils Absolute: 0 10*3/uL (ref 0.0–0.1)
Basophils Relative: 0 %
Eosinophils Absolute: 0.1 10*3/uL (ref 0.0–0.5)
Eosinophils Relative: 0 %
HCT: 40.6 % (ref 39.0–52.0)
Hemoglobin: 14 g/dL (ref 13.0–17.0)
Immature Granulocytes: 0 %
Lymphocytes Relative: 3 %
Lymphs Abs: 0.3 10*3/uL — ABNORMAL LOW (ref 0.7–4.0)
MCH: 31.7 pg (ref 26.0–34.0)
MCHC: 34.5 g/dL (ref 30.0–36.0)
MCV: 91.9 fL (ref 80.0–100.0)
Monocytes Absolute: 0.8 10*3/uL (ref 0.1–1.0)
Monocytes Relative: 7 %
Neutro Abs: 10.1 10*3/uL — ABNORMAL HIGH (ref 1.7–7.7)
Neutrophils Relative %: 90 %
Platelet Count: 229 10*3/uL (ref 150–400)
RBC: 4.42 MIL/uL (ref 4.22–5.81)
RDW: 13.5 % (ref 11.5–15.5)
WBC Count: 11.4 10*3/uL — ABNORMAL HIGH (ref 4.0–10.5)
nRBC: 0 % (ref 0.0–0.2)

## 2022-12-23 LAB — RAD ONC ARIA SESSION SUMMARY
Course Elapsed Days: 13
Plan Fractions Treated to Date: 9
Plan Prescribed Dose Per Fraction: 2 Gy
Plan Total Fractions Prescribed: 35
Plan Total Prescribed Dose: 70 Gy
Reference Point Dosage Given to Date: 18 Gy
Reference Point Session Dosage Given: 2 Gy
Session Number: 9

## 2022-12-23 LAB — MAGNESIUM: Magnesium: 2.1 mg/dL (ref 1.7–2.4)

## 2022-12-24 ENCOUNTER — Ambulatory Visit
Admission: RE | Admit: 2022-12-24 | Discharge: 2022-12-24 | Disposition: A | Discharge status: Home / Self Care | Payer: Medicare Other | Source: Ambulatory Visit | Attending: Radiation Oncology | Admitting: Radiation Oncology

## 2022-12-24 ENCOUNTER — Other Ambulatory Visit: Payer: Self-pay

## 2022-12-24 ENCOUNTER — Inpatient Hospital Stay (HOSPITAL_BASED_OUTPATIENT_CLINIC_OR_DEPARTMENT_OTHER): Payer: Medicare Other | Admitting: Hematology and Oncology

## 2022-12-24 ENCOUNTER — Encounter: Payer: Self-pay | Admitting: Hematology and Oncology

## 2022-12-24 VITALS — BP 106/73 | HR 70 | Temp 97.9°F | Resp 16 | Ht 69.0 in | Wt 143.2 lb

## 2022-12-24 DIAGNOSIS — K1231 Oral mucositis (ulcerative) due to antineoplastic therapy: Secondary | ICD-10-CM | POA: Diagnosis not present

## 2022-12-24 DIAGNOSIS — R634 Abnormal weight loss: Secondary | ICD-10-CM | POA: Diagnosis not present

## 2022-12-24 DIAGNOSIS — C090 Malignant neoplasm of tonsillar fossa: Secondary | ICD-10-CM | POA: Diagnosis not present

## 2022-12-24 DIAGNOSIS — Z510 Encounter for antineoplastic radiation therapy: Secondary | ICD-10-CM | POA: Diagnosis not present

## 2022-12-24 DIAGNOSIS — C109 Malignant neoplasm of oropharynx, unspecified: Secondary | ICD-10-CM | POA: Diagnosis not present

## 2022-12-24 DIAGNOSIS — Z5111 Encounter for antineoplastic chemotherapy: Secondary | ICD-10-CM | POA: Diagnosis not present

## 2022-12-24 LAB — RAD ONC ARIA SESSION SUMMARY
Course Elapsed Days: 14
Plan Fractions Treated to Date: 10
Plan Prescribed Dose Per Fraction: 2 Gy
Plan Total Fractions Prescribed: 35
Plan Total Prescribed Dose: 70 Gy
Reference Point Dosage Given to Date: 20 Gy
Reference Point Session Dosage Given: 2 Gy
Session Number: 10

## 2022-12-24 NOTE — Progress Notes (Signed)
[facility] CONSULT NOTE  Patient Care Team: [name], NP as PCP - General (Family Medicine) [name], [name], RN as Oncology Nurse Navigator [name], MD as Consulting Physician (Hematology and Oncology) [name], MD as Consulting Physician (Radiation Oncology) [name], MD as Consulting Physician (Otolaryngology)  CHIEF COMPLAINTS/PURPOSE OF CONSULTATION:  SCC oropharynx  ASSESSMENT & PLAN:   #1 This is a very pleasant 67 year old healthy male patient with no significant past medical history newly diagnosed with squamous cell carcinoma both sides, locally advanced with ipsilateral and possibly contralateral lymphadenopathy as well presented to medical oncology for recommendations.  Given the invasion of skeletal muscle, he may be actually a T4 N2 M0 staging so far.   PET on 12/14 no evidence of metastatic disease.Large hypermetabolic right-sided oropharyngeal mass consistent with known squamous cell neoplasm. Associated extensive bilateral multistation hypermetabolic lymphadenopathy. We have discussed that for locally advanced HPV positive oropharyngeal cancers, primary treatment would be to recommend concurrent chemoradiation provided there is no evidence of metastatic disease. I have discussed about the regimen, schedule, adverse effects history significant for chest pain ototoxicity, toxicity, peripheral neuropathy.  He understands that some of the side effects can be life-threatening and permanent. He has now completed 2 cycles of chemotherapy. He feels unwell today, rapidly losing weight, not eating anything. G tube scheduled for 1/22. We will plan to hold chemo this week since he is not feeling well at all.  #2 cancer related pain, patient has been using hydrocodone once a day.  He is also using baking soda rinses for mouth sores.  #3  Unintentional weight loss, lost 5 pounds of weight since last weight.  G tube scheduled 1/22. He is hopeful  to start feeding him self once he gets the tube.  # CINV, taking anti nausea medication as needed. He tried taking them before each meal to be able to eat, but he couldn't eat much.  Hold chemo tomorrow.   Please do not hesitate to contact us with any additional questions or concerns  HISTORY OF PRESENTING ILLNESS:  Alec Gibson 67 y.o. 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  Chronology  [name] is a 67 year old male patient no significant medical history who first noticed pain in the right side of his jaw as well as his ER about 3 to 4 months ago.  He used to notice it when he suddenly moves his head towards the right side but did not think much of it.  About for the past month and a half he had excruciating episodes of pain.  He describes this pain as it was not similar job and moves to his ear followed by severe pain in the neck.   The pain is so intense that he has to moan at times.   He got some medication from Dr. Sabino Gasser hydrocodone which she has been taking as needed, he does not really need 1 of this again.  Other than this pain, he denies any new complaints.  He is able to swallow everything.  He had CT imaging on 10/30/2022 which showed 3.5 x 2.3 cm solid mass lesion centered in the right Tonsil with effacement of the edges Glossotonsillar sulcus and likely posteromedial extent along the posterior oropharyngeal wall consistent with provided history of oropharyngeal neoplasm.  There is also a 1.4 cm morphologically suspicious right level 1B lymph node and additional suspected pathologic lymphadenopathy at level 2 with short segment occlusion of right internal jugular vein in the mid neck  suboptimally evaluated due to streak artifact from dental amalgam. PET scan with no metastatic disease.  C1D1 scheduled for 12/10/2022 Port and PEG tube scheduled for 12/13/2022  Interval History  Since last visit, he feels really unwell. Yesterday he went to work, he felt terrible and had to leave.  Even thought of food is making him nauseated. He is not able to eat much.  He tried to take nausea medication even before eating and this did not work. For whole day yesterday, he ate nothing. He has lost about 5 lbs in the past week. He drank 2 glasses of milk yesterday. G tube is scheduled for 1/22. H eas wondering if he can skip chemo this week since he isnt feeling well. He has noted some mouth sores, dry mouth. He has been using baking soda rinses.  Wt Readings from Last 3 Encounters:  12/24/22 143 lb 3.2 oz (65 kg)  12/17/22 148 lb 4.8 oz (67.3 kg)  12/13/22 152 lb 4 oz (69.1 kg)     Rest of the pertinent 10 point ROS reviewed and negative  MEDICAL HISTORY:  Past Medical History:  Diagnosis Date   Anxiety    Depression    Thyroid disease     SURGICAL HISTORY: No past surgical history on file.  SOCIAL HISTORY: Social History   Socioeconomic History   Marital status: Divorced    Spouse name: Not on file   Number of children: Not on file   Years of education: Not on file   Highest education level: Not on file  Occupational History   Not on file  Tobacco Use   Smoking status: Never   Smokeless tobacco: Never  Substance and Sexual Activity   Alcohol use: Yes   Drug use: Never   Sexual activity: Not on file  Other Topics Concern   Not on file  Social History Narrative   Not on file   Social Determinants of Health   Financial Resource Strain: Medium Risk (12/12/2022)   Overall Financial Resource Strain (CARDIA)    Difficulty of Paying Living Expenses: Somewhat hard  Food Insecurity: No Food Insecurity (12/12/2022)   Hunger Vital Sign    Worried About Running Out of Food in the Last Year: Never true    Ran Out of Food in the Last Year: Never true  Transportation Needs: No Transportation Needs (12/12/2022)   PRAPARE - Hydrologist (Medical): No    Lack of Transportation (Non-Medical): No  Physical Activity: Not on file  Stress: Not on  file  Social Connections: Not on file  Intimate Partner Violence: Not on file    FAMILY HISTORY: No family history on file.  ALLERGIES:  is allergic to sulfa antibiotics.  MEDICATIONS:  Current Outpatient Medications  Medication Sig Dispense Refill   lidocaine (XYLOCAINE) 2 % solution Patient: Mix 1part 2% viscous lidocaine, 1part H20. Swish & swallow 31m of diluted mixture, 351m before meals and at bedtime, up to QID 200 mL 3   Cholecalciferol (VITAMIN D-3 PO) Take 1 capsule by mouth in the morning.     clonazePAM (KLONOPIN) 1 MG tablet Take 1 mg by mouth 2 (two) times daily as needed for anxiety (sleep).     dexamethasone (DECADRON) 4 MG tablet Take 2 tablets daily x 3 days starting the day after cisplatin chemotherapy. Take with food. (Patient not taking: Reported on 12/13/2022) 30 tablet 1   HYDROcodone-acetaminophen (NORCO/VICODIN) 5-325 MG tablet Take 1 tablet by mouth every 6 (six)  hours as needed for moderate pain. 30 tablet 0   levocetirizine (XYZAL) 5 MG tablet Take 5 mg by mouth in the morning.     levothyroxine (SYNTHROID) 175 MCG tablet Take 1 tablet (175 mcg total) by mouth daily before breakfast. 30 tablet 0   lidocaine-prilocaine (EMLA) cream Apply to affected area once (Patient not taking: Reported on 12/13/2022) 30 g 3   Multiple Vitamin (MULTIVITAMIN) tablet Take 1 tablet by mouth in the morning.     Multiple Vitamins-Minerals (ZINC PO) Take 1 tablet by mouth in the morning.     Omega-3 Fatty Acids (FISH OIL PO) Take 1 capsule by mouth in the morning.     ondansetron (ZOFRAN) 8 MG tablet Take 1 tablet (8 mg total) by mouth every 8 (eight) hours as needed for nausea or vomiting. Start on the third day after cisplatin. 30 tablet 1   prochlorperazine (COMPAZINE) 10 MG tablet Take 1 tablet (10 mg total) by mouth every 6 (six) hours as needed (Nausea or vomiting). (Patient not taking: Reported on 12/13/2022) 30 tablet 1   VITAMIN E PO Take 1 capsule by mouth in the morning.      No current facility-administered medications for this visit.     PHYSICAL EXAMINATION: ECOG PERFORMANCE STATUS: 1 - Symptomatic but completely ambulatory  Vitals:   12/24/22 0825  BP: 106/73  Pulse: 70  Resp: 16  Temp: 97.9 F (36.6 C)  SpO2: 100%     Filed Weights   12/24/22 0825  Weight: 143 lb 3.2 oz (65 kg)      GENERAL:alert, no distress and comfortable Mouth: Right tonsillar tumor noted Bilateral upper cervical lymphadenopathy palpable, improved compared to last visit. Chest: CTA bilaterally. Abdomen: Soft, NT, ND No BLE edema  LABORATORY DATA:  I have reviewed the data as listed Lab Results  Component Value Date   WBC 11.4 (H) 12/23/2022   HGB 14.0 12/23/2022   HCT 40.6 12/23/2022   MCV 91.9 12/23/2022   PLT 229 12/23/2022     Chemistry      Component Value Date/Time   NA 134 (L) 12/23/2022 0850   K 4.0 12/23/2022 0850   CL 96 (L) 12/23/2022 0850   CO2 32 12/23/2022 0850   BUN 22 12/23/2022 0850   CREATININE 0.87 12/23/2022 0850      Component Value Date/Time   CALCIUM 9.7 12/23/2022 0850       RADIOGRAPHIC STUDIES: I have personally reviewed the radiological images as listed and agreed with the findings in the report. ECHOCARDIOGRAM COMPLETE  Result Date: 12/13/2022    ECHOCARDIOGRAM REPORT   Patient Name:   Alec Gibson Date of Exam: 12/13/2022 Medical Rec #:  [id number]   Height:       69.0 in Accession #:    [id number]  Weight:       152.2 lb Date of Birth:  04/17/1956    BSA:          1.840 m Patient Age:    11 years    BP:           96/65 mmHg Patient Gender: M           HR:           67 bpm. Exam Location:  Inpatient Procedure: 2D Echo, Cardiac Doppler and Color Doppler Indications:   Syncope R55  History:       Patient has no prior history of Echocardiogram examinations.  Cancer.  Sonographer:   Ronny Flurry Referring      [place]: IMPRESSIONS  1. Left ventricular ejection fraction, by estimation, is 60 to  65%. The left ventricle has normal function. The left ventricle has no regional wall motion abnormalities. Left ventricular diastolic parameters are indeterminate.  2. Right ventricular systolic function is normal. The right ventricular size is normal.  3. No evidence of mitral valve regurgitation.  4. The aortic valve is tricuspid. Aortic valve regurgitation is not visualized.  5. The inferior vena cava is normal in size with greater than 50% respiratory variability, suggesting right atrial pressure of 3 mmHg. Comparison(s): No prior Echocardiogram. FINDINGS  Left Ventricle: Left ventricular ejection fraction, by estimation, is 60 to 65%. The left ventricle has normal function. The left ventricle has no regional wall motion abnormalities. The left ventricular internal cavity size was normal in size. There is  no left ventricular hypertrophy. Left ventricular diastolic parameters are indeterminate. Right Ventricle: The right ventricular size is normal. Right ventricular systolic function is normal. Left Atrium: Left atrial size was normal in size. Right Atrium: Right atrial size was normal in size. Pericardium: There is no evidence of pericardial effusion. Mitral Valve: No evidence of mitral valve regurgitation. Tricuspid Valve: Tricuspid valve regurgitation is not demonstrated. Aortic Valve: The aortic valve is tricuspid. Aortic valve regurgitation is not visualized. Aortic valve mean gradient measures 2.0 mmHg. Aortic valve peak gradient measures 3.8 mmHg. Aortic valve area, by VTI measures 3.09 cm. Pulmonic Valve: Pulmonic valve regurgitation is not visualized. Aorta: The aortic root and ascending aorta are structurally normal, with no evidence of dilitation. Venous: The inferior vena cava is normal in size with greater than 50% respiratory variability, suggesting right atrial pressure of 3 mmHg. IAS/Shunts: No atrial level shunt detected by color flow Doppler.  LEFT VENTRICLE PLAX 2D LVIDd:         4.00 cm    Diastology LVIDs:         3.00 cm   LV e' medial:    8.05 cm/s LV PW:         0.90 cm   LV E/e' medial:  10.4 LV IVS:        0.70 cm   LV e' lateral:   9.14 cm/s LVOT diam:     2.00 cm   LV E/e' lateral: 9.1 LV SV:         62 LV SV Index:   33 LVOT Area:     3.14 cm  RIGHT VENTRICLE             IVC RV S prime:     13.80 cm/s  IVC diam: 1.50 cm TAPSE (M-mode): 2.2 cm LEFT ATRIUM           Index        RIGHT ATRIUM           Index LA diam:      3.10 cm 1.68 cm/m   RA Area:     14.30 cm LA Vol (A2C): 15.3 ml 8.32 ml/m   RA Volume:   36.30 ml  19.73 ml/m LA Vol (A4C): 30.6 ml 16.63 ml/m  AORTIC VALVE AV Area (Vmax):    2.98 cm AV Area (Vmean):   2.87 cm AV Area (VTI):     3.09 cm AV Vmax:           97.70 cm/s AV Vmean:          63.400 cm/s AV  VTI:            0.199 m AV Peak Grad:      3.8 mmHg AV Mean Grad:      2.0 mmHg LVOT Vmax:         92.63 cm/s LVOT Vmean:        57.867 cm/s LVOT VTI:          0.196 m LVOT/AV VTI ratio: 0.98  AORTA Ao Root diam: 3.40 cm Ao Asc diam:  3.40 cm MITRAL VALVE MV Area (PHT): 3.60 cm    SHUNTS MV Decel Time: 211 msec    Systemic VTI:  0.20 m MV E velocity: 83.40 cm/s  Systemic Diam: 2.00 cm MV A velocity: 80.00 cm/s MV E/A ratio:  1.04 Mary Scientist, physiological signed by Phineas Inches Signature Date/Time: 12/13/2022/3:09:21 PM    Final     All questions were answered. The patient knows to call the clinic with any problems, questions or concerns. I spent 30 minutes in the care of this patient including H and P, review of records, counseling and coordination of care.     [name], MD 12/24/2022 8:32 AM

## 2022-12-25 ENCOUNTER — Other Ambulatory Visit: Payer: Self-pay

## 2022-12-25 ENCOUNTER — Inpatient Hospital Stay: Payer: Medicare Other | Admitting: Dietician

## 2022-12-25 ENCOUNTER — Ambulatory Visit
Admission: RE | Admit: 2022-12-25 | Discharge: 2022-12-25 | Disposition: A | Discharge status: Home / Self Care | Payer: Medicare Other | Source: Ambulatory Visit | Attending: Radiation Oncology | Admitting: Radiation Oncology

## 2022-12-25 ENCOUNTER — Other Ambulatory Visit: Payer: Self-pay | Admitting: Hematology and Oncology

## 2022-12-25 ENCOUNTER — Inpatient Hospital Stay: Payer: Medicare Other

## 2022-12-25 DIAGNOSIS — Z510 Encounter for antineoplastic radiation therapy: Secondary | ICD-10-CM | POA: Diagnosis not present

## 2022-12-25 DIAGNOSIS — C109 Malignant neoplasm of oropharynx, unspecified: Secondary | ICD-10-CM

## 2022-12-25 DIAGNOSIS — Z5111 Encounter for antineoplastic chemotherapy: Secondary | ICD-10-CM | POA: Diagnosis not present

## 2022-12-25 DIAGNOSIS — C090 Malignant neoplasm of tonsillar fossa: Secondary | ICD-10-CM | POA: Diagnosis not present

## 2022-12-25 LAB — RAD ONC ARIA SESSION SUMMARY
Course Elapsed Days: 15
Plan Fractions Treated to Date: 11
Plan Prescribed Dose Per Fraction: 2 Gy
Plan Total Fractions Prescribed: 35
Plan Total Prescribed Dose: 70 Gy
Reference Point Dosage Given to Date: 22 Gy
Reference Point Session Dosage Given: 2 Gy
Session Number: 11

## 2022-12-25 MED ORDER — SODIUM CHLORIDE 0.9 % IV SOLN: INTRAVENOUS | Status: DC

## 2022-12-25 NOTE — Progress Notes (Signed)
Nutrition Follow-up:  Patient with SCC of oropharynx. He is receiving concurrent chemoradiation under the care of Dr. Chryl Heck and Dr. Isidore Moos (start 1/3). Chemo held 1/17   1/5-1/6 admission after syncopal episodes x 2 during prep for PEG/PAC placement - PEG/PAC rescheduled for 1/22   Met with patient in infusion. Treatment held today. He is receiving IV fluids. Patient reports poor intake. Food either taste like nothing or it taste bad. He says water is the only thing that tastes normal. Patient reports stomaching chicken broth and lots of water. He states his body rejects foods as he begins to swallow. Pt becomes tearful. His mouth is dry and sore. He has thick saliva. Patient has been unable to drink Ensure or anything milky the last few days. This makes him nauseas as well adds to thickness of salvia. He is using baking soda salt water rinses multiple times daily. This is working well.    Medications: reviewed   Labs: Na 134  Anthropometrics: Weights continue to decrease. Pt 143 lb 3.2 oz on 1/16 down 3.4% in one week - severe  1/9 - 148 lb 4.8 oz  1/2 - 152 lb 9.6 oz   Estimated Energy Needs  Kcals: 2180-2545 Protein: 95-116 Fluid: >2.1 L  NUTRITION DIAGNOSIS: Unintended weight loss continues - PEG planned 1/22    INTERVENTION:  Suggested trying baking soda/salt rinse just before intake  Encouraged small sips/bites q2h of soft smooth foods  Provided samples of Boost Breeze (250 kcal, 9 g) + unflavored Unjury protein packs (10.5 g protein) for pt to try. Suggested adding this to water High calorie non-dairy shake ideas given  PEG planned for 1/22 - given poor intake + wt loss, pt is at high risk for refeeding. Recommend adding Mg/Phos/K to 1/23 labs and continuing to monitor every 3 days until stable  Support and encouragement    MONITORING, EVALUATION, GOAL: weight trends, intake, PEG placement, labs   NEXT VISIT: Tuesday January 23 after MD

## 2022-12-25 NOTE — Therapy (Signed)
OUTPATIENT PHYSICAL THERAPY HEAD AND NECK BASELINE EVALUATION   Patient Name: Alec Gibson MRN: [id number] DOB:1956-01-22, 67 y.o., male Today's Date: 12/26/2022  END OF SESSION:  PT End of Session - 12/26/22 0814     Visit Number 1    Number of Visits 2    Date for PT Re-Evaluation 02/20/23    PT Start Time 0810    PT Stop Time 0845    PT Time Calculation (min) 35 min    Activity Tolerance Patient tolerated treatment well    Behavior During Therapy [facility] for tasks assessed/performed             Past Medical History:  Diagnosis Date   Anxiety    Depression    Thyroid disease    History reviewed. No pertinent surgical history. 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    PERTINENT HISTORY:  SCC of the Right Tonsil, stage III (T4N2M0, p 16 +), presented to his PCP on 10/23/22 with stabbing right ear pain for one month and a swollen lymph node to the right side of his neck present for several months. He also reported that the ear pain radiated to beneath his jaw. He was urgently referred to ENT. 10/23/22  (same day as PCP visit) He saw Dr. Sabino Gasser who performed a flexible laryngoscopy which revealed at firm tumor based on the right palatine tonsil with extension through the soft palate into the right eustachian tube. 10/30/22 CT neck was completed revealing a solid mass lesion centered in the right palatine tonsil measuring 3.5 x 2.3 cm, with effacement of the adjacent parapharyngeal fat, right glossotonsillar sulcus, and likely posteromedial extent along the posterior oropharyngeal wall. CT also showed a morphologically suspicious right level IB lymph node measuring 1.4 cm, and additional suspected level II pathologic lymphadenopathy with short-segment occlusion of the right internal jugular vein in the mid neck. 11/07/22 Biopsy of the right oropharyngeal mass showed high-grade invasive SCC, p 16 +. He will receive 35 fractions of radiation to his oropharynx and bilateral neck with weekly cisplatin. He started on 12/10/22 and will complete 01/27/23. PEG/PAC planned for 1/22 (placement was attempted on 1/5 but he developed low BP and HR.  PATIENT GOALS:   to be educated about the signs and symptoms of lymphedema and learn post op HEP.   PAIN:  Are you having pain? Yes: NPRS scale: 8/10 Pain location: throat Pain description: sore Aggravating factors: no Relieving factors: cold water, ice  PRECAUTIONS: Active CA orthostatic hypotension possibly  WEIGHT BEARING RESTRICTIONS: No  FALLS:  Has patient fallen in last 6 months? Yes. Number of falls 1 - today - passed out in the  bathroom and fell in the bathroom Does the patient have a fear of falling that limits activity? No Is the patient reluctant to leave the house due to a fear of falling?No  LIVING ENVIRONMENT: Patient lives with: alone with dog Lives in: House/apartment Has following equipment at home: None  OCCUPATION: full time job at an [employer], Medical illustrator, just stopped a week ago   LEISURE: pt reports he is active, does maintenance  on house, works out in the yard all the time  [place] FUNCTION: Independent   OBJECTIVE:  COGNITION: Overall cognitive status: Within functional limits for tasks assessed                  POSTURE:  Forward head and rounded shoulders posture  30 SEC SIT TO STAND: Not performed today due to possible orthostatic hypotension and pt passed out this morning and fell in his bathroom - he may receive IV fluids today, his doctor will assess after this appt  SHOULDER AROM:   WFL   CERVICAL AROM:   Percent limited  Flexion WFL  Extension WFL  Right lateral flexion WFL  Left lateral flexion WFL  Right rotation WFL  Left rotation WFL    (Blank rows=not tested)  GAIT: Assessed: Yes Assistance needed: Independent Ambulation Distance: 10 feet Assistive Device: none Gait pattern: WFL Ambulation surface: Level  PATIENT EDUCATION:  Education details: Neck ROM, importance of posture when sitting, standing and lying down, deep breathing, walking program and importance of staying active throughout treatment, CURE article on staying active, "Why exercise?" flyer, lymphedema and PT info Person educated: Patient Education method: Explanation, Demonstration, Handout Education comprehension: Patient verbalized understanding and returned demonstration  HOME EXERCISE PROGRAM: Patient was instructed today in a home exercise program today for head and neck range of motion exercises. These included active cervical flexion, active cervical extension, active cervical rotation to each direction, upper trap stretch, and shoulder retraction. Patient was encouraged to do these 2-3 times a day, holding for 5 sec each and completing for 5 reps. Pt was educated that once this becomes easier then hold the stretches for 30-60 seconds.    ASSESSMENT:  CLINICAL IMPRESSION: Pt arrives to PT with recently diagnosed R tonsillar cancer. He will receive 35 fractions of radiation to his oropharynx and  bilateral neck with weekly cisplatin. He started on 12/10/22 and will complete 01/27/23. At baseline line is cervical ROM is [facility]. He has had some recent episodes of passing out which could be due to poor nutrition or orthostatic hypotension. His plan is to have a feeding tube placed Monday of next week. He passed out when they were going to place it prior to his treatments. Educated pt about signs and symptoms of lymphedema as well as anatomy and physiology of lymphatic system. Educated pt in importance of staying as active as possible throughout treatment to decrease fatigue as well as head and neck ROM exercises to decrease loss of ROM. Will see pt after completion of radiation to reassess ROM and assess for lymphedema and to determine therapy needs at that time.  Pt will benefit from skilled therapeutic intervention to improve on the following deficits: Decreased knowledge of precautions and postural dysfunction.   PT treatment/interventions: ADL/self-care home management, pt/family education, therapeutic exercise.   REHAB POTENTIAL: Good  CLINICAL DECISION MAKING: Stable/uncomplicated  EVALUATION COMPLEXITY: Low    GOALS: Goals reviewed with patient? YES  LONG TERM GOALS: (STG=LTG)   Name Target Date  Goal status  1 Patient will be able to verbalize understanding of a home exercise program for cervical range of motion, posture, and walking.   Baseline:  No knowledge 12/25/2022 Achieved at eval  2 Patient will be able to verbalize understanding of proper sitting and standing posture. Baseline:  No knowledge 12/25/2022 Achieved at eval  3 Patient will be able to verbalize understanding of lymphedema risk and availability of treatment for this condition Baseline:  No knowledge 12/25/2022 Achieved at eval  4 Pt will demonstrate a return to full cervical ROM and function post operatively compared to baselines and not demonstrate any signs or symptoms of lymphedema.  Baseline: See objective  measurements taken today. 02/20/23 New    PLAN:  PT FREQUENCY/DURATION: EVAL and 1 follow up appointment.   PLAN FOR NEXT SESSION: will reassess 2 weeks after completion of radiation to determine needs.  Patient will follow up at outpatient cancer rehab 2 weeks after completion of radiation.  If the patient requires physical therapy at that time, a specific plan will be dictated and sent to the referring physician for approval. The patient was educated today on appropriate basic range of motion exercises to begin now and continue throughout radiation and educated on the signs and symptoms of lymphedema. Patient verbalized good understanding.     Physical Therapy Information for During and After Head/Neck Cancer Treatment: Lymphedema is a swelling condition that you may be at risk for in your neck and/or face if you have radiation treatment to the area and/or if you have surgery that includes removing lymph nodes.  There is treatment available for this condition and it is not life-threatening.  Contact your physician or physical therapist with concerns. An excellent resource for those seeking information on lymphedema is the National Lymphedema Network's website.  It can be accessed at [place].org If you notice swelling in your neck or face at any time following surgery (even if it is many years from now), please contact your doctor or physical therapist to discuss this.  Lymphedema can be treated at any time but it is easier for you if it is treated early on. If you have had surgery to your neck, please check with your surgeon about how soon to start doing neck range of motion exercises.  If you are not having surgery, I encourage you to start doing neck range of motion exercises today and continue these while undergoing treatment, UNLESS you have irritation of your skin or soft tissue that is aggravated by doing them.  These exercises are intended to help you prevent loss of range of motion  and/or to gain range of motion in your neck (which can be limited by tightening effects of radiation), and NOT to aggravate these tissues if they develop sensitivities from treatment. Neck range of motion exercises should be done to the point of feeling a GENTLE, TOLERABLE stretch only.  You are encouraged to start a walking or other exercise program tomorrow and continue this as much as you are able through and after treatment.  Please feel free to call me with any questions. Manus Gunning, PT, CLT Physical Therapist and Certified Lymphedema Therapist [facility] [address], [address], [place], [place] [address] [phone] [email or web address]  WALKING  Walking is a great form of exercise to increase your strength, endurance and overall fitness.  A walking program can help you start slowly and gradually build endurance as you go.  Everyone's ability is different, so each person's starting point will be different.  You do not have to follow them exactly.  The are just samples. You should simply find out what's right for you and stick to that program.   In the beginning, you'll start off walking 2-3 times a day for short distances.  As you get stronger, you'll be walking further at just 1-2 times per day.  A. You Can Walk For A Certain Length Of Time Each Day    Walk 5 minutes 3 times per day.  Increase 2 minutes every 2 days (3 times per day).  Work up to 25-30 minutes (1-2 times per day).   Example:   Day 1-2 5 minutes 3 times per day   Day 7-8 12 minutes 2-3 times per day   Day 13-14 25 minutes 1-2 times per day  B. You Can Walk For a Certain Distance Each Day     Distance can be substituted for time.    Example:   3 trips to mailbox (at road)   3 trips to corner of block   3 trips around the block  C. Go to local high school and use the track.    Walk for distance ____ around track  Or time ____ minutes  D. Walk ____ Jog  ____ Run ___   Why exercise?  So many benefits! Here are SOME of them: Heart health, including raising your good cholesterol level and reducing heart rate and blood pressure Lung health, including improved lung capacity It burns fats, and most of [place] can stand to be leaner, whether or not we are overweight. It increases the body's natural painkillers and mood elevators, so makes you feel better. Not only makes you feel better, but look better too Improves sleep Takes a bite out of stress May decrease your risk of many types of cancer If you are currently undergoing cancer treatment, exercise may improve your ability to tolerate treatments including chemotherapy. For everybody, it can improve your energy level. Those with cancer-related fatigue report a 40-50% reduction in this symptom when exercising regularly. If you are a survivor of breast, colon, or prostate cancer, it may decrease your risk of a recurrence. (This may hold for other cancers too, but so far we have data just for these three types.)  How to exercise: Get your doctor's okay. Pick something you enjoy doing, like walking, Zumba, biking, swimming, or whatever. Start at low intensity and time, then gradually increase.  (See walking program handout.) Set a goal to achieve over time.  The American Cancer Society, American Heart Association, and U.S. Dept. of Health and Human Services recommend 150 minutes of moderate exercise, 75 minutes of vigorous exercise, or a combination of both per week. This should be done in episodes at least 10 minutes long, spread throughout the week.  Need help being motivated? Pick something you enjoy doing, because you'll be more inclined to stick with that activity than something that feels like a chore. Do it with a friend so that you are accountable to each other. Schedule it into your day. Place it on your calendar and keep that appointment just like you do any appointment that you make. Join  an exercise group that meets at a specific time.  That way, you have to show up on time, and that makes it harder to procrastinate about doing your workout.  It also keeps you accountable--people begin to expect you to be there. Join a gym where you feel comfortable and not intimidated, at the right cost. Sign up  for something that you'll need to be in shape for on a specific date, like a 1K or a 5K to walk or run, a 20 or 30 mile bike ride, a mud run or something like that. If the date is looming, you know you'll need to train to be ready for it.  An added benefit is that many of these are fundraisers for good causes. If you've already paid for a gym membership, group exercise class or event, you might as well work out, so you haven't wasted your money!    [facility] [employer], PT 12/26/2022, 8:49 AM

## 2022-12-25 NOTE — Patient Instructions (Signed)
Dehydration, Adult Dehydration is a condition in which there is not enough water or other fluids in the body. This happens when a person loses more fluids than he or she takes in. Important organs, such as the kidneys, brain, and heart, cannot function without a proper amount of fluids. Any loss of fluids from the body can lead to dehydration. Dehydration can be mild, moderate, or severe. It should be treated right away to prevent it from becoming severe. What are the causes? Dehydration may be caused by: Conditions that cause loss of water or other fluids, such as diarrhea, vomiting, or sweating or urinating a lot. Not drinking enough fluids, especially when you are ill or doing activities that require a lot of energy. Other illnesses and conditions, such as fever or infection. Certain medicines, such as medicines that remove excess fluid from the body (diuretics). Lack of safe drinking water. Not being able to get enough water and food. What increases the risk? The following factors may make you more likely to develop this condition: Having a long-term (chronic) illness that has not been treated properly, such as diabetes, heart disease, or kidney disease. Being 65 years of age or older. Having a disability. Living in a place that is high in altitude, where thinner, drier air causes more fluid loss. Doing exercises that put stress on your body for a long time (endurance sports). What are the signs or symptoms? Symptoms of dehydration depend on how severe it is. Mild or moderate dehydration Thirst. Dry lips or dry mouth. Dizziness or light-headedness, especially when standing up from a seated position. Muscle cramps. Dark urine. Urine may be the color of tea. Less urine or tears produced than usual. Headache. Severe dehydration Changes in skin. Your skin may be cold and clammy, blotchy, or pale. Your skin also may not return to normal after being lightly pinched and released. Little or  no tears, urine, or sweat. Changes in vital signs, such as rapid breathing and low blood pressure. Your pulse may be weak or may be faster than 100 beats a minute when you are sitting still. Other changes, such as: Feeling very thirsty. Sunken eyes. Cold hands and feet. Confusion. Being very tired (lethargic) or having trouble waking from sleep. Short-term weight loss. Loss of consciousness. How is this diagnosed? This condition is diagnosed based on your symptoms and a physical exam. You may have blood and urine tests to help confirm the diagnosis. How is this treated? Treatment for this condition depends on how severe it is. Treatment should be started right away. Do not wait until dehydration becomes severe. Severe dehydration is an emergency and needs to be treated in a hospital. Mild or moderate dehydration can be treated at home. You may be asked to: Drink more fluids. Drink an oral rehydration solution (ORS). This drink helps restore proper amounts of fluids and salts and minerals in the blood (electrolytes). Severe dehydration can be treated: With IV fluids. By correcting abnormal levels of electrolytes. This is often done by giving electrolytes through a tube that is passed through your nose and into your stomach (nasogastric tube, or NG tube). By treating the underlying cause of dehydration. Follow these instructions at home: Oral rehydration solution If told by your health care provider, drink an ORS: Make an ORS by following instructions on the package. Start by drinking small amounts, about  cup (120 mL) every 5-10 minutes. Slowly increase how much you drink until you have taken the amount recommended by your health   care provider. Eating and drinking        Drink enough clear fluid to keep your urine pale yellow. If you were told to drink an ORS, finish the ORS first and then start slowly drinking other clear fluids. Drink fluids such as: Water. Do not drink only  water. Doing that can lead to hyponatremia, which is having too little salt (sodium) in the body. Water from ice chips you suck on. Fruit juice that you have added water to (diluted fruit juice). Low-calorie sports drinks. Eat foods that contain a healthy balance of electrolytes, such as bananas, oranges, potatoes, tomatoes, and spinach. Do not drink alcohol. Avoid the following: Drinks that contain a lot of sugar. These include high-calorie sports drinks, fruit juice that is not diluted, and soda. Caffeine. Foods that are greasy or contain a lot of fat or sugar. General instructions Take over-the-counter and prescription medicines only as told by your health care provider. Do not take sodium tablets. Doing that can lead to having too much sodium in the body (hypernatremia). Return to your normal activities as told by your health care provider. Ask your health care provider what activities are safe for you. Keep all follow-up visits as told by your health care provider. This is important. Contact a health care provider if: You have muscle cramps, pain, or discomfort, such as: Pain in your abdomen and the pain gets worse or stays in one area (localizes). Stiff neck. You have a rash. You are more irritable than usual. You are sleepier or have a harder time waking than usual. You feel weak or dizzy. You feel very thirsty. Get help right away if you have: Any symptoms of severe dehydration. Symptoms of vomiting, such as: You cannot eat or drink without vomiting. Vomiting gets worse or does not go away. Vomit includes blood or green matter (bile). Symptoms that get worse with treatment. A fever. A severe headache. Problems with urination or bowel movements, such as: Diarrhea that gets worse or does not go away. Blood in your stool (feces). This may cause stool to look black and tarry. Not urinating, or urinating only a small amount of very dark urine, within 6-8 hours. Trouble  breathing. These symptoms may represent a serious problem that is an emergency. Do not wait to see if the symptoms will go away. Get medical help right away. Call your local emergency services (911 in the [place] Do not drive yourself to the hospital. Summary Dehydration is a condition in which there is not enough water or other fluids in the body. This happens when a person loses more fluids than he or she takes in. Treatment for this condition depends on how severe it is. Treatment should be started right away. Do not wait until dehydration becomes severe. Drink enough clear fluid to keep your urine pale yellow. If you were told to drink an oral rehydration solution (ORS), finish the ORS first and then start slowly drinking other clear fluids. Take over-the-counter and prescription medicines only as told by your health care provider. Get help right away if you have any symptoms of severe dehydration. This information is not intended to replace advice given to you by your health care provider. Make sure you discuss any questions you have with your health care provider. Document Revised: 04/03/2022 Document Reviewed: 07/08/2019 Elsevier Patient Education  2023 Elsevier Inc.  

## 2022-12-26 ENCOUNTER — Encounter: Payer: Self-pay | Admitting: Physical Therapy

## 2022-12-26 ENCOUNTER — Other Ambulatory Visit: Payer: Self-pay

## 2022-12-26 ENCOUNTER — Inpatient Hospital Stay: Payer: Medicare Other

## 2022-12-26 ENCOUNTER — Ambulatory Visit: Payer: Medicare Other | Attending: Radiation Oncology | Admitting: Physical Therapy

## 2022-12-26 ENCOUNTER — Ambulatory Visit
Admission: RE | Admit: 2022-12-26 | Discharge: 2022-12-26 | Disposition: A | Discharge status: Home / Self Care | Payer: Medicare Other | Source: Ambulatory Visit | Attending: Radiation Oncology | Admitting: Radiation Oncology

## 2022-12-26 DIAGNOSIS — R293 Abnormal posture: Secondary | ICD-10-CM

## 2022-12-26 DIAGNOSIS — C090 Malignant neoplasm of tonsillar fossa: Secondary | ICD-10-CM | POA: Diagnosis not present

## 2022-12-26 DIAGNOSIS — C109 Malignant neoplasm of oropharynx, unspecified: Secondary | ICD-10-CM | POA: Diagnosis not present

## 2022-12-26 DIAGNOSIS — I951 Orthostatic hypotension: Secondary | ICD-10-CM

## 2022-12-26 DIAGNOSIS — Z5111 Encounter for antineoplastic chemotherapy: Secondary | ICD-10-CM | POA: Diagnosis not present

## 2022-12-26 DIAGNOSIS — Z510 Encounter for antineoplastic radiation therapy: Secondary | ICD-10-CM | POA: Diagnosis not present

## 2022-12-26 LAB — RAD ONC ARIA SESSION SUMMARY
Course Elapsed Days: 16
Plan Fractions Treated to Date: 12
Plan Prescribed Dose Per Fraction: 2 Gy
Plan Total Fractions Prescribed: 35
Plan Total Prescribed Dose: 70 Gy
Reference Point Dosage Given to Date: 24 Gy
Reference Point Session Dosage Given: 2 Gy
Session Number: 12

## 2022-12-26 MED ORDER — SODIUM CHLORIDE 0.9 % IV SOLN: Freq: Once | INTRAVENOUS | Status: AC

## 2022-12-26 MED ORDER — SODIUM CHLORIDE 0.9 % IV SOLN: Freq: Once | INTRAVENOUS | Status: DC

## 2022-12-26 NOTE — Progress Notes (Signed)
Pt presented this am prior to radiation treatment to tell RN he had a questionable syncopal episode at home after walking a short distance in his house. He reports he feels better now and was able to drive her with no issue. Rn took VS and pt was orthostatic. Pt reports that he got 13cc of fluid yesterday and Dr. Chryl Heck stated he could not do chemo yesterday due to his weight. Pt continues to report poor po intake. Pt to get feeding tube on Monday.   Vitals:   12/26/22 0750  BP: 126/74  Pulse: 77  Resp: 18  Temp: (!) 96.8 F (36 C)  SpO2: 100%   BP bp dropped to 99/75 with standing. No s/s of weakness or dizziness with orthostatic VS. Pt refused to go to the ED for evaluation. Rn will reach out to Dr. Chryl Heck to see if more fluids can be given today. Pt Stable throughout his visit to clinic this am.

## 2022-12-26 NOTE — Patient Instructions (Signed)

## 2022-12-27 ENCOUNTER — Inpatient Hospital Stay: Payer: Medicare Other

## 2022-12-27 ENCOUNTER — Encounter: Payer: Self-pay | Admitting: Hematology and Oncology

## 2022-12-27 ENCOUNTER — Other Ambulatory Visit (HOSPITAL_COMMUNITY): Payer: Self-pay | Admitting: Physician Assistant

## 2022-12-27 ENCOUNTER — Other Ambulatory Visit: Payer: Self-pay

## 2022-12-27 ENCOUNTER — Ambulatory Visit
Admission: RE | Admit: 2022-12-27 | Discharge: 2022-12-27 | Disposition: A | Discharge status: Home / Self Care | Payer: Medicare Other | Source: Ambulatory Visit | Attending: Radiation Oncology | Admitting: Radiation Oncology

## 2022-12-27 DIAGNOSIS — Z510 Encounter for antineoplastic radiation therapy: Secondary | ICD-10-CM | POA: Diagnosis not present

## 2022-12-27 DIAGNOSIS — C090 Malignant neoplasm of tonsillar fossa: Secondary | ICD-10-CM | POA: Diagnosis not present

## 2022-12-27 DIAGNOSIS — Z5111 Encounter for antineoplastic chemotherapy: Secondary | ICD-10-CM | POA: Diagnosis not present

## 2022-12-27 DIAGNOSIS — C109 Malignant neoplasm of oropharynx, unspecified: Secondary | ICD-10-CM | POA: Diagnosis not present

## 2022-12-27 DIAGNOSIS — I951 Orthostatic hypotension: Secondary | ICD-10-CM

## 2022-12-27 LAB — RAD ONC ARIA SESSION SUMMARY
Course Elapsed Days: 17
Plan Fractions Treated to Date: 13
Plan Prescribed Dose Per Fraction: 2 Gy
Plan Total Fractions Prescribed: 35
Plan Total Prescribed Dose: 70 Gy
Reference Point Dosage Given to Date: 26 Gy
Reference Point Session Dosage Given: 2 Gy
Session Number: 13

## 2022-12-27 MED ORDER — SODIUM CHLORIDE 0.9 % IV SOLN: INTRAVENOUS | Status: DC

## 2022-12-27 NOTE — Patient Instructions (Signed)

## 2022-12-27 NOTE — H&P (Signed)
Chief Complaint: Oropharyngeal cancer  Referring Physician(s): [name]  Supervising Physician: Michaelle Birks  Patient Status: Alec Gibson  History of Present Illness: Alec Gibson is a 67 y.o. male who first noticed pain in the right side of his jaw as well as his ear about 3 to 4 months ago.    CT imaging on 10/30/2022 showed = 3.5 x 2.3 cm solid mass lesion centered in the right Tonsil with effacement of the edges Glossotonsillar sulcus and likely posteromedial extent along the posterior oropharyngeal wall consistent with provided history of oropharyngeal neoplasm.    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 Associated extensive bilateral multistation hypermetabolic lymphadenopathy.  He is here today for image guided placement of a tunneled catheter with port and a gastrostomy tube.  He is NPO. He has lost some weight due to inability to swallow. No nausea/vomiting. No Fever/chills. ROS negative.   Past Medical History:  Diagnosis Date   Anxiety    Depression    Thyroid disease     History reviewed. No pertinent surgical history.  Allergies: Sulfa antibiotics  Medications: Prior to Admission medications   Medication Sig Start Date End Date Taking? Authorizing Provider  lidocaine (XYLOCAINE) 2 % solution Patient: Mix 1part 2% viscous lidocaine, 1part H20. Swish & swallow 72m of diluted mixture, 340m before meals and at bedtime, up to QID 12/16/22   SqEppie GibsonMD  Cholecalciferol (VITAMIN D-3 PO) Take 1 capsule by mouth in the morning.    [provider]  clonazePAM (KLONOPIN) 1 MG tablet Take 1 mg by mouth 2 (two) times daily as needed for anxiety (sleep). 06/28/22   [provider]  dexamethasone (DECADRON) 4 MG tablet Take 2 tablets daily x 3 days starting the day after cisplatin chemotherapy. Take with food. Patient not taking: Reported on 12/13/2022  11/23/22   IrBenay PikeMD  HYDROcodone-acetaminophen (NORCO/VICODIN) 5-325 MG tablet Take 1 tablet by mouth every 6 (six) hours as needed for moderate pain. 12/10/22   IrBenay PikeMD  levocetirizine (XYZAL) 5 MG tablet Take 5 mg by mouth in the morning. 10/21/22   [provider]  levothyroxine (SYNTHROID) 175 MCG tablet Take 1 tablet (175 mcg total) by mouth daily before breakfast. 12/14/22   Mapp, TaClaudia DesanctisMD  lidocaine-prilocaine (EMLA) cream Apply to affected area once Patient not taking: Reported on 12/13/2022 11/23/22   IrBenay PikeMD  Multiple Vitamin (MULTIVITAMIN) tablet Take 1 tablet by mouth in the morning.    [provider]  Multiple Vitamins-Minerals (ZINC PO) Take 1 tablet by mouth in the morning.    [provider]  Omega-3 Fatty Acids (FISH OIL PO) Take 1 capsule by mouth in the morning.    [provider]  ondansetron (ZOFRAN) 8 MG tablet Take 1 tablet (8 mg total) by mouth every 8 (eight) hours as needed for nausea or vomiting. Start on the third day after cisplatin. 11/23/22   IrBenay PikeMD  prochlorperazine (COMPAZINE) 10 MG tablet Take 1 tablet (10 mg total) by mouth every 6 (six) hours as needed (Nausea or vomiting). Patient not taking: Reported on 12/13/2022 11/23/22   IrBenay PikeMD  VITAMIN E PO Take 1 capsule by mouth in the morning.    [provider]     History reviewed. No pertinent family history.  Social History   Socioeconomic History   Marital status: Divorced    Spouse name: Not on file  Number of children: Not on file   Years of education: Not on file   Highest education level: Not on file  Occupational History   Not on file  Tobacco Use   Smoking status: Never   Smokeless tobacco: Never  Substance and Sexual Activity   Alcohol use: Yes   Drug use: Never   Sexual activity: Not on file  Other Topics Concern   Not on file  Social History Narrative   Not on file   Social  Determinants of Health   Financial Resource Strain: Medium Risk (12/12/2022)   Overall Financial Resource Strain (CARDIA)    Difficulty of Paying Living Expenses: Somewhat hard  Food Insecurity: No Food Insecurity (12/12/2022)   Hunger Vital Sign    Worried About Running Out of Food in the Last Year: Never true    Ran Out of Food in the Last Year: Never true  Transportation Needs: No Transportation Needs (12/12/2022)   PRAPARE - Hydrologist (Medical): No    Lack of Transportation (Non-Medical): No  Physical Activity: Not on file  Stress: Not on file  Social Connections: Not on file     Review of Systems: A 12 point ROS discussed and pertinent positives are indicated in the HPI above.  All other systems are negative.  Review of Systems  Vital Signs: BP 126/80   Pulse 62   Temp 97.9 F (36.6 C) (Oral)   Resp 15   Ht '5\' 9"'$  (1.753 m)   Wt 141 lb 1.5 oz (64 kg)   SpO2 96%   BMI 20.84 kg/m   Physical Exam Vitals reviewed.  Constitutional:      Appearance: Normal appearance.  HENT:     Head: Normocephalic and atraumatic.  Eyes:     Extraocular Movements: Extraocular movements intact.  Cardiovascular:     Rate and Rhythm: Normal rate and regular rhythm.  Pulmonary:     Effort: Pulmonary effort is normal. No respiratory distress.     Breath sounds: Normal breath sounds.  Abdominal:     General: There is no distension.     Palpations: Abdomen is soft.     Tenderness: There is no abdominal tenderness.  Musculoskeletal:        General: Normal range of motion.     Cervical back: Normal range of motion.  Skin:    General: Skin is warm and dry.  Neurological:     General: No focal deficit present.     Mental Status: He is alert and oriented to person, place, and time.  Psychiatric:        Mood and Affect: Mood normal.        Behavior: Behavior normal.        Thought Content: Thought content normal.        Judgment: Judgment normal.      Imaging: ECHOCARDIOGRAM COMPLETE  Result Date: 12/13/2022    ECHOCARDIOGRAM REPORT   Patient Name:   Alec Gibson Date of Exam: 12/13/2022 Medical Rec #:  [id number]   Height:       69.0 in Accession #:    [id number]  Weight:       152.2 lb Date of Birth:  [birth year]    BSA:          1.840 m Patient Age:    49 years    BP:           96/65 mmHg Patient Gender: M  HR:           67 bpm. Exam Location:  Inpatient Procedure: 2D Echo, Cardiac Doppler and Color Doppler Indications:   Syncope R55  History:       Patient has no prior history of Echocardiogram examinations.                Cancer.  Sonographer:   Ronny Flurry Referring      Sims: IMPRESSIONS  1. Left ventricular ejection fraction, by estimation, is 60 to 65%. The left ventricle has normal function. The left ventricle has no regional wall motion abnormalities. Left ventricular diastolic parameters are indeterminate.  2. Right ventricular systolic function is normal. The right ventricular size is normal.  3. No evidence of mitral valve regurgitation.  4. The aortic valve is tricuspid. Aortic valve regurgitation is not visualized.  5. The inferior vena cava is normal in size with greater than 50% respiratory variability, suggesting right atrial pressure of 3 mmHg. Comparison(s): No prior Echocardiogram. FINDINGS  Left Ventricle: Left ventricular ejection fraction, by estimation, is 60 to 65%. The left ventricle has normal function. The left ventricle has no regional wall motion abnormalities. The left ventricular internal cavity size was normal in size. There is  no left ventricular hypertrophy. Left ventricular diastolic parameters are indeterminate. Right Ventricle: The right ventricular size is normal. Right ventricular systolic function is normal. Left Atrium: Left atrial size was normal in size. Right Atrium: Right atrial size was normal in size. Pericardium: There is no evidence of pericardial effusion. Mitral Valve: No  evidence of mitral valve regurgitation. Tricuspid Valve: Tricuspid valve regurgitation is not demonstrated. Aortic Valve: The aortic valve is tricuspid. Aortic valve regurgitation is not visualized. Aortic valve mean gradient measures 2.0 mmHg. Aortic valve peak gradient measures 3.8 mmHg. Aortic valve area, by VTI measures 3.09 cm. Pulmonic Valve: Pulmonic valve regurgitation is not visualized. Aorta: The aortic root and ascending aorta are structurally normal, with no evidence of dilitation. Venous: The inferior vena cava is normal in size with greater than 50% respiratory variability, suggesting right atrial pressure of 3 mmHg. IAS/Shunts: No atrial level shunt detected by color flow Doppler.  LEFT VENTRICLE PLAX 2D LVIDd:         4.00 cm   Diastology LVIDs:         3.00 cm   LV e' medial:    8.05 cm/s LV PW:         0.90 cm   LV E/e' medial:  10.4 LV IVS:        0.70 cm   LV e' lateral:   9.14 cm/s LVOT diam:     2.00 cm   LV E/e' lateral: 9.1 LV SV:         62 LV SV Index:   33 LVOT Area:     3.14 cm  RIGHT VENTRICLE             IVC RV S prime:     13.80 cm/s  IVC diam: 1.50 cm TAPSE (M-mode): 2.2 cm LEFT ATRIUM           Index        RIGHT ATRIUM           Index LA diam:      3.10 cm 1.68 cm/m   RA Area:     14.30 cm LA Vol (A2C): 15.3 ml 8.32 ml/m   RA Volume:   36.30 ml  19.73 ml/m LA Vol (  A4C): 30.6 ml 16.63 ml/m  AORTIC VALVE AV Area (Vmax):    2.98 cm AV Area (Vmean):   2.87 cm AV Area (VTI):     3.09 cm AV Vmax:           97.70 cm/s AV Vmean:          63.400 cm/s AV VTI:            0.199 m AV Peak Grad:      3.8 mmHg AV Mean Grad:      2.0 mmHg LVOT Vmax:         92.63 cm/s LVOT Vmean:        57.867 cm/s LVOT VTI:          0.196 m LVOT/AV VTI ratio: 0.98  AORTA Ao Root diam: 3.40 cm Ao Asc diam:  3.40 cm MITRAL VALVE MV Area (PHT): 3.60 cm    SHUNTS MV Decel Time: 211 msec    Systemic VTI:  0.20 m MV E velocity: 83.40 cm/s  Systemic Diam: 2.00 cm MV A velocity: 80.00 cm/s MV E/A ratio:  1.04  Landscape architect signed by Phineas Inches Signature Date/Time: 12/13/2022/3:09:21 PM    Final     Labs:  CBC: Recent Labs    12/13/22 2235 12/14/22 0230 12/16/22 0955 12/23/22 0850  WBC 12.1* 11.8* 11.9* 11.4*  HGB 13.3 13.2 15.2 14.0  HCT 39.4 39.1 43.4 40.6  PLT 199 182 202 229    COAGS: Recent Labs    12/13/22 0752  INR 1.0    BMP: Recent Labs    12/13/22 1044 12/13/22 2235 12/14/22 0230 12/16/22 0955 12/23/22 0850  NA 134*  --  131* 133* 134*  K 4.3  --  3.5 3.8 4.0  CL 103  --  102 97* 96*  CO2 23  --  23 31 32  GLUCOSE 99  --  101* 91 88  BUN 21  --  '13 23 22  '$ CALCIUM 8.3*  --  8.6* 9.9 9.7  CREATININE 0.83 0.80 0.69 0.94 0.87  GFRNONAA >60 >60 >60 >60 >60    LIVER FUNCTION TESTS: No results for input(s): "BILITOT", "AST", "ALT", "ALKPHOS", "PROT", "ALBUMIN" in the last 8760 hours.  TUMOR MARKERS: No results for input(s): "AFPTM", "CEA", "CA199", "CHROMGRNA" in the last 8760 hours.  Assessment and Plan:  Oropharyngeal cancer with dysphagia.  Will proceed with image guided placement of a tunneled catheter with port and a gastrostomy tube today by Dr. Maryelizabeth Kaufmann.  Risks and benefits of image guided port-a-catheter placement was discussed with the patient including, but not limited to bleeding, infection, pneumothorax, or fibrin sheath development and need for additional procedures.  Risks and benefits image guided gastrostomy tube placement was discussed with the patient including, but not limited to the need for a barium enema during the procedure, bleeding, infection, peritonitis and/or damage to adjacent structures.  All of the patient's questions were answered, patient is agreeable to proceed.  Consent signed and in chart.  Thank you for allowing our service to participate in Alec Gibson 's care.  Electronically Signed: Murrell Redden, PA-C   12/30/2022, 11:32 AM      I spent a total of  30 Minutes  in face to face in clinical  consultation, greater than 50% of which was counseling/coordinating care for Alec Gibson and Alec Gibson.

## 2022-12-28 ENCOUNTER — Inpatient Hospital Stay: Payer: Medicare Other

## 2022-12-28 DIAGNOSIS — I951 Orthostatic hypotension: Secondary | ICD-10-CM

## 2022-12-28 DIAGNOSIS — Z5111 Encounter for antineoplastic chemotherapy: Secondary | ICD-10-CM | POA: Diagnosis not present

## 2022-12-28 MED ORDER — SODIUM CHLORIDE 0.9 % IV SOLN: Freq: Once | INTRAVENOUS | Status: AC

## 2022-12-28 NOTE — Patient Instructions (Signed)

## 2022-12-30 ENCOUNTER — Telehealth: Payer: Self-pay

## 2022-12-30 ENCOUNTER — Encounter (HOSPITAL_COMMUNITY): Payer: Self-pay

## 2022-12-30 ENCOUNTER — Ambulatory Visit (HOSPITAL_COMMUNITY)
Admission: RE | Admit: 2022-12-30 | Discharge: 2022-12-30 | Disposition: A | Discharge status: Home / Self Care | Payer: Medicare Other | Source: Ambulatory Visit | Attending: Hematology and Oncology | Admitting: Hematology and Oncology

## 2022-12-30 ENCOUNTER — Other Ambulatory Visit: Payer: Self-pay | Admitting: Interventional Radiology

## 2022-12-30 ENCOUNTER — Ambulatory Visit (HOSPITAL_COMMUNITY)
Admission: RE | Admit: 2022-12-30 | Discharge: 2022-12-30 | Disposition: A | Discharge status: Home / Self Care | Payer: Medicare Other | Source: Ambulatory Visit

## 2022-12-30 ENCOUNTER — Other Ambulatory Visit: Payer: Self-pay

## 2022-12-30 ENCOUNTER — Ambulatory Visit
Admission: RE | Admit: 2022-12-30 | Discharge: 2022-12-30 | Disposition: A | Discharge status: Home / Self Care | Payer: Medicare Other | Source: Ambulatory Visit | Attending: Radiation Oncology | Admitting: Radiation Oncology

## 2022-12-30 ENCOUNTER — Other Ambulatory Visit: Payer: Self-pay | Admitting: *Deleted

## 2022-12-30 ENCOUNTER — Other Ambulatory Visit: Payer: Self-pay | Admitting: Hematology and Oncology

## 2022-12-30 DIAGNOSIS — C109 Malignant neoplasm of oropharynx, unspecified: Secondary | ICD-10-CM

## 2022-12-30 DIAGNOSIS — Z452 Encounter for adjustment and management of vascular access device: Secondary | ICD-10-CM | POA: Diagnosis not present

## 2022-12-30 DIAGNOSIS — C090 Malignant neoplasm of tonsillar fossa: Secondary | ICD-10-CM | POA: Diagnosis not present

## 2022-12-30 DIAGNOSIS — Z510 Encounter for antineoplastic radiation therapy: Secondary | ICD-10-CM | POA: Diagnosis not present

## 2022-12-30 DIAGNOSIS — C760 Malignant neoplasm of head, face and neck: Secondary | ICD-10-CM | POA: Diagnosis not present

## 2022-12-30 DIAGNOSIS — R1310 Dysphagia, unspecified: Secondary | ICD-10-CM | POA: Insufficient documentation

## 2022-12-30 HISTORY — PX: IR GASTROSTOMY TUBE MOD SED: IMG625

## 2022-12-30 HISTORY — PX: IR NASO G TUBE PLC W/FL W/RAD: IMG2321

## 2022-12-30 HISTORY — PX: IR IMAGING GUIDED PORT INSERTION: IMG5740

## 2022-12-30 LAB — RAD ONC ARIA SESSION SUMMARY
Course Elapsed Days: 20
Plan Fractions Treated to Date: 14
Plan Prescribed Dose Per Fraction: 2 Gy
Plan Total Fractions Prescribed: 35
Plan Total Prescribed Dose: 70 Gy
Reference Point Dosage Given to Date: 28 Gy
Reference Point Session Dosage Given: 2 Gy
Session Number: 14

## 2022-12-30 MED ORDER — GLUCAGON HCL RDNA (DIAGNOSTIC) 1 MG IJ SOLR
INTRAMUSCULAR | Status: AC
  Filled 2022-12-30: qty 1

## 2022-12-30 MED ORDER — GLUCAGON HCL (RDNA) 1 MG IJ SOLR
INTRAMUSCULAR | Status: AC | PRN
  Administered 2022-12-30: .5 mg via INTRAVENOUS

## 2022-12-30 MED ORDER — FENTANYL CITRATE (PF) 100 MCG/2ML IJ SOLN
INTRAMUSCULAR | Status: AC | PRN
  Administered 2022-12-30 (×2): 50 ug via INTRAVENOUS

## 2022-12-30 MED ORDER — LIDOCAINE VISCOUS HCL 2 % MT SOLN
OROMUCOSAL | Status: AC
  Administered 2022-12-30: 4 mL via ORAL
  Filled 2022-12-30: qty 15

## 2022-12-30 MED ORDER — LIDOCAINE-EPINEPHRINE 1 %-1:100000 IJ SOLN
INTRAMUSCULAR | Status: AC | PRN
  Administered 2022-12-30: 20 mL

## 2022-12-30 MED ORDER — CEFAZOLIN SODIUM-DEXTROSE 2-4 GM/100ML-% IV SOLN
INTRAVENOUS | Status: AC
  Administered 2022-12-30: 2 g via INTRAVENOUS
  Filled 2022-12-30: qty 100

## 2022-12-30 MED ORDER — MIDAZOLAM HCL 2 MG/2ML IJ SOLN
INTRAMUSCULAR | Status: AC
  Filled 2022-12-30: qty 4

## 2022-12-30 MED ORDER — MIDAZOLAM HCL 2 MG/2ML IJ SOLN
INTRAMUSCULAR | Status: AC | PRN
  Administered 2022-12-30 (×3): 1 mg via INTRAVENOUS

## 2022-12-30 MED ORDER — HYDROCODONE-ACETAMINOPHEN 5-325 MG PO TABS
2.0000 | ORAL_TABLET | ORAL | Status: DC | PRN
  Administered 2022-12-30: 2 via ORAL
  Filled 2022-12-30: qty 2

## 2022-12-30 MED ORDER — ONDANSETRON HCL 4 MG/2ML IJ SOLN: 4.0000 mg | INTRAMUSCULAR | Status: DC | PRN

## 2022-12-30 MED ORDER — LIDOCAINE HCL 1 % IJ SOLN
INTRAMUSCULAR | Status: AC
  Administered 2022-12-30: 10 mL via INTRADERMAL
  Filled 2022-12-30: qty 20

## 2022-12-30 MED ORDER — CEFAZOLIN SODIUM-DEXTROSE 2-4 GM/100ML-% IV SOLN: 2.0000 g | Freq: Once | INTRAVENOUS | Status: AC

## 2022-12-30 MED ORDER — IOHEXOL 300 MG/ML  SOLN
50.0000 mL | Freq: Once | INTRAMUSCULAR | Status: AC | PRN
  Administered 2022-12-30: 30 mL

## 2022-12-30 MED ORDER — SODIUM CHLORIDE 0.9 % IV SOLN: INTRAVENOUS | Status: DC

## 2022-12-30 MED ORDER — HEPARIN SOD (PORK) LOCK FLUSH 100 UNIT/ML IV SOLN
INTRAVENOUS | Status: AC
  Administered 2022-12-30: 5 [IU] via INTRAVENOUS
  Filled 2022-12-30: qty 5

## 2022-12-30 MED ORDER — LIDOCAINE-EPINEPHRINE 1 %-1:100000 IJ SOLN
INTRAMUSCULAR | Status: AC
  Filled 2022-12-30: qty 1

## 2022-12-30 MED ORDER — FENTANYL CITRATE (PF) 100 MCG/2ML IJ SOLN
INTRAMUSCULAR | Status: AC
  Filled 2022-12-30: qty 2

## 2022-12-30 NOTE — Discharge Instructions (Addendum)
Please call Interventional Radiology clinic [phone] with any questions or concerns.  You may remove your dressing and shower tomorrow.  Moderate Conscious Sedation, Adult, Care After This sheet gives you information about how to care for yourself after your procedure. Your health care provider may also give you more specific instructions. If you have problems or questions, contact your health careprovider. What can I expect after the procedure? After the procedure, it is common to have: Sleepiness for several hours. Impaired judgment for several hours. Difficulty with balance. Vomiting if you eat too soon. Follow these instructions at home: For the time period you were told by your health care provider: Rest. Do not participate in activities where you could fall or become injured. Do not drive or use machinery. Do not drink alcohol. Do not take sleeping pills or medicines that cause drowsiness. Do not make important decisions or sign legal documents. Do not take care of children on your own. Eating and drinking  Follow the diet recommended by your health care provider. Drink enough fluid to keep your urine pale yellow. If you vomit: Drink water, juice, or soup when you can drink without vomiting. Make sure you have little or no nausea before eating solid foods.  General instructions Take over-the-counter and prescription medicines only as told by your health care provider. Have a responsible adult stay with you for the time you are told. It is important to have someone help care for you until you are awake and alert. Do not smoke. Keep all follow-up visits as told by your health care provider. This is important. Contact a health care provider if: You are still sleepy or having trouble with balance after 24 hours. You feel light-headed. You keep feeling nauseous or you keep vomiting. You develop a rash. You have a fever. You have redness or swelling around the IV  site. Get help right away if: You have trouble breathing. You have new-onset confusion at home. Summary After the procedure, it is common to feel sleepy, have impaired judgment, or feel nauseous if you eat too soon. Rest after you get home. Know the things you should not do after the procedure. Follow the diet recommended by your health care provider and drink enough fluid to keep your urine pale yellow. Get help right away if you have trouble breathing or new-onset confusion at home. This information is not intended to replace advice given to you by your health care provider. Make sure you discuss any questions you have with your healthcare provider. Document Revised: 03/24/2020 Document Reviewed: 10/21/2019 Elsevier Patient Education  2022 [place]. Gastrostomy Tube Home Guide, Adult A gastrostomy tube, or G-tube, is a tube that is inserted through the abdomen into the stomach. The tube is used to give feedings and medicines when a personcannot eat and drink enough on his or her own or take medicines by mouth. How to care for the insertion site: Supplies needed: Saline solution or clean, warm water and soap. Saline solution is made of salt and water. Cotton swab or gauze. Pre-cut gauze bandage (dressing) and tape, if needed. Instructions Follow these steps daily to clean the insertion site: Wash your hands with soap and water for at least 20 seconds. Remove the dressing (if there is one) that is between the person's skin and the tube. Check the area where the tube enters the skin. Check daily for problems such as: Redness, rash, or irritation. Swelling. Pus-like drainage. Extra skin growth. Moisten the cotton swab or gauze with  the saline solution or with a soap-and-water mixture. Gently clean around the insertion site. Remove any drainage or crusted material. When the G-tube is first put in, a normal saline solution or water can be used to clean the skin. After the skin around  the tube has healed, mild soap and water may be used. Apply a dressing (if there should be one) between the person's skin and the tube. How to flush a G-tube Flush the G-tube regularly to keep it from clogging. Flush it before and afterfeedings and as often as told by the health care provider. Supplies needed: Purified or germ-free (sterile) water, warmed. Container with lid for boiling water, if needed. 60 cc G-tube syringe. Instructions Before you begin, decide whether to use sterile water or purified drinking water. Use only sterile water if: The person has a weak disease-fighting (immune) system. The person has trouble fighting off infections (is immunocompromised). You are unsure about the amount of chemical contaminants in purified or drinking water. Use purified drinking water in all other cases. To purify drinking water by boiling: Boil water for at least 1 minute. Keep lid over water while it boils. Let water cool to room temperature before using. Follow these steps to flush the G-tube: Wash your hands with soap and water for at least 20 seconds. Bring out (draw up) 30 mL of warm water in a syringe. Connect the syringe to the tube. Slowly and gently push the water into the tube. General tips If the tube comes out: Cover the opening with a clean dressing and tape. Get help right away. If there is skin or scar tissue growing where the tube enters the skin: Keep the area clean and dry. Secure the tube with tape so that the tube does not move around too much. If the tube gets clogged: Slowly push warm water into the tube with a large syringe. Do not force the fluid into the tube or push an object into the tube. Get help right away if you cannot unclog the tube. Follow these instructions at home: Feedings Give feedings at room temperature. If feedings are continuous: Do not put more than 4 hours' worth of feedings in the feeding bag. Stop the feedings when you need to give  medicine or flush the tube. Be sure to restart the feedings. Make sure the person's head is above his or her stomach (upright position). This will prevent choking and discomfort. Make sure the person is in the right position during and after feedings. During feedings, have the person in the upright position. After a non-continuous feeding (bolus feeding), have the person stay in the upright position for 1 hour. Cover and place unused feedings in the refrigerator. Replace feeding bags and syringes as told. Good hygiene Make sure the person takes good care of his or her mouth and teeth (oral hygiene), such as by brushing his or her teeth. Keep the area where the tube enters the skin clean and dry. General instructions Do not pull or put tension on the tube. Before you remove the tube cap or disconnect a syringe, close the tube by using a clamp (clamping) or bending (kinking) the tube. Measure the length of the G-tube every day from the insertion site to the end of the tube. If the person's G-tube has a balloon, check the fluid in the balloon every week. Check the manufacturer's specifications to find the amount of fluid that should be in the balloon. Remove excess air from the G-tube as told. This  is called venting. Do not push feedings, medicines, or flushes fast. Use the feeding tube equipment, such as syringes and connectors, only as told by your health care provider. Contact a health care provider if: The person with the tube has constipation or a fever. A large amount of fluid or mucus-like liquid is leaking from the tube. Skin or scar tissue appears to be growing where the tube enters the skin. The length of tube from the insertion site to the G-tube gets longer. Get help right away if: The person with the tube has any of these problems: Severe pain, tenderness, or bloating in the abdomen. Nausea or vomiting. Trouble breathing or shortness of breath. Any of these problems happen in  the area where the tube enters the skin: Redness, irritation, swelling, or soreness. Pus-like discharge. A bad smell. The tube is clogged and cannot be flushed. The tube comes out. The tube will need to be put back in within 4 hours. Summary A gastrostomy tube, or G-tube, is a tube that is inserted through the abdomen into the stomach. The tube is used to give feedings and medicines when a person cannot eat and drink enough on his or her own or cannot take medicine by mouth. Check and clean the insertion site daily as told by the person's health care provider. Flush the G-tube regularly to keep it from clogging. Flush it before and after feedings and as often as told. Keep the area where the tube enters the skin clean and dry. This information is not intended to replace advice given to you by your health care provider. Make sure you discuss any questions you have with your healthcare provider.      Flush tube with 9 ml water after medications or at least once every 12 hours if not used for feeds. May begin using for feeds 12/31/2022, post scheduled appt with nutrition.  Cleanse site with soap and water, dress with triple-antibiotic ointment, and cover with gauze for 7 days. Do not use scissors near the catheter.     You may remove your dressing and shower tomorrow afternoon  DO NOT use EMLA cream for 2 weeks after port placement as the cream will remove surgical glue on your incision.    Implanted Port Insertion, Care After This sheet gives you information about how to care for yourself after your procedure. Your health care provider may also give you more specific instructions. If you have problems or questions, contact your health careprovider. What can I expect after the procedure? After the procedure, it is common to have: Discomfort at the port insertion site. Bruising on the skin over the port. This should improve over 3-4 days. Follow these instructions at home: [facility] care After  your port is placed, you will get a manufacturer's information card. The card has information about your port. Keep this card with you at all times. Take care of the port as told by your health care provider. Ask your health care provider if you or a family member can get training for taking care of the port at home. A home health care nurse may also take care of the port. Make sure to remember what type of port you have. Incision care Follow instructions from your health care provider about how to take care of your port insertion site. Make sure you: Wash your hands with soap and water before and after you change your bandage (dressing). If soap and water are not available, use hand sanitizer. Change your  dressing as told by your health care provider. Leave skin glue, or adhesive strips in place. These skin closures may need to stay in place for 2 weeks or longer.  Check your port insertion site every day for signs of infection. Check for:      - Redness, swelling, or pain.                     - Fluid or blood.      - Warmth.      - Pus or a bad smell. Activity Return to your normal activities as told by your health care provider. Ask your health care provider what activities are safe for you. Do not lift anything that is heavier than 10 lb (4.5 kg), or the limit that you are told, until your health care provider says that it is safe. General instructions Take over-the-counter and prescription medicines only as told by your health care provider. Do not take baths, swim, or use a hot tub until your health care provider approves. Ask your health care provider if you may take showers. You may only be allowed to take sponge baths. Do not drive for 24 hours if you were given a sedative during your procedure. Wear a medical alert bracelet in case of an emergency. This will tell any health care providers that you have a port. Keep all follow-up visits as told by your health care provider. This is  important. Contact a health care provider if: You cannot flush your port with saline as directed, or you cannot draw blood from the port. You have a fever or chills. You have redness, swelling, or pain around your port insertion site. You have fluid or blood coming from your port insertion site. Your port insertion site feels warm to the touch. You have pus or a bad smell coming from the port insertion site. Get help right away if: You have chest pain or shortness of breath. You have bleeding from your port that you cannot control. Summary Take care of the port as told by your health care provider. Keep the manufacturer's information card with you at all times. Change your dressing as told by your health care provider. Contact a health care provider if you have a fever or chills or if you have redness, swelling, or pain around your port insertion site. Keep all follow-up visits as told by your health care provider. This information is not intended to replace advice given to you by your health care provider. Make sure you discuss any questions you have with your healthcare provider. Document Revised: 06/23/2018 Document Reviewed: 06/23/2018

## 2022-12-30 NOTE — Progress Notes (Addendum)
Call to Dr. Maryelizabeth Kaufmann. Pt's tube does not go to connector for wall suction or drainage bag. Per Dr. Maryelizabeth Kaufmann ok to manually decompress with syringe q1 hr. Pt in high fowlers during recovery. Ice chips only while here and then full liquid at home.  Pt. To stay in recovery for 4 hrs post 13:30. Mammie Russian called and is aware to pick pt up at 17:30.

## 2022-12-30 NOTE — Procedures (Signed)
Vascular and Interventional Radiology Procedure Note  Patient: Alec Gibson DOB: Oct 29, 1956 Medical Record Number: [id number] Note Date/Time: 12/30/22 1:30 PM   Performing Physician: Michaelle Birks, MD Assistant(s): None  Diagnosis: Head and Neck cancer  Procedure:  PORT PLACEMENT PERCUTANEOUS GASTROSTOMY TUBE PLACEMENT  Anesthesia: Conscious Sedation Complications: None Estimated Blood Loss: Minimal  Findings:  Successful right-sided port placement, with the tip of the catheter in the proximal right atrium. Plan: Catheter ready for use.  Successful placement of a 20F gastrostomy tube under fluoroscopy.   Plan: G-tube to gravity drainage bag x 24 hrs Liquid diet x 24 hrs OK to cap G-tube (during 24 hr period listed above) for 2 hours post liquid meal. OK for meds per tube 6 hrs post procedure. OK to begin TFs and G-tube use in 24 hrs, taper from trickle to goal as tolerated.   Pt is to return to [facility] for routine G-tube exchange in 6 months.  See detailed procedure note with images in PACS. The patient tolerated the procedure well without incident or complication and was returned to Recovery in stable condition.    Michaelle Birks, MD [facility]   Pager. [place]

## 2022-12-30 NOTE — Telephone Encounter (Signed)
Rn called short stay RN at request via private chat. Pt stated to Raquel Sarna he had several questions about his PEG tube. Rn has already been communicating with nutrition about this pt this am. Juliette Mangle let RN Raquel Sarna know that nutrition will see him tomorrow and answer questions for him. Hopefully this will help him feel more comfortable about the course of treatment with his PEG tube.

## 2022-12-30 NOTE — Progress Notes (Signed)
Call to Geoffry Paradise, RN nurse to Dr. Chryl Heck. Discussed continuity of care post G-Tube placement and when patient would start feeds, see dietary, etc.  For 24 hrs post placement patient will leave alone. Tomorrow 12/31/22, patient will see dietary post treatment - which is scheduled for 0900.  Discussed this with patient and he is aware of POC until he sees nutrition to learn re: feeds/flushes.

## 2022-12-30 NOTE — Sedation Documentation (Signed)
Port complete

## 2022-12-31 ENCOUNTER — Inpatient Hospital Stay: Payer: Medicare Other

## 2022-12-31 ENCOUNTER — Other Ambulatory Visit: Payer: Self-pay

## 2022-12-31 ENCOUNTER — Ambulatory Visit
Admission: RE | Admit: 2022-12-31 | Discharge: 2022-12-31 | Disposition: A | Discharge status: Home / Self Care | Payer: Medicare Other | Source: Ambulatory Visit | Attending: Radiation Oncology | Admitting: Radiation Oncology

## 2022-12-31 ENCOUNTER — Inpatient Hospital Stay: Payer: Medicare Other | Admitting: Dietician

## 2022-12-31 ENCOUNTER — Inpatient Hospital Stay (HOSPITAL_BASED_OUTPATIENT_CLINIC_OR_DEPARTMENT_OTHER): Payer: Medicare Other | Admitting: Hematology and Oncology

## 2022-12-31 VITALS — BP 116/79 | HR 78 | Temp 97.7°F | Resp 16 | Ht 69.0 in | Wt 136.8 lb

## 2022-12-31 DIAGNOSIS — Z95828 Presence of other vascular implants and grafts: Secondary | ICD-10-CM

## 2022-12-31 DIAGNOSIS — K1231 Oral mucositis (ulcerative) due to antineoplastic therapy: Secondary | ICD-10-CM

## 2022-12-31 DIAGNOSIS — R634 Abnormal weight loss: Secondary | ICD-10-CM

## 2022-12-31 DIAGNOSIS — C109 Malignant neoplasm of oropharynx, unspecified: Secondary | ICD-10-CM | POA: Diagnosis not present

## 2022-12-31 DIAGNOSIS — C090 Malignant neoplasm of tonsillar fossa: Secondary | ICD-10-CM

## 2022-12-31 DIAGNOSIS — Z510 Encounter for antineoplastic radiation therapy: Secondary | ICD-10-CM | POA: Diagnosis not present

## 2022-12-31 DIAGNOSIS — Z5111 Encounter for antineoplastic chemotherapy: Secondary | ICD-10-CM | POA: Diagnosis not present

## 2022-12-31 LAB — CBC WITH DIFFERENTIAL (CANCER CENTER ONLY)
Abs Immature Granulocytes: 0.03 10*3/uL (ref 0.00–0.07)
Basophils Absolute: 0 10*3/uL (ref 0.0–0.1)
Basophils Relative: 0 %
Eosinophils Absolute: 0 10*3/uL (ref 0.0–0.5)
Eosinophils Relative: 0 %
HCT: 39.7 % (ref 39.0–52.0)
Hemoglobin: 13.9 g/dL (ref 13.0–17.0)
Immature Granulocytes: 0 %
Lymphocytes Relative: 4 %
Lymphs Abs: 0.4 10*3/uL — ABNORMAL LOW (ref 0.7–4.0)
MCH: 32 pg (ref 26.0–34.0)
MCHC: 35 g/dL (ref 30.0–36.0)
MCV: 91.5 fL (ref 80.0–100.0)
Monocytes Absolute: 1 10*3/uL (ref 0.1–1.0)
Monocytes Relative: 11 %
Neutro Abs: 7.4 10*3/uL (ref 1.7–7.7)
Neutrophils Relative %: 85 %
Platelet Count: 209 10*3/uL (ref 150–400)
RBC: 4.34 MIL/uL (ref 4.22–5.81)
RDW: 13.8 % (ref 11.5–15.5)
WBC Count: 8.8 10*3/uL (ref 4.0–10.5)
nRBC: 0 % (ref 0.0–0.2)

## 2022-12-31 LAB — RAD ONC ARIA SESSION SUMMARY
Course Elapsed Days: 21
Plan Fractions Treated to Date: 15
Plan Prescribed Dose Per Fraction: 2 Gy
Plan Total Fractions Prescribed: 35
Plan Total Prescribed Dose: 70 Gy
Reference Point Dosage Given to Date: 30 Gy
Reference Point Session Dosage Given: 2 Gy
Session Number: 15

## 2022-12-31 LAB — BASIC METABOLIC PANEL - CANCER CENTER ONLY
Anion gap: 11 (ref 5–15)
BUN: 18 mg/dL (ref 8–23)
CO2: 29 mmol/L (ref 22–32)
Calcium: 9.9 mg/dL (ref 8.9–10.3)
Chloride: 94 mmol/L — ABNORMAL LOW (ref 98–111)
Creatinine: 0.78 mg/dL (ref 0.61–1.24)
GFR, Estimated: >60 mL/min (ref >60)
Glucose, Bld: 85 mg/dL (ref 70–99)
Potassium: 3.9 mmol/L (ref 3.5–5.1)
Sodium: 134 mmol/L — ABNORMAL LOW (ref 135–145)

## 2022-12-31 LAB — PHOSPHORUS: Phosphorus: 3.9 mg/dL (ref 2.5–4.6)

## 2022-12-31 LAB — MAGNESIUM: Magnesium: 1.7 mg/dL (ref 1.7–2.4)

## 2022-12-31 MED ORDER — SODIUM CHLORIDE 0.9% FLUSH: 10.0000 mL | INTRAVENOUS | Status: DC | PRN

## 2022-12-31 MED ORDER — HEPARIN SOD (PORK) LOCK FLUSH 100 UNIT/ML IV SOLN
500.0000 [IU] | INTRAVENOUS | Status: AC | PRN
  Administered 2022-12-31: 500 [IU]

## 2022-12-31 MED FILL — Dexamethasone Sodium Phosphate Inj 100 MG/10ML: INTRAMUSCULAR | Qty: 1 | Status: AC

## 2022-12-31 MED FILL — Fosaprepitant Dimeglumine For IV Infusion 150 MG (Base Eq): INTRAVENOUS | Qty: 5 | Status: AC

## 2022-12-31 NOTE — Progress Notes (Signed)
[facility] CONSULT NOTE  Patient Care Team: [name], NP as PCP - General (Family Medicine) [name], [name], RN as Oncology Nurse Navigator [name], MD as Consulting Physician (Hematology and Oncology) [name], MD as Consulting Physician (Radiation Oncology) [name], MD as Consulting Physician (Otolaryngology)  CHIEF COMPLAINTS/PURPOSE OF CONSULTATION:  SCC oropharynx  ASSESSMENT & PLAN:   #1 This is a very pleasant 67 year old healthy male patient with no significant past medical history newly diagnosed with squamous cell carcinoma both sides, locally advanced with ipsilateral and possibly contralateral lymphadenopathy as well presented to medical oncology for recommendations.  Given the invasion of skeletal muscle, he may be actually a T4 N2 M0 staging so far.    PET on 12/14 no evidence of metastatic disease.Large hypermetabolic right-sided oropharyngeal mass consistent with known squamous cell neoplasm. Associated extensive bilateral multistation hypermetabolic lymphadenopathy. We have discussed that for locally advanced HPV positive oropharyngeal cancers, primary treatment would be to recommend concurrent chemoradiation provided there is no evidence of metastatic disease. I have discussed about the regimen, schedule, adverse effects history significant for chest pain ototoxicity, toxicity, peripheral neuropathy.  He understands that some of the side effects can be life-threatening and permanent.  He has now completed 2 cycles of chemotherapy.  C3 held since patient was rapidly losing weight and couldn't tolerate PO intake, G Tube not in place. He now had the G tube placed, meeting nutrition team today. He is at risk for refeeding. He should labs drawn twice a week.  #2 cancer related pain, patient has been using hydrocodone once a day.  He is also using baking soda rinses for mouth sores.  #3  Unintentional weight loss, lost 5 pounds of  weight since last weight.  Once again, I hope his weight stabilizes with use of G tube.  # CINV, taking anti nausea medication as needed.  Please do not hesitate to contact us with any additional questions or concerns  HISTORY OF PRESENTING ILLNESS:  Alec Gibson 67 y.o. 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  Chronology  Alec Gibson is a 67 year old male patient no significant medical history who first noticed pain in the right side of his jaw as well as his ER about 3 to 4 months ago.  He used to notice it when he suddenly moves his head towards the right side but did not think much of it.  About for the past month and a half he had excruciating episodes of pain.  He describes this pain as it was not similar job and moves to his ear followed by severe pain in the neck.   The pain is so intense that he has to moan at times.   He got some medication from Dr. Sabino Gasser hydrocodone which she has been taking as needed, he does not really need 1 of this again.  Other than this pain, he denies any new complaints.  He is able to swallow everything.  He had CT imaging on 10/30/2022 which showed 3.5 x 2.3 cm solid mass lesion centered in the right Tonsil with effacement of the edges Glossotonsillar sulcus and likely posteromedial extent along the posterior oropharyngeal wall consistent with provided history of oropharyngeal neoplasm.  There is also a 1.4 cm morphologically suspicious right level 1B lymph node and additional suspected pathologic lymphadenopathy at level 2 with short segment occlusion of right internal jugular vein in the mid neck suboptimally evaluated due to streak artifact from dental amalgam. PET scan with no  metastatic disease.  C1D1 12/10/2022 C2D1 12/17/2022 Port and PEG tube scheduled for 12/13/2022  Interval History  He continues to feel tired and groggy, not able to eat. He lost about 5 lbs since last visit. He has some pain from the G tube insertion, some pain/sore throat. No major  change in hearing. NO neuropathy. Last BM a week ago Rest of the pertinent 10 point ROS reviewed and neg.  Wt Readings from Last 3 Encounters:  12/31/22 136 lb 12.8 oz (62.1 kg)  12/30/22 141 lb 1.5 oz (64 kg)  12/24/22 143 lb 3.2 oz (65 kg)     MEDICAL HISTORY:  Past Medical History:  Diagnosis Date   Anxiety    Depression    Thyroid disease     SURGICAL HISTORY: Past Surgical History:  Procedure Laterality Date   IR GASTROSTOMY TUBE MOD SED  12/30/2022   IR IMAGING GUIDED PORT INSERTION  12/30/2022   IR NASO G TUBE PLC W/FL W/RAD  12/30/2022    SOCIAL HISTORY: Social History   Socioeconomic History   Marital status: Divorced    Spouse name: Not on file   Number of children: Not on file   Years of education: Not on file   Highest education level: Not on file  Occupational History   Not on file  Tobacco Use   Smoking status: Never   Smokeless tobacco: Never  Substance and Sexual Activity   Alcohol use: Yes   Drug use: Never   Sexual activity: Not on file  Other Topics Concern   Not on file  Social History Narrative   Not on file   Social Determinants of Health   Financial Resource Strain: Medium Risk (12/12/2022)   Overall Financial Resource Strain (CARDIA)    Difficulty of Paying Living Expenses: Somewhat hard  Food Insecurity: No Food Insecurity (12/12/2022)   Hunger Vital Sign    Worried About Running Out of Food in the Last Year: Never true    Ran Out of Food in the Last Year: Never true  Transportation Needs: No Transportation Needs (12/12/2022)   PRAPARE - Hydrologist (Medical): No    Lack of Transportation (Non-Medical): No  Physical Activity: Not on file  Stress: Not on file  Social Connections: Not on file  Intimate Partner Violence: Not on file    FAMILY HISTORY: No family history on file.  ALLERGIES:  is allergic to sulfa antibiotics.  MEDICATIONS:  Current Outpatient Medications  Medication Sig Dispense Refill    Cholecalciferol (VITAMIN D-3 PO) Take 1 capsule by mouth in the morning.     clonazePAM (KLONOPIN) 1 MG tablet Take 1 mg by mouth 2 (two) times daily as needed for anxiety (sleep).     dexamethasone (DECADRON) 4 MG tablet Take 2 tablets daily x 3 days starting the day after cisplatin chemotherapy. Take with food. (Patient not taking: Reported on 12/13/2022) 30 tablet 1   HYDROcodone-acetaminophen (NORCO/VICODIN) 5-325 MG tablet Take 1 tablet by mouth every 6 (six) hours as needed for moderate pain. 30 tablet 0   levocetirizine (XYZAL) 5 MG tablet Take 5 mg by mouth in the morning.     levothyroxine (SYNTHROID) 175 MCG tablet Take 1 tablet (175 mcg total) by mouth daily before breakfast. 30 tablet 0   lidocaine (XYLOCAINE) 2 % solution Patient: Mix 1part 2% viscous lidocaine, 1part H20. Swish & swallow 68m of diluted mixture, 341m before meals and at bedtime, up to QID 200 mL 3  lidocaine-prilocaine (EMLA) cream Apply to affected area once (Patient not taking: Reported on 12/13/2022) 30 g 3   Multiple Vitamin (MULTIVITAMIN) tablet Take 1 tablet by mouth in the morning.     Multiple Vitamins-Minerals (ZINC PO) Take 1 tablet by mouth in the morning.     Omega-3 Fatty Acids (FISH OIL PO) Take 1 capsule by mouth in the morning.     ondansetron (ZOFRAN) 8 MG tablet Take 1 tablet (8 mg total) by mouth every 8 (eight) hours as needed for nausea or vomiting. Start on the third day after cisplatin. 30 tablet 1   prochlorperazine (COMPAZINE) 10 MG tablet Take 1 tablet (10 mg total) by mouth every 6 (six) hours as needed (Nausea or vomiting). (Patient not taking: Reported on 12/13/2022) 30 tablet 1   VITAMIN E PO Take 1 capsule by mouth in the morning.     No current facility-administered medications for this visit.   Facility-Administered Medications Ordered in Other Visits  Medication Dose Route Frequency Provider Last Rate Last Admin   sodium chloride flush (NS) 0.9 % injection 10 mL  10 mL Intracatheter  PRN Ednamae Schiano, MD         PHYSICAL EXAMINATION: ECOG PERFORMANCE STATUS: 1 - Symptomatic but completely ambulatory  Vitals:   12/31/22 0850  BP: 116/79  Pulse: 78  Resp: 16  Temp: 97.7 F (36.5 C)  SpO2: 100%    Filed Weights   12/31/22 0850  Weight: 136 lb 12.8 oz (62.1 kg)    GENERAL:alert, no distress and comfortable Neck: palpable left upper cervical LN, smaller than before. Abdomen: G tube in place. No BLE edema  LABORATORY DATA:  I have reviewed the data as listed Lab Results  Component Value Date   WBC 8.8 12/31/2022   HGB 13.9 12/31/2022   HCT 39.7 12/31/2022   MCV 91.5 12/31/2022   PLT 209 12/31/2022     Chemistry      Component Value Date/Time   NA 134 (L) 12/23/2022 0850   K 4.0 12/23/2022 0850   CL 96 (L) 12/23/2022 0850   CO2 32 12/23/2022 0850   BUN 22 12/23/2022 0850   CREATININE 0.87 12/23/2022 0850      Component Value Date/Time   CALCIUM 9.7 12/23/2022 0850       RADIOGRAPHIC STUDIES: I have personally reviewed the radiological images as listed and agreed with the findings in the report. IR Gastrostomy Tube  Result Date: 12/30/2022 INDICATION: Head and neck cancer EXAM: Procedures: 1. FLUOROSCOPIC NASOGASTRIC TUBE PLACEMENT 2. PERCUTANEOUS GASTROSTOMY FEEDING TUBE PLACEMENT COMPARISON:  PET-CT, 11/21/2022. MEDICATIONS: Ancef 2 gm IV; Antibiotics were administered within 1 hour of the procedure. 0.5 mg glucagon IV. CONTRAST:  30 mL of Isovue 300 administered into the gastric lumen. ANESTHESIA/SEDATION: Moderate (conscious) sedation was employed during this procedure. A total of Versed 1 mg and Fentanyl 100 mcg was administered intravenously. Moderate Sedation Time: 21 minutes. The patient's level of consciousness and vital signs were monitored continuously by radiology nursing throughout the procedure under my direct supervision. FLUOROSCOPY TIME:  Fluoroscopic dose; 2 mGy COMPLICATIONS: None immediate. PROCEDURE: Informed written  consent was obtained from the patient and/or patient's representative following explanation of the procedure, risks, benefits and alternatives. A time out was performed prior to the initiation of the procedure. Maximal barrier sterile technique utilized including caps, mask, sterile gowns, sterile gloves, large sterile drape, hand hygiene and Betadine prep. The LEFT upper quadrant was sterilely prepped and draped. A oral gastric catheter was inserted  into the stomach under fluoroscopy. The existing nasogastric feeding tube was removed. The left costal margin and barium opacified transverse colon were identified and avoided. Air was injected into the stomach for insufflation and visualization under fluoroscopy. Under sterile conditions and local anesthesia, 3 T tacks were utilized to pexy the anterior aspect of the stomach against the ventral abdominal wall. Contrast injection confirmed appropriate positioning of each of the T tacks. An incision was made between the T tacks and a 17 gauge trocar needle was utilized to access the stomach. Needle position was confirmed within the stomach with aspiration of air and injection of a small amount of contrast. A stiff Glidewire was advanced into the gastric lumen and under intermittent fluoroscopic guidance, the access needle was exchanged for a Kumpe catheter. With the use of the Kumpe catheter, a stiff Glidewire was advanced into the horizontal segment of the duodenum. Under intermittent fluoroscopic guidance, the Kumpe catheter was exchanged for a telescoping peel-away sheath, ultimately allowing placement of a 18-French balloon retention gastrostomy tube. The retention balloon was insufflated with a mixture of dilute saline and contrast and pulled taut against the anterior wall of the stomach. The external disc was cinched. Contrast injection confirms positioning within the stomach. Several spot radiographic images were obtained in various obliquities for documentation.  T he patient tolerated procedure well without immediate post procedural complication. FINDINGS: After successful fluoroscopic guided placement, the gastrostomy tube is appropriately positioned with internal retention balloon against the ventral aspect of the gastric lumen. IMPRESSION: Successful percutaneous placement of a 16 Fr balloon retention gastrostomy tube. PLAN: *G-tube to gravity drainage bag x 24 hrs. Liquid diet x 24 hrs *OK to cap G-tube (during 24 hr period listed above) for 2 hours post liquid meal. *OK for meds per tube 6 hrs post procedure. *OK to begin TFs and G-tube use in 24 hrs, taper from trickle to goal as tolerated. *Pt is to return to [facility] for routine G-tube exchange in 6 months. Michaelle Birks, MD [facility] Radiology Electronically Signed   By: Michaelle Birks M.D.   On: 12/30/2022 17:56   IR [facility] Dys Tube Plc W/FL W/Rad  Result Date: 12/30/2022 INDICATION: Head and neck cancer EXAM: Procedures: 1. FLUOROSCOPIC NASOGASTRIC TUBE PLACEMENT 2. PERCUTANEOUS GASTROSTOMY FEEDING TUBE PLACEMENT COMPARISON:  PET-CT, 11/21/2022. MEDICATIONS: Ancef 2 gm IV; Antibiotics were administered within 1 hour of the procedure. 0.5 mg glucagon IV. CONTRAST:  30 mL of Isovue 300 administered into the gastric lumen. ANESTHESIA/SEDATION: Moderate (conscious) sedation was employed during this procedure. A total of Versed 1 mg and Fentanyl 100 mcg was administered intravenously. Moderate Sedation Time: 21 minutes. The patient's level of consciousness and vital signs were monitored continuously by radiology nursing throughout the procedure under my direct supervision. FLUOROSCOPY TIME:  Fluoroscopic dose; 2 mGy COMPLICATIONS: None immediate. PROCEDURE: Informed written consent was obtained from the patient and/or patient's representative following explanation of the procedure, risks, benefits and alternatives. A time out was performed prior to the initiation of the  procedure. Maximal barrier sterile technique utilized including caps, mask, sterile gowns, sterile gloves, large sterile drape, hand hygiene and Betadine prep. The LEFT upper quadrant was sterilely prepped and draped. A oral gastric catheter was inserted into the stomach under fluoroscopy. The existing nasogastric feeding tube was removed. The left costal margin and barium opacified transverse colon were identified and avoided. Air was injected into the stomach for insufflation and visualization under fluoroscopy. Under sterile conditions and local anesthesia, 3  T tacks were utilized to pexy the anterior aspect of the stomach against the ventral abdominal wall. Contrast injection confirmed appropriate positioning of each of the T tacks. An incision was made between the T tacks and a 17 gauge trocar needle was utilized to access the stomach. Needle position was confirmed within the stomach with aspiration of air and injection of a small amount of contrast. A stiff Glidewire was advanced into the gastric lumen and under intermittent fluoroscopic guidance, the access needle was exchanged for a Kumpe catheter. With the use of the Kumpe catheter, a stiff Glidewire was advanced into the horizontal segment of the duodenum. Under intermittent fluoroscopic guidance, the Kumpe catheter was exchanged for a telescoping peel-away sheath, ultimately allowing placement of a 18-French balloon retention gastrostomy tube. The retention balloon was insufflated with a mixture of dilute saline and contrast and pulled taut against the anterior wall of the stomach. The external disc was cinched. Contrast injection confirms positioning within the stomach. Several spot radiographic images were obtained in various obliquities for documentation. T he patient tolerated procedure well without immediate post procedural complication. FINDINGS: After successful fluoroscopic guided placement, the gastrostomy tube is appropriately positioned with  internal retention balloon against the ventral aspect of the gastric lumen. IMPRESSION: Successful percutaneous placement of a 16 Fr balloon retention gastrostomy tube. PLAN: *G-tube to gravity drainage bag x 24 hrs. Liquid diet x 24 hrs *OK to cap G-tube (during 24 hr period listed above) for 2 hours post liquid meal. *OK for meds per tube 6 hrs post procedure. *OK to begin TFs and G-tube use in 24 hrs, taper from trickle to goal as tolerated. *Pt is to return to [facility] for routine G-tube exchange in 6 months. Michaelle Birks, MD [facility] Radiology Electronically Signed   By: Michaelle Birks M.D.   On: 12/30/2022 17:56   IR IMAGING GUIDED PORT INSERTION  Result Date: 12/30/2022 INDICATION: Head and neck cancer.  Chemotherapy recommended. EXAM: IMPLANTED PORT A CATH PLACEMENT WITH ULTRASOUND AND FLUOROSCOPIC GUIDANCE MEDICATIONS: None ANESTHESIA/SEDATION: Moderate (conscious) sedation was employed during this procedure. A total of Versed 2 mg and Fentanyl 100 mcg was administered intravenously. Moderate Sedation Time: 22 minutes. The patient's level of consciousness and vital signs were monitored continuously by radiology nursing throughout the procedure under my direct supervision. FLUOROSCOPY TIME:  Fluoroscopic dose; 0 mGy COMPLICATIONS: None immediate. PROCEDURE: The procedure, risks, benefits, and alternatives were explained to the patient. Questions regarding the procedure were encouraged and answered. The patient understands and consents to the procedure. The RIGHT neck and chest were prepped with chlorhexidine in a sterile fashion, and a sterile drape was applied covering the operative field. Maximum barrier sterile technique with sterile gowns and gloves were used for the procedure. A timeout was performed prior to the initiation of the procedure. Local anesthesia was provided with 1% lidocaine with epinephrine. After creating a small venotomy incision, a  micropuncture kit was utilized to access the internal jugular vein under direct, real-time ultrasound guidance. Ultrasound image documentation was performed. The microwire was kinked to measure appropriate catheter length. A subcutaneous port pocket was then created along the upper chest wall utilizing a combination of sharp and blunt dissection. The pocket was irrigated with sterile saline. A single lumen ISP power injectable port was chosen for placement. The 8 Fr catheter was tunneled from the port pocket site to the venotomy incision. The port was placed in the pocket. The external catheter was trimmed to appropriate length.  At the venotomy, an 8 Fr peel-away sheath was placed over a guidewire under fluoroscopic guidance. The catheter was then placed through the sheath and the sheath was removed. Final catheter positioning was confirmed and documented with a fluoroscopic spot radiograph. The port was accessed with a Huber needle, aspirated and flushed with heparinized saline. The port pocket incision was closed with interrupted 3-0 Vicryl suture then Dermabond was applied, including at the venotomy incision. Dressings were placed. The patient tolerated the procedure well without immediate post procedural complication. IMPRESSION: Successful placement of a RIGHT internal jugular approach power injectable Port-A-Cath. The tip of the catheter is positioned at the superior cavo-atrial junction. The catheter is ready for immediate use. Michaelle Birks, MD [facility] Radiology Electronically Signed   By: Michaelle Birks M.D.   On: 12/30/2022 13:47   ECHOCARDIOGRAM COMPLETE  Result Date: 12/13/2022    ECHOCARDIOGRAM REPORT   Patient Name:   Alec Gibson Date of Exam: 12/13/2022 Medical Rec #:  [id number]   Height:       69.0 in Accession #:    [id number]  Weight:       152.2 lb Date of Birth:  [birth year]    BSA:          1.840 m Patient Age:    16 years    BP:           96/65  mmHg Patient Gender: M           HR:           67 bpm. Exam Location:  Inpatient Procedure: 2D Echo, Cardiac Doppler and Color Doppler Indications:   Syncope R55  History:       Patient has no prior history of Echocardiogram examinations.                Cancer.  Sonographer:   Ronny Flurry Referring      [place]: IMPRESSIONS  1. Left ventricular ejection fraction, by estimation, is 60 to 65%. The left ventricle has normal function. The left ventricle has no regional wall motion abnormalities. Left ventricular diastolic parameters are indeterminate.  2. Right ventricular systolic function is normal. The right ventricular size is normal.  3. No evidence of mitral valve regurgitation.  4. The aortic valve is tricuspid. Aortic valve regurgitation is not visualized.  5. The inferior vena cava is normal in size with greater than 50% respiratory variability, suggesting right atrial pressure of 3 mmHg. Comparison(s): No prior Echocardiogram. FINDINGS  Left Ventricle: Left ventricular ejection fraction, by estimation, is 60 to 65%. The left ventricle has normal function. The left ventricle has no regional wall motion abnormalities. The left ventricular internal cavity size was normal in size. There is  no left ventricular hypertrophy. Left ventricular diastolic parameters are indeterminate. Right Ventricle: The right ventricular size is normal. Right ventricular systolic function is normal. Left Atrium: Left atrial size was normal in size. Right Atrium: Right atrial size was normal in size. Pericardium: There is no evidence of pericardial effusion. Mitral Valve: No evidence of mitral valve regurgitation. Tricuspid Valve: Tricuspid valve regurgitation is not demonstrated. Aortic Valve: The aortic valve is tricuspid. Aortic valve regurgitation is not visualized. Aortic valve mean gradient measures 2.0 mmHg. Aortic valve peak gradient measures 3.8 mmHg. Aortic valve area, by VTI measures 3.09 cm. Pulmonic  Valve: Pulmonic valve regurgitation is not visualized. Aorta: The aortic root and ascending aorta are structurally normal, with no evidence of dilitation.  Venous: The inferior vena cava is normal in size with greater than 50% respiratory variability, suggesting right atrial pressure of 3 mmHg. IAS/Shunts: No atrial level shunt detected by color flow Doppler.  LEFT VENTRICLE PLAX 2D LVIDd:         4.00 cm   Diastology LVIDs:         3.00 cm   LV e' medial:    8.05 cm/s LV PW:         0.90 cm   LV E/e' medial:  10.4 LV IVS:        0.70 cm   LV e' lateral:   9.14 cm/s LVOT diam:     2.00 cm   LV E/e' lateral: 9.1 LV SV:         62 LV SV Index:   33 LVOT Area:     3.14 cm  RIGHT VENTRICLE             IVC RV S prime:     13.80 cm/s  IVC diam: 1.50 cm TAPSE (M-mode): 2.2 cm LEFT ATRIUM           Index        RIGHT ATRIUM           Index LA diam:      3.10 cm 1.68 cm/m   RA Area:     14.30 cm LA Vol (A2C): 15.3 ml 8.32 ml/m   RA Volume:   36.30 ml  19.73 ml/m LA Vol (A4C): 30.6 ml 16.63 ml/m  AORTIC VALVE AV Area (Vmax):    2.98 cm AV Area (Vmean):   2.87 cm AV Area (VTI):     3.09 cm AV Vmax:           97.70 cm/s AV Vmean:          63.400 cm/s AV VTI:            0.199 m AV Peak Grad:      3.8 mmHg AV Mean Grad:      2.0 mmHg LVOT Vmax:         92.63 cm/s LVOT Vmean:        57.867 cm/s LVOT VTI:          0.196 m LVOT/AV VTI ratio: 0.98  AORTA Ao Root diam: 3.40 cm Ao Asc diam:  3.40 cm MITRAL VALVE MV Area (PHT): 3.60 cm    SHUNTS MV Decel Time: 211 msec    Systemic VTI:  0.20 m MV E velocity: 83.40 cm/s  Systemic Diam: 2.00 cm MV A velocity: 80.00 cm/s MV E/A ratio:  1.04 Mary Scientist, physiological signed by Phineas Inches Signature Date/Time: 12/13/2022/3:09:21 PM    Final     All questions were answered. The patient knows to call the clinic with any problems, questions or concerns. I spent 30 minutes in the care of this patient including H and P, review of records, counseling and coordination of care.      [name], MD 12/31/2022 9:05 AM

## 2022-12-31 NOTE — Progress Notes (Signed)
Nutrition Follow-up:  Patient with SCC of oropharynx. He is receiving concurrent chemoradiation under the care of Dr. Chryl Heck and Dr. Isidore Moos (start 1/3). Chemo held 1/17   1/22 - PEG  Met with patient in office. He reports feeling sore s/p PEG/port placement yesterday. Patient has ongoing sore throat and thick saliva. He has not been eating. Says even seeing food on television makes him nauseas. Patient reports he has only tolerated ice chips the last few days. He is ready to start using feeding tube. Nurse navigator able to see patient in nutrition office to change bandage this morning.    Medications: reviewed   Labs: Na 134 Mg/K/Phos - WNL  Anthropometrics: Wt 136 lb 12.8 oz today decreased 5% in one week - severe  1/16 - 143 lb 3.2 oz 1/9 - 148 lb 4.8 oz  1/2 - 152 lb 9.6 oz    Estimated Energy Needs  Kcals: 2180-2545 Protein: 95-116 Fluid: >2.1 L  NUTRITION DIAGNOSIS: Unintended weight loss continues - starting tube feeds    INTERVENTION:  Provided feeding tube education. Patient successfully demonstrated water flush and bolus feeding. He tolerated 1/2 carton Osmolite 1.5. Patient flushed tube with 60 ml water before and after Monitoring labs as pt at increased risk for refeeding. Will titrate feedings to goal. Patient will give 2.5 cartons of Osmolite today and increase by 1/2 carton each day to goal of 7 cartons. Instructions written down for patients. Teach back method used Will contact [employer] to set up delivery of formula and supplies   Goal: 7 cartons Osmolite 1.5 split over four feedings per day (1680 ml/day) Flush tube with 60 ml water before and after each feeding. Drink by mouth or give via tube additional 2 cups (480 ml) water daily. This provides 2485 kcal, 104.3 grams protein, 1274m free water (2227 ml total water with flushes) Meets 100% needs     MONITORING, EVALUATION, GOAL: weight trends, tube feeding, oral intake, labs   NEXT VISIT: Wednesday January 24  during infusion

## 2023-01-01 ENCOUNTER — Other Ambulatory Visit: Payer: Self-pay

## 2023-01-01 ENCOUNTER — Ambulatory Visit
Admission: RE | Admit: 2023-01-01 | Discharge: 2023-01-01 | Disposition: A | Discharge status: Home / Self Care | Payer: Medicare Other | Source: Ambulatory Visit | Attending: Radiation Oncology | Admitting: Radiation Oncology

## 2023-01-01 ENCOUNTER — Inpatient Hospital Stay: Payer: Medicare Other | Admitting: Dietician

## 2023-01-01 ENCOUNTER — Inpatient Hospital Stay: Payer: Medicare Other

## 2023-01-01 VITALS — BP 107/69 | HR 77 | Temp 98.3°F | Resp 17

## 2023-01-01 DIAGNOSIS — C109 Malignant neoplasm of oropharynx, unspecified: Secondary | ICD-10-CM

## 2023-01-01 DIAGNOSIS — Z5111 Encounter for antineoplastic chemotherapy: Secondary | ICD-10-CM | POA: Diagnosis not present

## 2023-01-01 DIAGNOSIS — C090 Malignant neoplasm of tonsillar fossa: Secondary | ICD-10-CM | POA: Diagnosis not present

## 2023-01-01 DIAGNOSIS — Z510 Encounter for antineoplastic radiation therapy: Secondary | ICD-10-CM | POA: Diagnosis not present

## 2023-01-01 LAB — RAD ONC ARIA SESSION SUMMARY
Course Elapsed Days: 22
Plan Fractions Treated to Date: 16
Plan Prescribed Dose Per Fraction: 2 Gy
Plan Total Fractions Prescribed: 35
Plan Total Prescribed Dose: 70 Gy
Reference Point Dosage Given to Date: 32 Gy
Reference Point Session Dosage Given: 2 Gy
Session Number: 16

## 2023-01-01 MED ORDER — SODIUM CHLORIDE 0.9 % IV SOLN
150.0000 mg | Freq: Once | INTRAVENOUS | Status: AC
  Administered 2023-01-01: 150 mg via INTRAVENOUS
  Filled 2023-01-01: qty 5
  Filled 2023-01-01: qty 150

## 2023-01-01 MED ORDER — SODIUM CHLORIDE 0.9 % IV SOLN: Freq: Once | INTRAVENOUS | Status: AC

## 2023-01-01 MED ORDER — SODIUM CHLORIDE 0.9 % IV SOLN
10.0000 mg | Freq: Once | INTRAVENOUS | Status: AC
  Administered 2023-01-01: 10 mg via INTRAVENOUS
  Filled 2023-01-01: qty 1
  Filled 2023-01-01: qty 10

## 2023-01-01 MED ORDER — PALONOSETRON HCL INJECTION 0.25 MG/5ML
0.2500 mg | Freq: Once | INTRAVENOUS | Status: AC
  Administered 2023-01-01: 0.25 mg via INTRAVENOUS
  Filled 2023-01-01: qty 5

## 2023-01-01 MED ORDER — MAGNESIUM SULFATE 2 GM/50ML IV SOLN
2.0000 g | Freq: Once | INTRAVENOUS | Status: AC
  Administered 2023-01-01: 2 g via INTRAVENOUS
  Filled 2023-01-01: qty 50

## 2023-01-01 MED ORDER — POTASSIUM CHLORIDE IN NACL 20-0.9 MEQ/L-% IV SOLN
Freq: Once | INTRAVENOUS | Status: AC
  Filled 2023-01-01: qty 1000

## 2023-01-01 MED ORDER — SODIUM CHLORIDE 0.9 % IV SOLN
40.0000 mg/m2 | Freq: Once | INTRAVENOUS | Status: AC
  Administered 2023-01-01: 74 mg via INTRAVENOUS
  Filled 2023-01-01: qty 74

## 2023-01-01 MED ORDER — HEPARIN SOD (PORK) LOCK FLUSH 100 UNIT/ML IV SOLN
500.0000 [IU] | Freq: Once | INTRAVENOUS | Status: AC | PRN
  Administered 2023-01-01: 500 [IU]

## 2023-01-01 MED ORDER — SODIUM CHLORIDE 0.9% FLUSH
10.0000 mL | INTRAVENOUS | Status: DC | PRN
  Administered 2023-01-01: 10 mL

## 2023-01-01 NOTE — Progress Notes (Signed)
Nutrition Follow-up:  Patient with SCC of oropharynx. He is receiving concurrent chemoradiation under the care of Dr. Chryl Heck and Dr. Isidore Moos (start 1/3). Chemo held 1/17    PEG/Port - 1/22  Brief nutrition follow-up completed with patient during infusion. Patient reports tolerating 2 additional bolus feedings yesterday. He did not get in third feeding because he fell asleep. Patient forgot to bring formula and syringe with him today. RD provided this for patient. Patient gave half carton of Osmolite 1.5 with 60 ml water flush before and after during visit. He will give the remaining half of carton later today. Carton placed in zip lock bag with pt name and date and put in the door of nourishment refrigerator. Infusion RN aware.      NEXT VISIT: Wednesday January 31 during infusion

## 2023-01-01 NOTE — Progress Notes (Signed)
Proceed with treatment with 123m urine output and administer an additional 505mNS over 1 hour post Cisplatin per Dr. IrChryl Heck

## 2023-01-02 ENCOUNTER — Ambulatory Visit
Admission: RE | Admit: 2023-01-02 | Discharge: 2023-01-02 | Disposition: A | Discharge status: Home / Self Care | Payer: Medicare Other | Source: Ambulatory Visit | Attending: Radiation Oncology | Admitting: Radiation Oncology

## 2023-01-02 ENCOUNTER — Other Ambulatory Visit: Payer: Self-pay

## 2023-01-02 DIAGNOSIS — C090 Malignant neoplasm of tonsillar fossa: Secondary | ICD-10-CM | POA: Diagnosis not present

## 2023-01-02 DIAGNOSIS — Z510 Encounter for antineoplastic radiation therapy: Secondary | ICD-10-CM | POA: Diagnosis not present

## 2023-01-02 DIAGNOSIS — C109 Malignant neoplasm of oropharynx, unspecified: Secondary | ICD-10-CM | POA: Diagnosis not present

## 2023-01-02 LAB — RAD ONC ARIA SESSION SUMMARY
Course Elapsed Days: 23
Plan Fractions Treated to Date: 17
Plan Prescribed Dose Per Fraction: 2 Gy
Plan Total Fractions Prescribed: 35
Plan Total Prescribed Dose: 70 Gy
Reference Point Dosage Given to Date: 34 Gy
Reference Point Session Dosage Given: 2 Gy
Session Number: 17

## 2023-01-03 ENCOUNTER — Ambulatory Visit
Admission: RE | Admit: 2023-01-03 | Discharge: 2023-01-03 | Disposition: A | Discharge status: Home / Self Care | Payer: Medicare Other | Source: Ambulatory Visit | Attending: Radiation Oncology | Admitting: Radiation Oncology

## 2023-01-03 ENCOUNTER — Other Ambulatory Visit: Payer: Self-pay

## 2023-01-03 ENCOUNTER — Inpatient Hospital Stay: Payer: Medicare Other

## 2023-01-03 DIAGNOSIS — R634 Abnormal weight loss: Secondary | ICD-10-CM

## 2023-01-03 DIAGNOSIS — Z510 Encounter for antineoplastic radiation therapy: Secondary | ICD-10-CM | POA: Diagnosis not present

## 2023-01-03 DIAGNOSIS — C109 Malignant neoplasm of oropharynx, unspecified: Secondary | ICD-10-CM | POA: Diagnosis not present

## 2023-01-03 DIAGNOSIS — Z5111 Encounter for antineoplastic chemotherapy: Secondary | ICD-10-CM | POA: Diagnosis not present

## 2023-01-03 DIAGNOSIS — Z95828 Presence of other vascular implants and grafts: Secondary | ICD-10-CM | POA: Insufficient documentation

## 2023-01-03 DIAGNOSIS — C090 Malignant neoplasm of tonsillar fossa: Secondary | ICD-10-CM | POA: Diagnosis not present

## 2023-01-03 LAB — RAD ONC ARIA SESSION SUMMARY
Course Elapsed Days: 24
Plan Fractions Treated to Date: 18
Plan Prescribed Dose Per Fraction: 2 Gy
Plan Total Fractions Prescribed: 35
Plan Total Prescribed Dose: 70 Gy
Reference Point Dosage Given to Date: 36 Gy
Reference Point Session Dosage Given: 2 Gy
Session Number: 18

## 2023-01-03 LAB — CMP (CANCER CENTER ONLY)
ALT: 27 U/L (ref 0–44)
AST: 27 U/L (ref 15–41)
Albumin: 3.8 g/dL (ref 3.5–5.0)
Alkaline Phosphatase: 53 U/L (ref 38–126)
Anion gap: 6 (ref 5–15)
BUN: 17 mg/dL (ref 8–23)
CO2: 33 mmol/L — ABNORMAL HIGH (ref 22–32)
Calcium: 9.6 mg/dL (ref 8.9–10.3)
Chloride: 96 mmol/L — ABNORMAL LOW (ref 98–111)
Creatinine: 0.76 mg/dL (ref 0.61–1.24)
GFR, Estimated: >60 mL/min (ref >60)
Glucose, Bld: 80 mg/dL (ref 70–99)
Potassium: 3.9 mmol/L (ref 3.5–5.1)
Sodium: 135 mmol/L (ref 135–145)
Total Bilirubin: 0.4 mg/dL (ref 0.3–1.2)
Total Protein: 6.7 g/dL (ref 6.5–8.1)

## 2023-01-03 LAB — MAGNESIUM: Magnesium: 2.1 mg/dL (ref 1.7–2.4)

## 2023-01-03 LAB — PHOSPHORUS: Phosphorus: 3.9 mg/dL (ref 2.5–4.6)

## 2023-01-03 MED ORDER — HEPARIN SOD (PORK) LOCK FLUSH 100 UNIT/ML IV SOLN
500.0000 [IU] | Freq: Once | INTRAVENOUS | Status: AC
  Administered 2023-01-03: 500 [IU]

## 2023-01-03 MED ORDER — SODIUM CHLORIDE 0.9% FLUSH
10.0000 mL | Freq: Once | INTRAVENOUS | Status: AC
  Administered 2023-01-03: 10 mL

## 2023-01-06 ENCOUNTER — Ambulatory Visit
Admission: RE | Admit: 2023-01-06 | Discharge: 2023-01-06 | Disposition: A | Discharge status: Home / Self Care | Payer: Medicare Other | Source: Ambulatory Visit | Attending: Radiation Oncology | Admitting: Radiation Oncology

## 2023-01-06 ENCOUNTER — Other Ambulatory Visit: Payer: Self-pay

## 2023-01-06 DIAGNOSIS — C090 Malignant neoplasm of tonsillar fossa: Secondary | ICD-10-CM | POA: Diagnosis not present

## 2023-01-06 DIAGNOSIS — Z510 Encounter for antineoplastic radiation therapy: Secondary | ICD-10-CM | POA: Diagnosis not present

## 2023-01-06 DIAGNOSIS — C109 Malignant neoplasm of oropharynx, unspecified: Secondary | ICD-10-CM | POA: Diagnosis not present

## 2023-01-06 LAB — RAD ONC ARIA SESSION SUMMARY
Course Elapsed Days: 27
Plan Fractions Treated to Date: 19
Plan Prescribed Dose Per Fraction: 2 Gy
Plan Total Fractions Prescribed: 35
Plan Total Prescribed Dose: 70 Gy
Reference Point Dosage Given to Date: 38 Gy
Reference Point Session Dosage Given: 2 Gy
Session Number: 19

## 2023-01-06 NOTE — Progress Notes (Signed)
Oncology Nurse Navigator Documentation   I visited with Alec Gibson during his weekly undertreat visit with Dr. Isidore Moos. He reported to me that the t-tacs at his PEG site were very painful and causing him distress and he requested that I remove them. I used suture scissors to remove both t-tacs without any difficulty. He reported immediate relief. I have notified IR that they can cancel their appointment to remove them on 2/2.  Harlow Asa RN, BSN, OCN Head & Neck Oncology Nurse [place] at [facility] Phone # [phone]  Fax # [phone]

## 2023-01-07 ENCOUNTER — Other Ambulatory Visit: Payer: Self-pay

## 2023-01-07 ENCOUNTER — Encounter: Payer: Self-pay | Admitting: Hematology and Oncology

## 2023-01-07 ENCOUNTER — Inpatient Hospital Stay: Payer: Medicare Other

## 2023-01-07 ENCOUNTER — Ambulatory Visit
Admission: RE | Admit: 2023-01-07 | Discharge: 2023-01-07 | Disposition: A | Discharge status: Home / Self Care | Payer: Medicare Other | Source: Ambulatory Visit | Attending: Radiation Oncology | Admitting: Radiation Oncology

## 2023-01-07 ENCOUNTER — Inpatient Hospital Stay (HOSPITAL_BASED_OUTPATIENT_CLINIC_OR_DEPARTMENT_OTHER): Payer: Medicare Other | Admitting: Hematology and Oncology

## 2023-01-07 DIAGNOSIS — Z510 Encounter for antineoplastic radiation therapy: Secondary | ICD-10-CM | POA: Diagnosis not present

## 2023-01-07 DIAGNOSIS — C090 Malignant neoplasm of tonsillar fossa: Secondary | ICD-10-CM | POA: Diagnosis not present

## 2023-01-07 DIAGNOSIS — Z95828 Presence of other vascular implants and grafts: Secondary | ICD-10-CM

## 2023-01-07 DIAGNOSIS — C109 Malignant neoplasm of oropharynx, unspecified: Secondary | ICD-10-CM

## 2023-01-07 DIAGNOSIS — Z5111 Encounter for antineoplastic chemotherapy: Secondary | ICD-10-CM | POA: Diagnosis not present

## 2023-01-07 LAB — PHOSPHORUS: Phosphorus: 4.2 mg/dL (ref 2.5–4.6)

## 2023-01-07 LAB — RAD ONC ARIA SESSION SUMMARY
Course Elapsed Days: 28
Plan Fractions Treated to Date: 20
Plan Prescribed Dose Per Fraction: 2 Gy
Plan Total Fractions Prescribed: 35
Plan Total Prescribed Dose: 70 Gy
Reference Point Dosage Given to Date: 40 Gy
Reference Point Session Dosage Given: 2 Gy
Session Number: 20

## 2023-01-07 LAB — BASIC METABOLIC PANEL - CANCER CENTER ONLY
Anion gap: 4 — ABNORMAL LOW (ref 5–15)
BUN: 18 mg/dL (ref 8–23)
CO2: 33 mmol/L — ABNORMAL HIGH (ref 22–32)
Calcium: 9.6 mg/dL (ref 8.9–10.3)
Chloride: 97 mmol/L — ABNORMAL LOW (ref 98–111)
Creatinine: 0.79 mg/dL (ref 0.61–1.24)
GFR, Estimated: >60 mL/min (ref >60)
Glucose, Bld: 100 mg/dL — ABNORMAL HIGH (ref 70–99)
Potassium: 4.3 mmol/L (ref 3.5–5.1)
Sodium: 134 mmol/L — ABNORMAL LOW (ref 135–145)

## 2023-01-07 LAB — CBC WITH DIFFERENTIAL (CANCER CENTER ONLY)
Abs Immature Granulocytes: 0.01 10*3/uL (ref 0.00–0.07)
Basophils Absolute: 0 10*3/uL (ref 0.0–0.1)
Basophils Relative: 0 %
Eosinophils Absolute: 0 10*3/uL (ref 0.0–0.5)
Eosinophils Relative: 1 %
HCT: 38.6 % — ABNORMAL LOW (ref 39.0–52.0)
Hemoglobin: 13.3 g/dL (ref 13.0–17.0)
Immature Granulocytes: 0 %
Lymphocytes Relative: 9 %
Lymphs Abs: 0.4 10*3/uL — ABNORMAL LOW (ref 0.7–4.0)
MCH: 32 pg (ref 26.0–34.0)
MCHC: 34.5 g/dL (ref 30.0–36.0)
MCV: 92.8 fL (ref 80.0–100.0)
Monocytes Absolute: 1 10*3/uL (ref 0.1–1.0)
Monocytes Relative: 21 %
Neutro Abs: 3.3 10*3/uL (ref 1.7–7.7)
Neutrophils Relative %: 69 %
Platelet Count: 126 10*3/uL — ABNORMAL LOW (ref 150–400)
RBC: 4.16 MIL/uL — ABNORMAL LOW (ref 4.22–5.81)
RDW: 14.2 % (ref 11.5–15.5)
WBC Count: 4.7 10*3/uL (ref 4.0–10.5)
nRBC: 0 % (ref 0.0–0.2)

## 2023-01-07 LAB — MAGNESIUM: Magnesium: 2.1 mg/dL (ref 1.7–2.4)

## 2023-01-07 MED ORDER — HEPARIN SOD (PORK) LOCK FLUSH 100 UNIT/ML IV SOLN: 500.0000 [IU] | Freq: Once | INTRAVENOUS | Status: DC

## 2023-01-07 MED ORDER — SODIUM CHLORIDE 0.9% FLUSH
10.0000 mL | Freq: Once | INTRAVENOUS | Status: AC
  Administered 2023-01-07: 10 mL

## 2023-01-07 MED FILL — Dexamethasone Sodium Phosphate Inj 100 MG/10ML: INTRAMUSCULAR | Qty: 1 | Status: AC

## 2023-01-07 MED FILL — Fosaprepitant Dimeglumine For IV Infusion 150 MG (Base Eq): INTRAVENOUS | Qty: 5 | Status: AC

## 2023-01-07 NOTE — Progress Notes (Signed)
[facility] CONSULT NOTE  Patient Care Team: [name], NP as PCP - General (Family Medicine) [name], [name], RN as Oncology Nurse Navigator [name], MD as Consulting Physician (Hematology and Oncology) [name], MD as Consulting Physician (Radiation Oncology) [name], MD as Consulting Physician (Otolaryngology)  CHIEF COMPLAINTS/PURPOSE OF CONSULTATION:  SCC oropharynx  ASSESSMENT & PLAN:   #1 This is a very pleasant 67 year old healthy male patient with no significant past medical history newly diagnosed with squamous cell carcinoma both sides, locally advanced with ipsilateral and possibly contralateral lymphadenopathy as well presented to medical oncology for recommendations.  Given the invasion of skeletal muscle, he may be actually a T4 N2 M0 staging so far.    PET on 12/14 no evidence of metastatic disease.Large hypermetabolic right-sided oropharyngeal mass consistent with known squamous cell neoplasm. Associated extensive bilateral multistation hypermetabolic lymphadenopathy. We have discussed that for locally advanced HPV positive oropharyngeal cancers, primary treatment would be to recommend concurrent chemoradiation provided there is no evidence of metastatic disease. I have discussed about the regimen, schedule, adverse effects history significant for chest pain ototoxicity, toxicity, peripheral neuropathy.  He understands that some of the side effects can be life-threatening and permanent.  He has now completed 3 cycles of chemotherapy. He now appears to have complete resolution of palpable cervical lymphadenopathy. Ok to proceed with chemo as planned for tomorrow.  #2 cancer related pain, patient has been using hydrocodone once a day.  He is also using baking soda rinses for mouth sores. No change in the pain.  #3  Unintentional weight loss, stable now, using G tube. # CINV, taking anti nausea medication as needed.  #4 Mild  thrombocytopenia, from cisplatin. Ok to continue treatment as planned tomorrow.  Please do not hesitate to contact us with any additional questions or concerns  HISTORY OF PRESENTING ILLNESS:  Alec Gibson 67 y.o. 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  Chronology  Alec Gibson is a 67 year old male patient no significant medical history who first noticed pain in the right side of his jaw as well as his ER about 3 to 4 months ago.  He used to notice it when he suddenly moves his head towards the right side but did not think much of it.  About for the past month and a half he had excruciating episodes of pain.  He describes this pain as it was not similar job and moves to his ear followed by severe pain in the neck.   The pain is so intense that he has to moan at times.   He got some medication from Dr. Sabino Gasser hydrocodone which she has been taking as needed, he does not really need 1 of this again.  Other than this pain, he denies any new complaints.  He is able to swallow everything.  He had CT imaging on 10/30/2022 which showed 3.5 x 2.3 cm solid mass lesion centered in the right Tonsil with effacement of the edges Glossotonsillar sulcus and likely posteromedial extent along the posterior oropharyngeal wall consistent with provided history of oropharyngeal neoplasm.  There is also a 1.4 cm morphologically suspicious right level 1B lymph node and additional suspected pathologic lymphadenopathy at level 2 with short segment occlusion of right internal jugular vein in the mid neck suboptimally evaluated due to streak artifact from dental amalgam. PET scan with no metastatic disease.  C1D1 12/10/2022 C2D1 12/17/2022 Port and PEG tube scheduled for 12/13/2022 C3 delayed by a week to 01/01/2023  Interval History  He feels much better, using the G tube, about 3 cartons a day. His sorethroat is manageable, taking hydrocodone once a day along with viscous lidocaine. No change in hearing. No neuropathy reported.  Urinating well, mostly pale. Rest of the pertinent 10 point ROS reviewed and neg.  Wt Readings from Last 3 Encounters:  01/07/23 136 lb 4.8 oz (61.8 kg)  12/31/22 136 lb 12.8 oz (62.1 kg)  12/30/22 141 lb 1.5 oz (64 kg)     MEDICAL HISTORY:  Past Medical History:  Diagnosis Date   Anxiety    Depression    Thyroid disease     SURGICAL HISTORY: Past Surgical History:  Procedure Laterality Date   IR GASTROSTOMY TUBE MOD SED  12/30/2022   IR IMAGING GUIDED PORT INSERTION  12/30/2022   IR NASO G TUBE PLC W/FL W/RAD  12/30/2022    SOCIAL HISTORY: Social History   Socioeconomic History   Marital status: Divorced    Spouse name: Not on file   Number of children: Not on file   Years of education: Not on file   Highest education level: Not on file  Occupational History   Not on file  Tobacco Use   Smoking status: Never   Smokeless tobacco: Never  Substance and Sexual Activity   Alcohol use: Yes   Drug use: Never   Sexual activity: Not on file  Other Topics Concern   Not on file  Social History Narrative   Not on file   Social Determinants of Health   Financial Resource Strain: Medium Risk (12/12/2022)   Overall Financial Resource Strain (CARDIA)    Difficulty of Paying Living Expenses: Somewhat hard  Food Insecurity: No Food Insecurity (12/12/2022)   Hunger Vital Sign    Worried About Running Out of Food in the Last Year: Never true    Ran Out of Food in the Last Year: Never true  Transportation Needs: No Transportation Needs (12/12/2022)   PRAPARE - Hydrologist (Medical): No    Lack of Transportation (Non-Medical): No  Physical Activity: Not on file  Stress: Not on file  Social Connections: Not on file  Intimate Partner Violence: Not on file    FAMILY HISTORY: No family history on file.  ALLERGIES:  is allergic to sulfa antibiotics.  MEDICATIONS:  Current Outpatient Medications  Medication Sig Dispense Refill   Cholecalciferol  (VITAMIN D-3 PO) Take 1 capsule by mouth in the morning.     clonazePAM (KLONOPIN) 1 MG tablet Take 1 mg by mouth 2 (two) times daily as needed for anxiety (sleep).     dexamethasone (DECADRON) 4 MG tablet Take 2 tablets daily x 3 days starting the day after cisplatin chemotherapy. Take with food. (Patient not taking: Reported on 12/13/2022) 30 tablet 1   HYDROcodone-acetaminophen (NORCO/VICODIN) 5-325 MG tablet Take 1 tablet by mouth every 6 (six) hours as needed for moderate pain. 30 tablet 0   levocetirizine (XYZAL) 5 MG tablet Take 5 mg by mouth in the morning.     levothyroxine (SYNTHROID) 175 MCG tablet Take 1 tablet (175 mcg total) by mouth daily before breakfast. 30 tablet 0   lidocaine (XYLOCAINE) 2 % solution Patient: Mix 1part 2% viscous lidocaine, 1part H20. Swish & swallow 61m of diluted mixture, 357m before meals and at bedtime, up to QID 200 mL 3   lidocaine-prilocaine (EMLA) cream Apply to affected area once (Patient not taking: Reported on 12/13/2022) 30 g 3   Multiple  Vitamin (MULTIVITAMIN) tablet Take 1 tablet by mouth in the morning.     Multiple Vitamins-Minerals (ZINC PO) Take 1 tablet by mouth in the morning.     Omega-3 Fatty Acids (FISH OIL PO) Take 1 capsule by mouth in the morning.     ondansetron (ZOFRAN) 8 MG tablet Take 1 tablet (8 mg total) by mouth every 8 (eight) hours as needed for nausea or vomiting. Start on the third day after cisplatin. 30 tablet 1   prochlorperazine (COMPAZINE) 10 MG tablet Take 1 tablet (10 mg total) by mouth every 6 (six) hours as needed (Nausea or vomiting). (Patient not taking: Reported on 12/13/2022) 30 tablet 1   VITAMIN E PO Take 1 capsule by mouth in the morning.     No current facility-administered medications for this visit.     PHYSICAL EXAMINATION: ECOG PERFORMANCE STATUS: 1 - Symptomatic but completely ambulatory  Vitals:   01/07/23 0837  BP: 104/77  Pulse: 78  Resp: 16  Temp: 97.8 F (36.6 C)  SpO2: 100%    Filed  Weights   01/07/23 0837  Weight: 136 lb 4.8 oz (61.8 kg)    GENERAL:alert, no distress and comfortable Neck: NO palpable cervical LN HEENT: severe mucositis Abdomen: G tube in place. No BLE edema  LABORATORY DATA:  I have reviewed the data as listed Lab Results  Component Value Date   WBC 8.8 12/31/2022   HGB 13.9 12/31/2022   HCT 39.7 12/31/2022   MCV 91.5 12/31/2022   PLT 209 12/31/2022     Chemistry      Component Value Date/Time   NA 135 01/03/2023 0843   K 3.9 01/03/2023 0843   CL 96 (L) 01/03/2023 0843   CO2 33 (H) 01/03/2023 0843   BUN 17 01/03/2023 0843   CREATININE 0.76 01/03/2023 0843      Component Value Date/Time   CALCIUM 9.6 01/03/2023 0843   ALKPHOS 53 01/03/2023 0843   AST 27 01/03/2023 0843   ALT 27 01/03/2023 0843   BILITOT 0.4 01/03/2023 0843       RADIOGRAPHIC STUDIES: I have personally reviewed the radiological images as listed and agreed with the findings in the report. IR Gastrostomy Tube  Result Date: 12/30/2022 INDICATION: Head and neck cancer EXAM: Procedures: 1. FLUOROSCOPIC NASOGASTRIC TUBE PLACEMENT 2. PERCUTANEOUS GASTROSTOMY FEEDING TUBE PLACEMENT COMPARISON:  PET-CT, 11/21/2022. MEDICATIONS: Ancef 2 gm IV; Antibiotics were administered within 1 hour of the procedure. 0.5 mg glucagon IV. CONTRAST:  30 mL of Isovue 300 administered into the gastric lumen. ANESTHESIA/SEDATION: Moderate (conscious) sedation was employed during this procedure. A total of Versed 1 mg and Fentanyl 100 mcg was administered intravenously. Moderate Sedation Time: 21 minutes. The patient's level of consciousness and vital signs were monitored continuously by radiology nursing throughout the procedure under my direct supervision. FLUOROSCOPY TIME:  Fluoroscopic dose; 2 mGy COMPLICATIONS: None immediate. PROCEDURE: Informed written consent was obtained from the patient and/or patient's representative following explanation of the procedure, risks, benefits and  alternatives. A time out was performed prior to the initiation of the procedure. Maximal barrier sterile technique utilized including caps, mask, sterile gowns, sterile gloves, large sterile drape, hand hygiene and Betadine prep. The LEFT upper quadrant was sterilely prepped and draped. A oral gastric catheter was inserted into the stomach under fluoroscopy. The existing nasogastric feeding tube was removed. The left costal margin and barium opacified transverse colon were identified and avoided. Air was injected into the stomach for insufflation and visualization under fluoroscopy. Under  sterile conditions and local anesthesia, 3 T tacks were utilized to pexy the anterior aspect of the stomach against the ventral abdominal wall. Contrast injection confirmed appropriate positioning of each of the T tacks. An incision was made between the T tacks and a 17 gauge trocar needle was utilized to access the stomach. Needle position was confirmed within the stomach with aspiration of air and injection of a small amount of contrast. A stiff Glidewire was advanced into the gastric lumen and under intermittent fluoroscopic guidance, the access needle was exchanged for a Kumpe catheter. With the use of the Kumpe catheter, a stiff Glidewire was advanced into the horizontal segment of the duodenum. Under intermittent fluoroscopic guidance, the Kumpe catheter was exchanged for a telescoping peel-away sheath, ultimately allowing placement of a 18-French balloon retention gastrostomy tube. The retention balloon was insufflated with a mixture of dilute saline and contrast and pulled taut against the anterior wall of the stomach. The external disc was cinched. Contrast injection confirms positioning within the stomach. Several spot radiographic images were obtained in various obliquities for documentation. T he patient tolerated procedure well without immediate post procedural complication. FINDINGS: After successful fluoroscopic  guided placement, the gastrostomy tube is appropriately positioned with internal retention balloon against the ventral aspect of the gastric lumen. IMPRESSION: Successful percutaneous placement of a 16 Fr balloon retention gastrostomy tube. PLAN: *G-tube to gravity drainage bag x 24 hrs. Liquid diet x 24 hrs *OK to cap G-tube (during 24 hr period listed above) for 2 hours post liquid meal. *OK for meds per tube 6 hrs post procedure. *OK to begin TFs and G-tube use in 24 hrs, taper from trickle to goal as tolerated. *Pt is to return to [facility] for routine G-tube exchange in 6 months. Michaelle Birks, MD [facility] Radiology Electronically Signed   By: Michaelle Birks M.D.   On: 12/30/2022 17:56   IR [facility] Dys Tube Plc W/FL W/Rad  Result Date: 12/30/2022 INDICATION: Head and neck cancer EXAM: Procedures: 1. FLUOROSCOPIC NASOGASTRIC TUBE PLACEMENT 2. PERCUTANEOUS GASTROSTOMY FEEDING TUBE PLACEMENT COMPARISON:  PET-CT, 11/21/2022. MEDICATIONS: Ancef 2 gm IV; Antibiotics were administered within 1 hour of the procedure. 0.5 mg glucagon IV. CONTRAST:  30 mL of Isovue 300 administered into the gastric lumen. ANESTHESIA/SEDATION: Moderate (conscious) sedation was employed during this procedure. A total of Versed 1 mg and Fentanyl 100 mcg was administered intravenously. Moderate Sedation Time: 21 minutes. The patient's level of consciousness and vital signs were monitored continuously by radiology nursing throughout the procedure under my direct supervision. FLUOROSCOPY TIME:  Fluoroscopic dose; 2 mGy COMPLICATIONS: None immediate. PROCEDURE: Informed written consent was obtained from the patient and/or patient's representative following explanation of the procedure, risks, benefits and alternatives. A time out was performed prior to the initiation of the procedure. Maximal barrier sterile technique utilized including caps, mask, sterile gowns, sterile gloves, large sterile drape, hand  hygiene and Betadine prep. The LEFT upper quadrant was sterilely prepped and draped. A oral gastric catheter was inserted into the stomach under fluoroscopy. The existing nasogastric feeding tube was removed. The left costal margin and barium opacified transverse colon were identified and avoided. Air was injected into the stomach for insufflation and visualization under fluoroscopy. Under sterile conditions and local anesthesia, 3 T tacks were utilized to pexy the anterior aspect of the stomach against the ventral abdominal wall. Contrast injection confirmed appropriate positioning of each of the T tacks. An incision was made between the T tacks and a  70 gauge trocar needle was utilized to access the stomach. Needle position was confirmed within the stomach with aspiration of air and injection of a small amount of contrast. A stiff Glidewire was advanced into the gastric lumen and under intermittent fluoroscopic guidance, the access needle was exchanged for a Kumpe catheter. With the use of the Kumpe catheter, a stiff Glidewire was advanced into the horizontal segment of the duodenum. Under intermittent fluoroscopic guidance, the Kumpe catheter was exchanged for a telescoping peel-away sheath, ultimately allowing placement of a 18-French balloon retention gastrostomy tube. The retention balloon was insufflated with a mixture of dilute saline and contrast and pulled taut against the anterior wall of the stomach. The external disc was cinched. Contrast injection confirms positioning within the stomach. Several spot radiographic images were obtained in various obliquities for documentation. T he patient tolerated procedure well without immediate post procedural complication. FINDINGS: After successful fluoroscopic guided placement, the gastrostomy tube is appropriately positioned with internal retention balloon against the ventral aspect of the gastric lumen. IMPRESSION: Successful percutaneous placement of a 16 Fr  balloon retention gastrostomy tube. PLAN: *G-tube to gravity drainage bag x 24 hrs. Liquid diet x 24 hrs *OK to cap G-tube (during 24 hr period listed above) for 2 hours post liquid meal. *OK for meds per tube 6 hrs post procedure. *OK to begin TFs and G-tube use in 24 hrs, taper from trickle to goal as tolerated. *Pt is to return to [facility] for routine G-tube exchange in 6 months. Michaelle Birks, MD [facility] Radiology Electronically Signed   By: Michaelle Birks M.D.   On: 12/30/2022 17:56   IR IMAGING GUIDED PORT INSERTION  Result Date: 12/30/2022 INDICATION: Head and neck cancer.  Chemotherapy recommended. EXAM: IMPLANTED PORT A CATH PLACEMENT WITH ULTRASOUND AND FLUOROSCOPIC GUIDANCE MEDICATIONS: None ANESTHESIA/SEDATION: Moderate (conscious) sedation was employed during this procedure. A total of Versed 2 mg and Fentanyl 100 mcg was administered intravenously. Moderate Sedation Time: 22 minutes. The patient's level of consciousness and vital signs were monitored continuously by radiology nursing throughout the procedure under my direct supervision. FLUOROSCOPY TIME:  Fluoroscopic dose; 0 mGy COMPLICATIONS: None immediate. PROCEDURE: The procedure, risks, benefits, and alternatives were explained to the patient. Questions regarding the procedure were encouraged and answered. The patient understands and consents to the procedure. The RIGHT neck and chest were prepped with chlorhexidine in a sterile fashion, and a sterile drape was applied covering the operative field. Maximum barrier sterile technique with sterile gowns and gloves were used for the procedure. A timeout was performed prior to the initiation of the procedure. Local anesthesia was provided with 1% lidocaine with epinephrine. After creating a small venotomy incision, a micropuncture kit was utilized to access the internal jugular vein under direct, real-time ultrasound guidance. Ultrasound image  documentation was performed. The microwire was kinked to measure appropriate catheter length. A subcutaneous port pocket was then created along the upper chest wall utilizing a combination of sharp and blunt dissection. The pocket was irrigated with sterile saline. A single lumen ISP power injectable port was chosen for placement. The 8 Fr catheter was tunneled from the port pocket site to the venotomy incision. The port was placed in the pocket. The external catheter was trimmed to appropriate length. At the venotomy, an 8 Fr peel-away sheath was placed over a guidewire under fluoroscopic guidance. The catheter was then placed through the sheath and the sheath was removed. Final catheter positioning was confirmed and documented with a  fluoroscopic spot radiograph. The port was accessed with a Huber needle, aspirated and flushed with heparinized saline. The port pocket incision was closed with interrupted 3-0 Vicryl suture then Dermabond was applied, including at the venotomy incision. Dressings were placed. The patient tolerated the procedure well without immediate post procedural complication. IMPRESSION: Successful placement of a RIGHT internal jugular approach power injectable Port-A-Cath. The tip of the catheter is positioned at the superior cavo-atrial junction. The catheter is ready for immediate use. Michaelle Birks, MD [facility] Radiology Electronically Signed   By: Michaelle Birks M.D.   On: 12/30/2022 13:47   ECHOCARDIOGRAM COMPLETE  Result Date: 12/13/2022    ECHOCARDIOGRAM REPORT   Patient Name:   Alec Gibson Date of Exam: 12/13/2022 Medical Rec #:  [id number]   Height:       69.0 in Accession #:    [id number]  Weight:       152.2 lb Date of Birth:  September 07, 1956    BSA:          1.840 m Patient Age:    32 years    BP:           96/65 mmHg Patient Gender: M           HR:           67 bpm. Exam Location:  Inpatient Procedure: 2D Echo, Cardiac Doppler and Color  Doppler Indications:   Syncope R55  History:       Patient has no prior history of Echocardiogram examinations.                Cancer.  Sonographer:   Ronny Flurry Referring      [place]: IMPRESSIONS  1. Left ventricular ejection fraction, by estimation, is 60 to 65%. The left ventricle has normal function. The left ventricle has no regional wall motion abnormalities. Left ventricular diastolic parameters are indeterminate.  2. Right ventricular systolic function is normal. The right ventricular size is normal.  3. No evidence of mitral valve regurgitation.  4. The aortic valve is tricuspid. Aortic valve regurgitation is not visualized.  5. The inferior vena cava is normal in size with greater than 50% respiratory variability, suggesting right atrial pressure of 3 mmHg. Comparison(s): No prior Echocardiogram. FINDINGS  Left Ventricle: Left ventricular ejection fraction, by estimation, is 60 to 65%. The left ventricle has normal function. The left ventricle has no regional wall motion abnormalities. The left ventricular internal cavity size was normal in size. There is  no left ventricular hypertrophy. Left ventricular diastolic parameters are indeterminate. Right Ventricle: The right ventricular size is normal. Right ventricular systolic function is normal. Left Atrium: Left atrial size was normal in size. Right Atrium: Right atrial size was normal in size. Pericardium: There is no evidence of pericardial effusion. Mitral Valve: No evidence of mitral valve regurgitation. Tricuspid Valve: Tricuspid valve regurgitation is not demonstrated. Aortic Valve: The aortic valve is tricuspid. Aortic valve regurgitation is not visualized. Aortic valve mean gradient measures 2.0 mmHg. Aortic valve peak gradient measures 3.8 mmHg. Aortic valve area, by VTI measures 3.09 cm. Pulmonic Valve: Pulmonic valve regurgitation is not visualized. Aorta: The aortic root and ascending aorta are structurally normal, with  no evidence of dilitation. Venous: The inferior vena cava is normal in size with greater than 50% respiratory variability, suggesting right atrial pressure of 3 mmHg. IAS/Shunts: No atrial level shunt detected by color flow Doppler.  LEFT VENTRICLE PLAX 2D LVIDd:  4.00 cm   Diastology LVIDs:         3.00 cm   LV e' medial:    8.05 cm/s LV PW:         0.90 cm   LV E/e' medial:  10.4 LV IVS:        0.70 cm   LV e' lateral:   9.14 cm/s LVOT diam:     2.00 cm   LV E/e' lateral: 9.1 LV SV:         62 LV SV Index:   33 LVOT Area:     3.14 cm  RIGHT VENTRICLE             IVC RV S prime:     13.80 cm/s  IVC diam: 1.50 cm TAPSE (M-mode): 2.2 cm LEFT ATRIUM           Index        RIGHT ATRIUM           Index LA diam:      3.10 cm 1.68 cm/m   RA Area:     14.30 cm LA Vol (A2C): 15.3 ml 8.32 ml/m   RA Volume:   36.30 ml  19.73 ml/m LA Vol (A4C): 30.6 ml 16.63 ml/m  AORTIC VALVE AV Area (Vmax):    2.98 cm AV Area (Vmean):   2.87 cm AV Area (VTI):     3.09 cm AV Vmax:           97.70 cm/s AV Vmean:          63.400 cm/s AV VTI:            0.199 m AV Peak Grad:      3.8 mmHg AV Mean Grad:      2.0 mmHg LVOT Vmax:         92.63 cm/s LVOT Vmean:        57.867 cm/s LVOT VTI:          0.196 m LVOT/AV VTI ratio: 0.98  AORTA Ao Root diam: 3.40 cm Ao Asc diam:  3.40 cm MITRAL VALVE MV Area (PHT): 3.60 cm    SHUNTS MV Decel Time: 211 msec    Systemic VTI:  0.20 m MV E velocity: 83.40 cm/s  Systemic Diam: 2.00 cm MV A velocity: 80.00 cm/s MV E/A ratio:  1.04 Mary Scientist, physiological signed by Phineas Inches Signature Date/Time: 12/13/2022/3:09:21 PM    Final     All questions were answered. The patient knows to call the clinic with any problems, questions or concerns. I spent 30 minutes in the care of this patient including H and P, review of records, counseling and coordination of care.     [name], MD 01/07/2023 8:39 AM

## 2023-01-08 ENCOUNTER — Inpatient Hospital Stay: Payer: Medicare Other | Admitting: Dietician

## 2023-01-08 ENCOUNTER — Other Ambulatory Visit: Payer: Self-pay

## 2023-01-08 ENCOUNTER — Inpatient Hospital Stay: Payer: Medicare Other

## 2023-01-08 ENCOUNTER — Ambulatory Visit
Admission: RE | Admit: 2023-01-08 | Discharge: 2023-01-08 | Disposition: A | Discharge status: Home / Self Care | Payer: Medicare Other | Source: Ambulatory Visit | Attending: Radiation Oncology | Admitting: Radiation Oncology

## 2023-01-08 VITALS — BP 104/66 | HR 74 | Temp 98.6°F | Resp 16

## 2023-01-08 DIAGNOSIS — Z510 Encounter for antineoplastic radiation therapy: Secondary | ICD-10-CM | POA: Diagnosis not present

## 2023-01-08 DIAGNOSIS — C090 Malignant neoplasm of tonsillar fossa: Secondary | ICD-10-CM | POA: Diagnosis not present

## 2023-01-08 DIAGNOSIS — C109 Malignant neoplasm of oropharynx, unspecified: Secondary | ICD-10-CM

## 2023-01-08 DIAGNOSIS — Z5111 Encounter for antineoplastic chemotherapy: Secondary | ICD-10-CM | POA: Diagnosis not present

## 2023-01-08 LAB — RAD ONC ARIA SESSION SUMMARY
Course Elapsed Days: 29
Plan Fractions Treated to Date: 21
Plan Prescribed Dose Per Fraction: 2 Gy
Plan Total Fractions Prescribed: 35
Plan Total Prescribed Dose: 70 Gy
Reference Point Dosage Given to Date: 42 Gy
Reference Point Session Dosage Given: 2 Gy
Session Number: 21

## 2023-01-08 MED ORDER — POTASSIUM CHLORIDE IN NACL 20-0.9 MEQ/L-% IV SOLN
Freq: Once | INTRAVENOUS | Status: AC
  Filled 2023-01-08: qty 1000

## 2023-01-08 MED ORDER — SODIUM CHLORIDE 0.9% FLUSH
10.0000 mL | INTRAVENOUS | Status: DC | PRN
  Administered 2023-01-08: 10 mL

## 2023-01-08 MED ORDER — SODIUM CHLORIDE 0.9 % IV SOLN
40.0000 mg/m2 | Freq: Once | INTRAVENOUS | Status: AC
  Administered 2023-01-08: 74 mg via INTRAVENOUS
  Filled 2023-01-08: qty 74

## 2023-01-08 MED ORDER — SODIUM CHLORIDE 0.9 % IV SOLN: Freq: Once | INTRAVENOUS | Status: AC

## 2023-01-08 MED ORDER — MAGNESIUM SULFATE 2 GM/50ML IV SOLN
2.0000 g | Freq: Once | INTRAVENOUS | Status: AC
  Administered 2023-01-08: 2 g via INTRAVENOUS
  Filled 2023-01-08: qty 50

## 2023-01-08 MED ORDER — PALONOSETRON HCL INJECTION 0.25 MG/5ML
0.2500 mg | Freq: Once | INTRAVENOUS | Status: AC
  Administered 2023-01-08: 0.25 mg via INTRAVENOUS
  Filled 2023-01-08: qty 5

## 2023-01-08 MED ORDER — HEPARIN SOD (PORK) LOCK FLUSH 100 UNIT/ML IV SOLN
500.0000 [IU] | Freq: Once | INTRAVENOUS | Status: AC | PRN
  Administered 2023-01-08: 500 [IU]

## 2023-01-08 MED ORDER — SODIUM CHLORIDE 0.9 % IV SOLN
10.0000 mg | Freq: Once | INTRAVENOUS | Status: AC
  Administered 2023-01-08: 10 mg via INTRAVENOUS
  Filled 2023-01-08: qty 10

## 2023-01-08 MED ORDER — SODIUM CHLORIDE 0.9 % IV SOLN
150.0000 mg | Freq: Once | INTRAVENOUS | Status: AC
  Administered 2023-01-08: 150 mg via INTRAVENOUS
  Filled 2023-01-08: qty 150

## 2023-01-08 NOTE — Patient Instructions (Signed)
[facility]  Discharge Instructions: Thank you for choosing [facility] to provide your oncology and hematology care.   If you have a lab appointment with the Cancer Center, please go directly to the Cancer Center and check in at the registration area.   Wear comfortable clothing and clothing appropriate for easy access to any Portacath or PICC line.   We strive to give you quality time with your provider. You may need to reschedule your appointment if you arrive late (15 or more minutes).  Arriving late affects you and other patients whose appointments are after yours.  Also, if you miss three or more appointments without notifying the office, you may be dismissed from the clinic at the provider's discretion.      For prescription refill requests, have your pharmacy contact our office and allow 72 hours for refills to be completed.    Today you received the following chemotherapy and/or immunotherapy agents Cisplatin      To help prevent nausea and vomiting after your treatment, we encourage you to take your nausea medication as directed.  BELOW ARE SYMPTOMS THAT SHOULD BE REPORTED IMMEDIATELY: *FEVER GREATER THAN 100.4 F (38 C) OR HIGHER *CHILLS OR SWEATING *NAUSEA AND VOMITING THAT IS NOT CONTROLLED WITH YOUR NAUSEA MEDICATION *UNUSUAL SHORTNESS OF BREATH *UNUSUAL BRUISING OR BLEEDING *URINARY PROBLEMS (pain or burning when urinating, or frequent urination) *BOWEL PROBLEMS (unusual diarrhea, constipation, pain near the anus) TENDERNESS IN MOUTH AND THROAT WITH OR WITHOUT PRESENCE OF ULCERS (sore throat, sores in mouth, or a toothache) UNUSUAL RASH, SWELLING OR PAIN  UNUSUAL VAGINAL DISCHARGE OR ITCHING   Items with * indicate a potential emergency and should be followed up as soon as possible or go to the Emergency Department if any problems should occur.  Please show the CHEMOTHERAPY ALERT CARD or IMMUNOTHERAPY ALERT CARD at  check-in to the Emergency Department and triage nurse.  Should you have questions after your visit or need to cancel or reschedule your appointment, please contact [facility]  Dept: [phone]  and follow the prompts.  Office hours are 8:00 a.m. to 4:30 p.m. Monday - Friday. Please note that voicemails left after 4:00 p.m. may not be returned until the following business day.  We are closed weekends and major holidays. You have access to a nurse at all times for urgent questions. Please call the main number to the clinic Dept: [phone] and follow the prompts.   For any non-urgent questions, you may also contact your provider using MyChart. We now offer e-Visits for anyone 18 and older to request care online for non-urgent symptoms. For details visit [email or web address].   Also download the MyChart app! Go to the app store, search "MyChart", open the app, select [facility], and log in with your MyChart username and password.   

## 2023-01-08 NOTE — Progress Notes (Signed)
Pt had 75 cc urine output during pre-hydration period.  Per Dr. Chryl Heck, [place] to proceed with treatment.  VO for 500 cc bolus before or after treatment received.  Angie Fava, RN

## 2023-01-09 ENCOUNTER — Ambulatory Visit
Admission: RE | Admit: 2023-01-09 | Discharge: 2023-01-09 | Disposition: A | Discharge status: Home / Self Care | Payer: Medicare Other | Source: Ambulatory Visit | Attending: Radiation Oncology | Admitting: Radiation Oncology

## 2023-01-09 ENCOUNTER — Other Ambulatory Visit: Payer: Self-pay

## 2023-01-09 ENCOUNTER — Encounter (HOSPITAL_COMMUNITY): Payer: Self-pay | Admitting: Radiology

## 2023-01-09 ENCOUNTER — Ambulatory Visit: Payer: Medicare Other | Attending: Radiation Oncology

## 2023-01-09 ENCOUNTER — Other Ambulatory Visit: Payer: Self-pay | Admitting: Hematology and Oncology

## 2023-01-09 DIAGNOSIS — R1310 Dysphagia, unspecified: Secondary | ICD-10-CM | POA: Insufficient documentation

## 2023-01-09 DIAGNOSIS — Z79899 Other long term (current) drug therapy: Secondary | ICD-10-CM | POA: Insufficient documentation

## 2023-01-09 DIAGNOSIS — C090 Malignant neoplasm of tonsillar fossa: Secondary | ICD-10-CM | POA: Diagnosis not present

## 2023-01-09 DIAGNOSIS — Z510 Encounter for antineoplastic radiation therapy: Secondary | ICD-10-CM | POA: Diagnosis not present

## 2023-01-09 DIAGNOSIS — C109 Malignant neoplasm of oropharynx, unspecified: Secondary | ICD-10-CM

## 2023-01-09 LAB — RAD ONC ARIA SESSION SUMMARY
Course Elapsed Days: 30
Plan Fractions Treated to Date: 22
Plan Prescribed Dose Per Fraction: 2 Gy
Plan Total Fractions Prescribed: 35
Plan Total Prescribed Dose: 70 Gy
Reference Point Dosage Given to Date: 44 Gy
Reference Point Session Dosage Given: 2 Gy
Session Number: 22

## 2023-01-09 NOTE — Therapy (Signed)
OUTPATIENT SPEECH LANGUAGE PATHOLOGY ONCOLOGY TREATMENT   Patient Name: Alec Gibson MRN: [id number] DOB:1955/12/11, 67 y.o., male Today's Date: 01/09/2023  PCP: None in Chart REFERRING PROVIDER: [name], MD  END OF SESSION:  End of Session - 01/09/23 0907     Visit Number 2    Number of Visits 4    Date for SLP Re-Evaluation 03/12/23    SLP Start Time 0808    SLP Stop Time  0838    SLP Time Calculation (min) 30 min    Activity Tolerance Patient limited by pain              Past Medical History:  Diagnosis Date   Anxiety    Depression    Thyroid disease    Past Surgical History:  Procedure Laterality Date   IR GASTROSTOMY TUBE MOD SED  12/30/2022   IR IMAGING GUIDED PORT INSERTION  12/30/2022   IR NASO G TUBE PLC W/FL W/RAD  12/30/2022   Patient Active Problem List   Diagnosis Date Noted   Port-A-Cath in place 01/03/2023   Syncope 12/13/2022   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  Rationale for Evaluation and Treatment: Rehabilitation  SUBJECTIVE:   SUBJECTIVE STATEMENT: "I have this saliva - it's very thick." Pt accompanied by: self  PERTINENT HISTORY:  SCC of the Right Tonsil, stage III (T4N2M0, p 16 +) He presented to his PCP on 10/23/22 with stabbing right ear pain for one month and a swollen lymph node to the right side of his neck present for several months. He also reported that the ear pain radiated to beneath his jaw.  He was urgently referred to ENT. 10/23/22 (same day as PCP visit) He saw Dr. Sabino Gasser who performed a flexible laryngoscopy which revealed at firm tumor based on the right palatine tonsil with extension through the soft palate into the right eustachian tube. 10/30/22 CT neck was completed revealing  a solid mass lesion centered in the right palatine tonsil measuring 3.5 x 2.3 cm, with effacement of the adjacent parapharyngeal fat, right glossotonsillar sulcus, and likely posteromedial extent along the posterior oropharyngeal wall. CT also showed a morphologically suspicious right level IB lymph node measuring 1.4 cm, and additional suspected level II pathologic lymphadenopathy with short-segment occlusion of the right internal jugular vein in the mid neck. 11/07/22 Biopsy of the right oropharyngeal mass showed high-grade invasive SCC, p 16 +.11/21/22 PET shows a large right oropharyngeal mass and bilateral multistation hypermetabolic lymphadenopathy, no distant metastatic disease. He will receive chemotherapy/radiation. Treatment plan:  He will receive 35 fractions of radiation to his oropharynx and bilateral neck with weekly cisplatin. He started on 12/10/22 and will complete 01/27/23. Pretreatment procedures -PEG/PAC placed 12/13/22  PAIN:  Are you having pain? Yes: NPRS scale: 710 (when swallowing) Pain location: throat Pain description: soreness Aggravating factors: swallowing Alleviating: Lidocaine  F PATIENT GOALS: Maintain WNL swallowing  OBJECTIVE:    TODAY'S TREATMENT:  DATE:  01/09/23: Pt is using PEG solely, but having some H2O each day. SLP congratulated pt on this. Thickened saliva and mucositis/mouth sores make swallowing and completing HEP more challenging. SLP encouraged pt to cycle through the exercises at this time and do as many cycles as he can, then incr  reps as pt is able to do so after chemo/rad tx ends.  Today, pt reported completing HEP at suboptimal frequency and had to look at the HEP to read directions for each exercise prior to completing reps. He req'd occasional min A for procedure. He told SLP rationale for completion with independence. With water sips during  HEP pt reported nasal regurgitation 2/10 sips. SLP encouraged pt to make change to sip size and postural changes with neck to mitigate this. Pt did NOT report this occurring with sips of water throughout the day. SLP postulates that regurgitation occurs due to procedure from HEP.  12/12/22 (eval): Research states the risk for dysphagia increases due to radiation and/or chemotherapy treatment due to a variety of factors, so SLP educated the pt about the possibility of reduced/limited ability for PO intake during rad tx. SLP also educated pt regarding possible changes to swallowing musculature after rad tx, and why adherence to dysphagia HEP provided today and PO consumption was necessary to inhibit muscle fibrosis following rad tx and to mitigate muscle disuse atrophy. SLP informed pt why this would be detrimental to their swallowing status and to their pulmonary health. Pt demonstrated understanding of these things to SLP. SLP encouraged pt to safely eat and drink as deep into their radiation/chemotherapy as possible to provide the best possible long-term swallowing outcome for pt.  SLP then developed an individualized HEP for pt involving oral and pharyngeal strengthening and ROM and pt was instructed how to perform these exercises, including SLP demonstration. After SLP demonstration, pt return demonstrated each exercise. SLP ensured pt performance was correct prior to educating pt on next exercise. Pt required occasional min-mod cues faded to modified independent to perform HEP. Pt was instructed to complete this program at least 6 days/week, at least 2 times a day until 6 months after his or  her last day of rad tx and then x2 a week after that, indefinitely. Among other modifications for days when pt cannot functionally swallow, SLP also suggested pt  cycle through the swallowing portion so the full program of exercises can be completed instead of fatiguing on one of the swallowing exercises and being unable to perform the other swallowing exercises. SLP instructed that swallowing exercise reps should then be increased back into the regimen as pt is able to do so. Secondly, pt was told that former patients have told SLP that during their course of radiation therapy, taking prescribed pain medication just prior to performing HEP (and eating/drinking) has proven helpful in completing HEP (and eating and drinking) more regularly when going through their course of radiation treatment.    PATIENT EDUCATION: Education details: late effects head/neck radiation on swallow function, HEP procedure, and modification to HEP when difficulty experienced with swallowing during and after radiation course Person educated: Patient Education method: Explanation, Demonstration, Verbal cues, and Handouts Education comprehension: verbalized understanding, returned demonstration, verbal cues required, and needs further education   ASSESSMENT:  CLINICAL IMPRESSION: Patient is a 67 y.o. male who was seen today for treatment of swallowing as they undergo radiation/chemoradiation therapy. Today pt drank thin liquids without overt s/s oral or pharyngeal difficulty. At this time pt swallowing is deemed WNL/WFL  with these POs. No oral or overt s/sx pharyngeal deficits, including aspiration were observed. There are no overt s/s aspiration PNA observed by SLP nor any reported by pt at this time. Data indicate that pt's swallow ability will likely decrease over the course of radiation/chemoradiation therapy and could very well decline over time following the conclusion of that therapy due to muscle disuse atrophy and/or  muscle fibrosis. Pt will cont to need to be seen by SLP in order to assess safety of PO intake, assess the need for recommending any objective swallow assessment, and ensuring pt is correctly completing the individualized HEP.  OBJECTIVE IMPAIRMENTS: include dysphagia. These impairments are limiting patient from safety when swallowing. Factors affecting potential to achieve goals and functional outcome are  none noted today . Patient will benefit from skilled SLP services to address above impairments and improve overall function.  REHAB POTENTIAL: Good   GOALS: Goals reviewed with patient? Yes  SHORT TERM GOALS: Target: 3rd total session   Pt will compelte HEP with modified independence in 2 sessions Baseline: Goal status: Ongoing   2.  pt will tell SLP why pt is completing HEP with modified independence Baseline:  Goal status: Met   3.  pt will describe 3 overt s/s aspiration PNA with modified independence Baseline:  Goal status: Ongoing   4.  pt will tell SLP how a food journal could hasten return to a more normalized diet Baseline:  Goal status: Ongoing     LONG TERM GOALS: Target: 7th total session   1.  pt will complete HEP with independence over two visits Baseline:  Goal status: Ongoing   2.  pt will describe how to modify HEP over time, and the timeline associated with reduction in HEP frequency with modified independence over two sessions Baseline:  Goal status: Ongoing  PLAN:  SLP FREQUENCY:  once approx every 4 weeks  SLP DURATION:  6 sessions  PLANNED INTERVENTIONS: Aspiration precaution training, Pharyngeal strengthening exercises, Diet toleration management , Environmental controls, Trials of upgraded texture/liquids, Cueing hierachy, Internal/external aids, SLP instruction and feedback, Compensatory strategies, and Patient/family education    [facility], [place] 01/09/2023, 9:11 AM

## 2023-01-10 ENCOUNTER — Ambulatory Visit
Admission: RE | Admit: 2023-01-10 | Discharge: 2023-01-10 | Disposition: A | Discharge status: Home / Self Care | Payer: Medicare Other | Source: Ambulatory Visit | Attending: Radiation Oncology | Admitting: Radiation Oncology

## 2023-01-10 ENCOUNTER — Other Ambulatory Visit (HOSPITAL_COMMUNITY): Payer: Medicare Other

## 2023-01-10 ENCOUNTER — Inpatient Hospital Stay: Payer: Medicare Other | Admitting: Dietician

## 2023-01-10 ENCOUNTER — Other Ambulatory Visit: Payer: Self-pay

## 2023-01-10 ENCOUNTER — Inpatient Hospital Stay: Payer: Medicare Other | Attending: Hematology and Oncology

## 2023-01-10 DIAGNOSIS — D61810 Antineoplastic chemotherapy induced pancytopenia: Secondary | ICD-10-CM | POA: Diagnosis not present

## 2023-01-10 DIAGNOSIS — Z79891 Long term (current) use of opiate analgesic: Secondary | ICD-10-CM | POA: Insufficient documentation

## 2023-01-10 DIAGNOSIS — C090 Malignant neoplasm of tonsillar fossa: Secondary | ICD-10-CM | POA: Diagnosis not present

## 2023-01-10 DIAGNOSIS — T451X5A Adverse effect of antineoplastic and immunosuppressive drugs, initial encounter: Secondary | ICD-10-CM | POA: Diagnosis not present

## 2023-01-10 DIAGNOSIS — Z510 Encounter for antineoplastic radiation therapy: Secondary | ICD-10-CM | POA: Diagnosis not present

## 2023-01-10 DIAGNOSIS — G893 Neoplasm related pain (acute) (chronic): Secondary | ICD-10-CM | POA: Insufficient documentation

## 2023-01-10 DIAGNOSIS — Z931 Gastrostomy status: Secondary | ICD-10-CM | POA: Insufficient documentation

## 2023-01-10 DIAGNOSIS — Z95828 Presence of other vascular implants and grafts: Secondary | ICD-10-CM

## 2023-01-10 DIAGNOSIS — C109 Malignant neoplasm of oropharynx, unspecified: Secondary | ICD-10-CM

## 2023-01-10 DIAGNOSIS — Z5111 Encounter for antineoplastic chemotherapy: Secondary | ICD-10-CM | POA: Diagnosis present

## 2023-01-10 DIAGNOSIS — R634 Abnormal weight loss: Secondary | ICD-10-CM

## 2023-01-10 LAB — RAD ONC ARIA SESSION SUMMARY
Course Elapsed Days: 31
Plan Fractions Treated to Date: 23
Plan Prescribed Dose Per Fraction: 2 Gy
Plan Total Fractions Prescribed: 35
Plan Total Prescribed Dose: 70 Gy
Reference Point Dosage Given to Date: 46 Gy
Reference Point Session Dosage Given: 2 Gy
Session Number: 23

## 2023-01-10 LAB — CMP (CANCER CENTER ONLY)
ALT: 37 U/L (ref 0–44)
AST: 26 U/L (ref 15–41)
Albumin: 3.6 g/dL (ref 3.5–5.0)
Alkaline Phosphatase: 49 U/L (ref 38–126)
Anion gap: 5 (ref 5–15)
BUN: 24 mg/dL — ABNORMAL HIGH (ref 8–23)
CO2: 31 mmol/L (ref 22–32)
Calcium: 9.1 mg/dL (ref 8.9–10.3)
Chloride: 99 mmol/L (ref 98–111)
Creatinine: 0.74 mg/dL (ref 0.61–1.24)
GFR, Estimated: >60 mL/min (ref >60)
Glucose, Bld: 101 mg/dL — ABNORMAL HIGH (ref 70–99)
Potassium: 4.1 mmol/L (ref 3.5–5.1)
Sodium: 135 mmol/L (ref 135–145)
Total Bilirubin: 0.4 mg/dL (ref 0.3–1.2)
Total Protein: 6 g/dL — ABNORMAL LOW (ref 6.5–8.1)

## 2023-01-10 LAB — MAGNESIUM: Magnesium: 2.2 mg/dL (ref 1.7–2.4)

## 2023-01-10 LAB — PHOSPHORUS: Phosphorus: 4 mg/dL (ref 2.5–4.6)

## 2023-01-10 MED ORDER — SODIUM CHLORIDE 0.9% FLUSH
10.0000 mL | Freq: Once | INTRAVENOUS | Status: AC
  Administered 2023-01-10: 10 mL

## 2023-01-10 MED ORDER — HEPARIN SOD (PORK) LOCK FLUSH 100 UNIT/ML IV SOLN
500.0000 [IU] | Freq: Once | INTRAVENOUS | Status: AC
  Administered 2023-01-10: 500 [IU]

## 2023-01-10 NOTE — Progress Notes (Signed)
Nutrition Follow-up:  Patient with SCC of oropharynx. He is receiving concurrent chemoradiation under the care of Dr. Chryl Heck and Dr. Isidore Moos (start 1/3). Chemo held 1/17    Met with patient in office following radiation therapy. He has sore throat. His saliva is thick. Patient is not eating by mouth. Says he is taking sips of water. Patient is relying on tube feeds and progressing towards goal. He reports giving 3 cartons of Osmolite 1.5 with 60 ml water before and after. Patient reports satiety after feedings, but denied abdominal bloating, nausea, vomiting, diarrhea following bolus. Patient reports new onset episodes of diarrhea that started Thursday morning (2/1). Patient states "I got 8 bags of fluids on Wednesday and was here forever." Per chart patient received 2 g magnesium sulfate + NaCl with 20 mEq KCl with treatment. Patient reports 2 episodes of watery diarrhea Thursday morning. He had an episode of diarrhea this morning as well. Patient reports formula and supplies were delivered from [employer]. He received the incorrect syringes in his shipment.    Medications: reviewed   Labs: 1/30 - Na 134, Mg/K/Phos (WNL)  Anthropometrics: Weight 136 lb 4.8 oz stable x 1 week  1/23 - 136 lb 12.8 oz 1/22 - 141 lb 1.5 oz  1/16 - 143 lb 3.2 oz  12/15 - 155 lb 2 oz   Estimated Energy Needs   Kcals: 2180-2545 Protein: 95-116 Fluid: >2.1 L   NUTRITION DIAGNOSIS: Unintended weight loss continues - starting tube feeds     INTERVENTION:  Do not feel diarrhea related to tube feedings given patient tolerating without diarrhea x one week. ? Diarrhea episodes r/t IVF + 2 g Mg given 1/31 Samples of Banatrol TF were provided. Dosing instructions discussed and written down. Teach back method used Encouraged patient to continue working to increase tube feeding to goal as tolerated Reinforced education on importance of meeting water goal [employer] contacted - [employer] syringes to be shipped per rep. Provided patient  with additional syringes today   Goal: 7 cartons Osmolite 1.5 split over four feedings per day (1680 ml/day) Flush tube with 60 ml water before and after each feeding. Drink by mouth or give via tube additional 2 cups (480 ml) water daily. This provides 2485 kcal, 104.3 grams protein, 1240m free water (2227 ml total water with flushes) Meets 100% needs      MONITORING, EVALUATION, GOAL: weight trends, tube feeding, oral intake, labs     NEXT VISIT: Wednesday February 7 during infusion

## 2023-01-10 NOTE — Progress Notes (Signed)
Pt reported continued diarrhea this am (he reports he has about two episodes of diarrhea in the morning especially over the past couple of days). He states he feels like they "overdid it his fluids and chemo recently). Rn educated pt that this could be from a combination of chemo and the tube feeds as well. Rn will report this concern to Dr. Chryl Heck and nutrition on their thoughts on this new development. He also reported that he was sent the wrong peg tube supplies as well. This information was sent to nutrition for follow up.  Overall pt was stable this am with no s/s of distress and was able to ambulate of of clinic without complication.

## 2023-01-13 ENCOUNTER — Other Ambulatory Visit: Payer: Self-pay

## 2023-01-13 ENCOUNTER — Ambulatory Visit
Admission: RE | Admit: 2023-01-13 | Discharge: 2023-01-13 | Disposition: A | Discharge status: Home / Self Care | Payer: Medicare Other | Source: Ambulatory Visit | Attending: Radiation Oncology | Admitting: Radiation Oncology

## 2023-01-13 DIAGNOSIS — Z510 Encounter for antineoplastic radiation therapy: Secondary | ICD-10-CM | POA: Diagnosis not present

## 2023-01-13 DIAGNOSIS — C090 Malignant neoplasm of tonsillar fossa: Secondary | ICD-10-CM | POA: Diagnosis not present

## 2023-01-13 DIAGNOSIS — C109 Malignant neoplasm of oropharynx, unspecified: Secondary | ICD-10-CM | POA: Diagnosis not present

## 2023-01-13 LAB — RAD ONC ARIA SESSION SUMMARY
Course Elapsed Days: 34
Plan Fractions Treated to Date: 24
Plan Prescribed Dose Per Fraction: 2 Gy
Plan Total Fractions Prescribed: 35
Plan Total Prescribed Dose: 70 Gy
Reference Point Dosage Given to Date: 48 Gy
Reference Point Session Dosage Given: 2 Gy
Session Number: 24

## 2023-01-14 ENCOUNTER — Other Ambulatory Visit: Payer: Self-pay

## 2023-01-14 ENCOUNTER — Encounter: Payer: Self-pay | Admitting: Hematology and Oncology

## 2023-01-14 ENCOUNTER — Inpatient Hospital Stay: Payer: Medicare Other

## 2023-01-14 ENCOUNTER — Inpatient Hospital Stay (HOSPITAL_BASED_OUTPATIENT_CLINIC_OR_DEPARTMENT_OTHER): Payer: Medicare Other | Admitting: Hematology and Oncology

## 2023-01-14 ENCOUNTER — Ambulatory Visit
Admission: RE | Admit: 2023-01-14 | Discharge: 2023-01-14 | Disposition: A | Discharge status: Home / Self Care | Payer: Medicare Other | Source: Ambulatory Visit | Attending: Radiation Oncology | Admitting: Radiation Oncology

## 2023-01-14 DIAGNOSIS — Z510 Encounter for antineoplastic radiation therapy: Secondary | ICD-10-CM | POA: Diagnosis not present

## 2023-01-14 DIAGNOSIS — C090 Malignant neoplasm of tonsillar fossa: Secondary | ICD-10-CM

## 2023-01-14 DIAGNOSIS — C109 Malignant neoplasm of oropharynx, unspecified: Secondary | ICD-10-CM | POA: Diagnosis not present

## 2023-01-14 DIAGNOSIS — Z5111 Encounter for antineoplastic chemotherapy: Secondary | ICD-10-CM | POA: Diagnosis not present

## 2023-01-14 DIAGNOSIS — Z95828 Presence of other vascular implants and grafts: Secondary | ICD-10-CM

## 2023-01-14 LAB — BASIC METABOLIC PANEL - CANCER CENTER ONLY
Anion gap: 5 (ref 5–15)
BUN: 19 mg/dL (ref 8–23)
CO2: 31 mmol/L (ref 22–32)
Calcium: 9.5 mg/dL (ref 8.9–10.3)
Chloride: 99 mmol/L (ref 98–111)
Creatinine: 0.74 mg/dL (ref 0.61–1.24)
GFR, Estimated: >60 mL/min (ref >60)
Glucose, Bld: 83 mg/dL (ref 70–99)
Potassium: 4.2 mmol/L (ref 3.5–5.1)
Sodium: 135 mmol/L (ref 135–145)

## 2023-01-14 LAB — CBC WITH DIFFERENTIAL (CANCER CENTER ONLY)
Abs Immature Granulocytes: 0.02 10*3/uL (ref 0.00–0.07)
Basophils Absolute: 0 10*3/uL (ref 0.0–0.1)
Basophils Relative: 0 %
Eosinophils Absolute: 0 10*3/uL (ref 0.0–0.5)
Eosinophils Relative: 0 %
HCT: 35.2 % — ABNORMAL LOW (ref 39.0–52.0)
Hemoglobin: 12.2 g/dL — ABNORMAL LOW (ref 13.0–17.0)
Immature Granulocytes: 0 %
Lymphocytes Relative: 6 %
Lymphs Abs: 0.3 10*3/uL — ABNORMAL LOW (ref 0.7–4.0)
MCH: 32.1 pg (ref 26.0–34.0)
MCHC: 34.7 g/dL (ref 30.0–36.0)
MCV: 92.6 fL (ref 80.0–100.0)
Monocytes Absolute: 0.6 10*3/uL (ref 0.1–1.0)
Monocytes Relative: 13 %
Neutro Abs: 4.1 10*3/uL (ref 1.7–7.7)
Neutrophils Relative %: 81 %
Platelet Count: 117 10*3/uL — ABNORMAL LOW (ref 150–400)
RBC: 3.8 MIL/uL — ABNORMAL LOW (ref 4.22–5.81)
RDW: 14.2 % (ref 11.5–15.5)
WBC Count: 5.1 10*3/uL (ref 4.0–10.5)
nRBC: 0 % (ref 0.0–0.2)

## 2023-01-14 LAB — RAD ONC ARIA SESSION SUMMARY
Course Elapsed Days: 35
Plan Fractions Treated to Date: 25
Plan Prescribed Dose Per Fraction: 2 Gy
Plan Total Fractions Prescribed: 35
Plan Total Prescribed Dose: 70 Gy
Reference Point Dosage Given to Date: 50 Gy
Reference Point Session Dosage Given: 2 Gy
Session Number: 25

## 2023-01-14 LAB — MAGNESIUM: Magnesium: 2.1 mg/dL (ref 1.7–2.4)

## 2023-01-14 MED ORDER — SODIUM CHLORIDE 0.9% FLUSH
10.0000 mL | Freq: Once | INTRAVENOUS | Status: AC
  Administered 2023-01-14: 10 mL

## 2023-01-14 MED FILL — Fosaprepitant Dimeglumine For IV Infusion 150 MG (Base Eq): INTRAVENOUS | Qty: 5 | Status: AC

## 2023-01-14 MED FILL — Dexamethasone Sodium Phosphate Inj 100 MG/10ML: INTRAMUSCULAR | Qty: 1 | Status: AC

## 2023-01-14 NOTE — Progress Notes (Signed)
[facility] CONSULT NOTE  Patient Care Team: [name], NP as PCP - General (Family Medicine) [name], [name], RN as Oncology Nurse Navigator [name], MD as Consulting Physician (Hematology and Oncology) [name], MD as Consulting Physician (Radiation Oncology) [name], MD as Consulting Physician (Otolaryngology)  CHIEF COMPLAINTS/PURPOSE OF CONSULTATION:   SCC oropharynx  ASSESSMENT & PLAN:   #1 This is a very pleasant 67 year old healthy male patient with no significant past medical history newly diagnosed with squamous cell carcinoma both sides, locally advanced with ipsilateral and possibly contralateral lymphadenopathy as well presented to medical oncology for recommendations.  Given the invasion of skeletal muscle, he may be actually a T4 N2 M0 staging so far.    PET on 12/14 no evidence of metastatic disease.Large hypermetabolic right-sided oropharyngeal mass consistent with known squamous cell neoplasm. Associated extensive bilateral multistation hypermetabolic lymphadenopathy. We have discussed that for locally advanced HPV positive oropharyngeal cancers, primary treatment would be to recommend concurrent chemoradiation provided there is no evidence of metastatic disease. I have discussed about the regimen, schedule, adverse effects history significant for chest pain ototoxicity, toxicity, peripheral neuropathy.  He understands that some of the side effects can be life-threatening and permanent.  He has now completed 4 cycles of chemotherapy. He now appears to have complete resolution of palpable cervical lymphadenopathy. Ok to proceed with chemo as planned for tomorrow if labs are all with in parameters.  #2 cancer related pain, patient has been using hydrocodone once a day.  He is also using baking soda rinses for mouth sores. No change in the pain.  #3  Stable weight since he has been using G tube as recommended. # CINV, taking anti  nausea medication as needed.  #4 Mild thrombocytopenia, from cisplatin. Ok to continue treatment as planned tomorrow.  Please do not hesitate to contact us with any additional questions or concerns  HISTORY OF PRESENTING ILLNESS:  Alec Gibson 67 y.o. 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  Chronology  [name] is a 67 year old male patient no significant medical history who first noticed pain in the right side of his jaw as well as his ER about 3 to 4 months ago.  He used to notice it when he suddenly moves his head towards the right side but did not think much of it.  About for the past month and a half he had excruciating episodes of pain.  He describes this pain as it was not similar job and moves to his ear followed by severe pain in the neck.   The pain is so intense that he has to moan at times.   He got some medication from Dr. Sabino Gasser hydrocodone which she has been taking as needed, he does not really need 1 of this again.  Other than this pain, he denies any new complaints.  He is able to swallow everything.  He had CT imaging on 10/30/2022 which showed 3.5 x 2.3 cm solid mass lesion centered in the right Tonsil with effacement of the edges Glossotonsillar sulcus and likely posteromedial extent along the posterior oropharyngeal wall consistent with provided history of oropharyngeal neoplasm.  There is also a 1.4 cm morphologically suspicious right level 1B lymph node and additional suspected pathologic lymphadenopathy at level 2 with short segment occlusion of right internal jugular vein in the mid neck suboptimally evaluated due to streak artifact from dental amalgam. PET scan with no metastatic disease.  C1D1 12/10/2022 C2D1 12/17/2022 Port and PEG tube  scheduled for 12/13/2022 C3 delayed by a week to 01/01/2023  Interval History  Since last visit, he is upto 4 feedings via G tube. Constipation is better. Weight is stable. Pain in the throat is sore, taking hydrocodone as needed, may  be once a day No change in hearing, neuropathy. Urine is pale, no dark color.  Rest of the pertinent 10 point ROS reviewed and neg.  Wt Readings from Last 3 Encounters:  01/14/23 135 lb 1.6 oz (61.3 kg)  01/07/23 136 lb 4.8 oz (61.8 kg)  12/31/22 136 lb 12.8 oz (62.1 kg)     MEDICAL HISTORY:  Past Medical History:  Diagnosis Date   Anxiety    Depression    Thyroid disease     SURGICAL HISTORY: Past Surgical History:  Procedure Laterality Date   IR GASTROSTOMY TUBE MOD SED  12/30/2022   IR IMAGING GUIDED PORT INSERTION  12/30/2022   IR NASO G TUBE PLC W/FL W/RAD  12/30/2022    SOCIAL HISTORY: Social History   Socioeconomic History   Marital status: Divorced    Spouse name: Not on file   Number of children: Not on file   Years of education: Not on file   Highest education level: Not on file  Occupational History   Not on file  Tobacco Use   Smoking status: Never   Smokeless tobacco: Never  Substance and Sexual Activity   Alcohol use: Yes   Drug use: Never   Sexual activity: Not on file  Other Topics Concern   Not on file  Social History Narrative   Not on file   Social Determinants of Health   Financial Resource Strain: Medium Risk (12/12/2022)   Overall Financial Resource Strain (CARDIA)    Difficulty of Paying Living Expenses: Somewhat hard  Food Insecurity: No Food Insecurity (12/12/2022)   Hunger Vital Sign    Worried About Running Out of Food in the Last Year: Never true    Ran Out of Food in the Last Year: Never true  Transportation Needs: No Transportation Needs (12/12/2022)   PRAPARE - Hydrologist (Medical): No    Lack of Transportation (Non-Medical): No  Physical Activity: Not on file  Stress: Not on file  Social Connections: Not on file  Intimate Partner Violence: Not on file    FAMILY HISTORY: No family history on file.  ALLERGIES:  is allergic to sulfa antibiotics.  MEDICATIONS:  Current Outpatient Medications   Medication Sig Dispense Refill   Cholecalciferol (VITAMIN D-3 PO) Take 1 capsule by mouth in the morning.     clonazePAM (KLONOPIN) 1 MG tablet Take 1 mg by mouth 2 (two) times daily as needed for anxiety (sleep).     dexamethasone (DECADRON) 4 MG tablet Take 2 tablets daily x 3 days starting the day after cisplatin chemotherapy. Take with food. (Patient not taking: Reported on 12/13/2022) 30 tablet 1   HYDROcodone-acetaminophen (NORCO/VICODIN) 5-325 MG tablet Take 1 tablet by mouth every 6 (six) hours as needed for moderate pain. 30 tablet 0   levocetirizine (XYZAL) 5 MG tablet Take 5 mg by mouth in the morning.     levothyroxine (SYNTHROID) 175 MCG tablet Take 1 tablet (175 mcg total) by mouth daily before breakfast. 30 tablet 0   lidocaine (XYLOCAINE) 2 % solution Patient: Mix 1part 2% viscous lidocaine, 1part H20. Swish & swallow 13m of diluted mixture, 359m before meals and at bedtime, up to QID 200 mL 3   lidocaine-prilocaine (EMLA)  cream Apply to affected area once (Patient not taking: Reported on 12/13/2022) 30 g 3   Multiple Vitamin (MULTIVITAMIN) tablet Take 1 tablet by mouth in the morning.     Multiple Vitamins-Minerals (ZINC PO) Take 1 tablet by mouth in the morning.     Omega-3 Fatty Acids (FISH OIL PO) Take 1 capsule by mouth in the morning.     ondansetron (ZOFRAN) 8 MG tablet Take 1 tablet (8 mg total) by mouth every 8 (eight) hours as needed for nausea or vomiting. Start on the third day after cisplatin. 30 tablet 1   prochlorperazine (COMPAZINE) 10 MG tablet Take 1 tablet (10 mg total) by mouth every 6 (six) hours as needed (Nausea or vomiting). (Patient not taking: Reported on 12/13/2022) 30 tablet 1   VITAMIN E PO Take 1 capsule by mouth in the morning.     No current facility-administered medications for this visit.     PHYSICAL EXAMINATION: ECOG PERFORMANCE STATUS: 1 - Symptomatic but completely ambulatory  Vitals:   01/14/23 0835  BP: 105/62  Pulse: 74  Resp: 16   Temp: 97.7 F (36.5 C)  SpO2: 98%    Filed Weights   01/14/23 0835  Weight: 135 lb 1.6 oz (61.3 kg)    GENERAL:alert, no distress and comfortable Neck: NO palpable cervical LN HEENT: severe mucositis noted. Abdomen: G tube in place. No BLE edema  LABORATORY DATA:  I have reviewed the data as listed Lab Results  Component Value Date   WBC 5.1 01/14/2023   HGB 12.2 (L) 01/14/2023   HCT 35.2 (L) 01/14/2023   MCV 92.6 01/14/2023   PLT 117 (L) 01/14/2023     Chemistry      Component Value Date/Time   NA 135 01/10/2023 0804   K 4.1 01/10/2023 0804   CL 99 01/10/2023 0804   CO2 31 01/10/2023 0804   BUN 24 (H) 01/10/2023 0804   CREATININE 0.74 01/10/2023 0804      Component Value Date/Time   CALCIUM 9.1 01/10/2023 0804   ALKPHOS 49 01/10/2023 0804   AST 26 01/10/2023 0804   ALT 37 01/10/2023 0804   BILITOT 0.4 01/10/2023 0804       RADIOGRAPHIC STUDIES: I have personally reviewed the radiological images as listed and agreed with the findings in the report. IR Naso G Tube Plc W/FL W/Rad  Result Date: 01/09/2023 INDICATION: Dysphagia EXAM: FLUOROSCOPIC NASOGASTRIC TUBE PLACEMENT COMPARISON:  None Available. CONTRAST:  65m OMNIPAQUE IOHEXOL 300 MG/ML  SOLN FLUOROSCOPY: See prior report COMPLICATIONS: None immediate PROCEDURE: FLUOROSCOPIC NASOGASTRIC TUBE PLACEMENT Please see complete, dedicated report for complete details of procedure. IMPRESSION: Procedural accession order for fluoroscopic NG tube placement during percutaneous gastrostomy insertion on 12/30/2022. Please see complete, dedicated report for complete details of procedure. JMichaelle Birks MD [facility] Electronically Signed   By: JMichaelle BirksM.D.   On: 01/09/2023 11:48   IR Gastrostomy Tube  Result Date: 12/30/2022 INDICATION: Head and neck cancer EXAM: Procedures: 1. FLUOROSCOPIC NASOGASTRIC TUBE PLACEMENT 2. PERCUTANEOUS GASTROSTOMY FEEDING TUBE  PLACEMENT COMPARISON:  PET-CT, 11/21/2022. MEDICATIONS: Ancef 2 gm IV; Antibiotics were administered within 1 hour of the procedure. 0.5 mg glucagon IV. CONTRAST:  30 mL of Isovue 300 administered into the gastric lumen. ANESTHESIA/SEDATION: Moderate (conscious) sedation was employed during this procedure. A total of Versed 1 mg and Fentanyl 100 mcg was administered intravenously. Moderate Sedation Time: 21 minutes. The patient's level of consciousness and vital signs were monitored continuously by  radiology nursing throughout the procedure under my direct supervision. FLUOROSCOPY TIME:  Fluoroscopic dose; 2 mGy COMPLICATIONS: None immediate. PROCEDURE: Informed written consent was obtained from the patient and/or patient's representative following explanation of the procedure, risks, benefits and alternatives. A time out was performed prior to the initiation of the procedure. Maximal barrier sterile technique utilized including caps, mask, sterile gowns, sterile gloves, large sterile drape, hand hygiene and Betadine prep. The LEFT upper quadrant was sterilely prepped and draped. A oral gastric catheter was inserted into the stomach under fluoroscopy. The existing nasogastric feeding tube was removed. The left costal margin and barium opacified transverse colon were identified and avoided. Air was injected into the stomach for insufflation and visualization under fluoroscopy. Under sterile conditions and local anesthesia, 3 T tacks were utilized to pexy the anterior aspect of the stomach against the ventral abdominal wall. Contrast injection confirmed appropriate positioning of each of the T tacks. An incision was made between the T tacks and a 17 gauge trocar needle was utilized to access the stomach. Needle position was confirmed within the stomach with aspiration of air and injection of a small amount of contrast. A stiff Glidewire was advanced into the gastric lumen and under intermittent fluoroscopic guidance,  the access needle was exchanged for a Kumpe catheter. With the use of the Kumpe catheter, a stiff Glidewire was advanced into the horizontal segment of the duodenum. Under intermittent fluoroscopic guidance, the Kumpe catheter was exchanged for a telescoping peel-away sheath, ultimately allowing placement of a 18-French balloon retention gastrostomy tube. The retention balloon was insufflated with a mixture of dilute saline and contrast and pulled taut against the anterior wall of the stomach. The external disc was cinched. Contrast injection confirms positioning within the stomach. Several spot radiographic images were obtained in various obliquities for documentation. T he patient tolerated procedure well without immediate post procedural complication. FINDINGS: After successful fluoroscopic guided placement, the gastrostomy tube is appropriately positioned with internal retention balloon against the ventral aspect of the gastric lumen. IMPRESSION: Successful percutaneous placement of a 16 Fr balloon retention gastrostomy tube. PLAN: *G-tube to gravity drainage bag x 24 hrs. Liquid diet x 24 hrs *OK to cap G-tube (during 24 hr period listed above) for 2 hours post liquid meal. *OK for meds per tube 6 hrs post procedure. *OK to begin TFs and G-tube use in 24 hrs, taper from trickle to goal as tolerated. *Pt is to return to [facility] for routine G-tube exchange in 6 months. Michaelle Birks, MD [facility] Radiology Electronically Signed   By: Michaelle Birks M.D.   On: 12/30/2022 17:56   IR IMAGING GUIDED PORT INSERTION  Result Date: 12/30/2022 INDICATION: Head and neck cancer.  Chemotherapy recommended. EXAM: IMPLANTED PORT A CATH PLACEMENT WITH ULTRASOUND AND FLUOROSCOPIC GUIDANCE MEDICATIONS: None ANESTHESIA/SEDATION: Moderate (conscious) sedation was employed during this procedure. A total of Versed 2 mg and Fentanyl 100 mcg was administered intravenously. Moderate  Sedation Time: 22 minutes. The patient's level of consciousness and vital signs were monitored continuously by radiology nursing throughout the procedure under my direct supervision. FLUOROSCOPY TIME:  Fluoroscopic dose; 0 mGy COMPLICATIONS: None immediate. PROCEDURE: The procedure, risks, benefits, and alternatives were explained to the patient. Questions regarding the procedure were encouraged and answered. The patient understands and consents to the procedure. The RIGHT neck and chest were prepped with chlorhexidine in a sterile fashion, and a sterile drape was applied covering the operative field. Maximum barrier sterile technique with sterile gowns  and gloves were used for the procedure. A timeout was performed prior to the initiation of the procedure. Local anesthesia was provided with 1% lidocaine with epinephrine. After creating a small venotomy incision, a micropuncture kit was utilized to access the internal jugular vein under direct, real-time ultrasound guidance. Ultrasound image documentation was performed. The microwire was kinked to measure appropriate catheter length. A subcutaneous port pocket was then created along the upper chest wall utilizing a combination of sharp and blunt dissection. The pocket was irrigated with sterile saline. A single lumen ISP power injectable port was chosen for placement. The 8 Fr catheter was tunneled from the port pocket site to the venotomy incision. The port was placed in the pocket. The external catheter was trimmed to appropriate length. At the venotomy, an 8 Fr peel-away sheath was placed over a guidewire under fluoroscopic guidance. The catheter was then placed through the sheath and the sheath was removed. Final catheter positioning was confirmed and documented with a fluoroscopic spot radiograph. The port was accessed with a Huber needle, aspirated and flushed with heparinized saline. The port pocket incision was closed with interrupted 3-0 Vicryl suture then  Dermabond was applied, including at the venotomy incision. Dressings were placed. The patient tolerated the procedure well without immediate post procedural complication. IMPRESSION: Successful placement of a RIGHT internal jugular approach power injectable Port-A-Cath. The tip of the catheter is positioned at the superior cavo-atrial junction. The catheter is ready for immediate use. Michaelle Birks, MD [facility] Radiology Electronically Signed   By: Michaelle Birks M.D.   On: 12/30/2022 13:47    All questions were answered. The patient knows to call the clinic with any problems, questions or concerns. I spent 30 minutes in the care of this patient including H and P, review of records, counseling and coordination of care.     [name], MD 01/14/2023 8:45 AM

## 2023-01-15 ENCOUNTER — Ambulatory Visit
Admission: RE | Admit: 2023-01-15 | Discharge: 2023-01-15 | Disposition: A | Discharge status: Home / Self Care | Payer: Medicare Other | Source: Ambulatory Visit | Attending: Radiation Oncology | Admitting: Radiation Oncology

## 2023-01-15 ENCOUNTER — Inpatient Hospital Stay: Payer: Medicare Other

## 2023-01-15 ENCOUNTER — Inpatient Hospital Stay: Payer: Medicare Other | Admitting: Dietician

## 2023-01-15 ENCOUNTER — Other Ambulatory Visit: Payer: Self-pay

## 2023-01-15 VITALS — BP 113/70 | HR 72 | Temp 98.2°F | Resp 18 | Wt 135.4 lb

## 2023-01-15 DIAGNOSIS — Z5111 Encounter for antineoplastic chemotherapy: Secondary | ICD-10-CM | POA: Diagnosis not present

## 2023-01-15 DIAGNOSIS — C109 Malignant neoplasm of oropharynx, unspecified: Secondary | ICD-10-CM

## 2023-01-15 DIAGNOSIS — Z510 Encounter for antineoplastic radiation therapy: Secondary | ICD-10-CM | POA: Diagnosis not present

## 2023-01-15 DIAGNOSIS — C090 Malignant neoplasm of tonsillar fossa: Secondary | ICD-10-CM | POA: Diagnosis not present

## 2023-01-15 LAB — RAD ONC ARIA SESSION SUMMARY
Course Elapsed Days: 36
Plan Fractions Treated to Date: 26
Plan Prescribed Dose Per Fraction: 2 Gy
Plan Total Fractions Prescribed: 35
Plan Total Prescribed Dose: 70 Gy
Reference Point Dosage Given to Date: 52 Gy
Reference Point Session Dosage Given: 2 Gy
Session Number: 26

## 2023-01-15 MED ORDER — SODIUM CHLORIDE 0.9 % IV SOLN
150.0000 mg | Freq: Once | INTRAVENOUS | Status: AC
  Administered 2023-01-15: 150 mg via INTRAVENOUS
  Filled 2023-01-15: qty 150

## 2023-01-15 MED ORDER — POTASSIUM CHLORIDE IN NACL 20-0.9 MEQ/L-% IV SOLN
Freq: Once | INTRAVENOUS | Status: AC
  Filled 2023-01-15: qty 1000

## 2023-01-15 MED ORDER — SODIUM CHLORIDE 0.9% FLUSH
10.0000 mL | INTRAVENOUS | Status: DC | PRN
  Administered 2023-01-15: 10 mL

## 2023-01-15 MED ORDER — PALONOSETRON HCL INJECTION 0.25 MG/5ML
0.2500 mg | Freq: Once | INTRAVENOUS | Status: AC
  Administered 2023-01-15: 0.25 mg via INTRAVENOUS
  Filled 2023-01-15: qty 5

## 2023-01-15 MED ORDER — SODIUM CHLORIDE 0.9 % IV SOLN: Freq: Once | INTRAVENOUS | Status: AC

## 2023-01-15 MED ORDER — SODIUM CHLORIDE 0.9 % IV SOLN
10.0000 mg | Freq: Once | INTRAVENOUS | Status: AC
  Administered 2023-01-15: 10 mg via INTRAVENOUS
  Filled 2023-01-15: qty 10

## 2023-01-15 MED ORDER — MAGNESIUM SULFATE 2 GM/50ML IV SOLN
2.0000 g | Freq: Once | INTRAVENOUS | Status: AC
  Administered 2023-01-15: 2 g via INTRAVENOUS
  Filled 2023-01-15: qty 50

## 2023-01-15 MED ORDER — HEPARIN SOD (PORK) LOCK FLUSH 100 UNIT/ML IV SOLN
500.0000 [IU] | Freq: Once | INTRAVENOUS | Status: AC | PRN
  Administered 2023-01-15: 500 [IU]

## 2023-01-15 MED ORDER — SODIUM CHLORIDE 0.9 % IV SOLN
40.0000 mg/m2 | Freq: Once | INTRAVENOUS | Status: AC
  Administered 2023-01-15: 74 mg via INTRAVENOUS
  Filled 2023-01-15: qty 74

## 2023-01-15 NOTE — Progress Notes (Signed)
Nutrition Follow-up:  Patient with SCC of oropharynx. He is receiving concurrent chemoradiation under the care of Dr. Chryl Heck and Dr. Isidore Moos (start 1/3). Chemo held 1/17     Met with patient in infusion. He states he is doing "a lot better" Patient reports diarrhea has improved. He finds using Banatrol once daily to be helpful, states bowel movement yesterday was formed. Patient continues working to increase tube feedings. He reports give one carton Osmolite 1.5 QID. Patient is flushing with 60 ml water before and after. Patient is unable to drink or eat orally. He reports tolerating no more than a tablespoon of water at time. Patient says water comes out of his nose when he tries to drink orally. He is asking for clarification on additional water flushes to meet hydration needs given this.    Medications: reviewed   Labs: reviewed   Anthropometrics: Wt 135 lb 6 oz today   2/2 - 136 lb 4.8 oz 1/23 - 136 lb 12.8 oz  1/22 - 141 lb 1.5 oz    NUTRITION DIAGNOSIS: Unintended weight loss - addressing with TF   INTERVENTION:  Continue increasing tube feedings to goal of 7 cartons/day as tolerated. Patient agreeable try increasing by one carton. He will split additional carton over four feedings to give 5 cartons/day Educated on additional water flushes needed to meet hydration needs. Teach back method used as well as written instructions given for patient Continue banatrol TF daily as needed - additional samples provided   -5 cartons Osmolite 1.5 split over four feedings daily. (1200 ml/day) Flush tube with 60 ml water before and after each feeding. Flush tube with additional 120 ml water QID This provides 1775 kcal, 74.5 g protein, 905 ml free water from formula (1865 ml total water with flushes)     MONITORING, EVALUATION, GOAL: weight trends, oral intake, TF   NEXT VISIT: Wednesday February 14 during infusion

## 2023-01-15 NOTE — Patient Instructions (Signed)
[facility]  Discharge Instructions: Thank you for choosing [facility] to provide your oncology and hematology care.   If you have a lab appointment with the Cancer Center, please go directly to the Cancer Center and check in at the registration area.   Wear comfortable clothing and clothing appropriate for easy access to any Portacath or PICC line.   We strive to give you quality time with your provider. You may need to reschedule your appointment if you arrive late (15 or more minutes).  Arriving late affects you and other patients whose appointments are after yours.  Also, if you miss three or more appointments without notifying the office, you may be dismissed from the clinic at the provider's discretion.      For prescription refill requests, have your pharmacy contact our office and allow 72 hours for refills to be completed.    Today you received the following chemotherapy and/or immunotherapy agents Cisplatin      To help prevent nausea and vomiting after your treatment, we encourage you to take your nausea medication as directed.  BELOW ARE SYMPTOMS THAT SHOULD BE REPORTED IMMEDIATELY: *FEVER GREATER THAN 100.4 F (38 C) OR HIGHER *CHILLS OR SWEATING *NAUSEA AND VOMITING THAT IS NOT CONTROLLED WITH YOUR NAUSEA MEDICATION *UNUSUAL SHORTNESS OF BREATH *UNUSUAL BRUISING OR BLEEDING *URINARY PROBLEMS (pain or burning when urinating, or frequent urination) *BOWEL PROBLEMS (unusual diarrhea, constipation, pain near the anus) TENDERNESS IN MOUTH AND THROAT WITH OR WITHOUT PRESENCE OF ULCERS (sore throat, sores in mouth, or a toothache) UNUSUAL RASH, SWELLING OR PAIN  UNUSUAL VAGINAL DISCHARGE OR ITCHING   Items with * indicate a potential emergency and should be followed up as soon as possible or go to the Emergency Department if any problems should occur.  Please show the CHEMOTHERAPY ALERT CARD or IMMUNOTHERAPY ALERT CARD at  check-in to the Emergency Department and triage nurse.  Should you have questions after your visit or need to cancel or reschedule your appointment, please contact [facility]  Dept: [phone]  and follow the prompts.  Office hours are 8:00 a.m. to 4:30 p.m. Monday - Friday. Please note that voicemails left after 4:00 p.m. may not be returned until the following business day.  We are closed weekends and major holidays. You have access to a nurse at all times for urgent questions. Please call the main number to the clinic Dept: [phone] and follow the prompts.   For any non-urgent questions, you may also contact your provider using MyChart. We now offer e-Visits for anyone 18 and older to request care online for non-urgent symptoms. For details visit [email or web address].   Also download the MyChart app! Go to the app store, search "MyChart", open the app, select [facility], and log in with your MyChart username and password.   

## 2023-01-16 ENCOUNTER — Other Ambulatory Visit: Payer: Self-pay

## 2023-01-16 ENCOUNTER — Ambulatory Visit
Admission: RE | Admit: 2023-01-16 | Discharge: 2023-01-16 | Disposition: A | Discharge status: Home / Self Care | Payer: Medicare Other | Source: Ambulatory Visit | Attending: Radiation Oncology | Admitting: Radiation Oncology

## 2023-01-16 ENCOUNTER — Telehealth: Payer: Self-pay

## 2023-01-16 DIAGNOSIS — Z510 Encounter for antineoplastic radiation therapy: Secondary | ICD-10-CM | POA: Diagnosis not present

## 2023-01-16 DIAGNOSIS — C109 Malignant neoplasm of oropharynx, unspecified: Secondary | ICD-10-CM | POA: Diagnosis not present

## 2023-01-16 DIAGNOSIS — C090 Malignant neoplasm of tonsillar fossa: Secondary | ICD-10-CM | POA: Diagnosis not present

## 2023-01-16 LAB — RAD ONC ARIA SESSION SUMMARY
Course Elapsed Days: 37
Plan Fractions Treated to Date: 27
Plan Prescribed Dose Per Fraction: 2 Gy
Plan Total Fractions Prescribed: 35
Plan Total Prescribed Dose: 70 Gy
Reference Point Dosage Given to Date: 54 Gy
Reference Point Session Dosage Given: 2 Gy
Session Number: 27

## 2023-01-16 NOTE — Telephone Encounter (Signed)
Attempted to call Patient regarding message left with Front Receptionist. Unable to reach patient. Left voicemail with telephone number for Patient to return call.

## 2023-01-17 ENCOUNTER — Telehealth: Payer: Self-pay

## 2023-01-17 ENCOUNTER — Inpatient Hospital Stay: Payer: Medicare Other

## 2023-01-17 ENCOUNTER — Ambulatory Visit
Admission: RE | Admit: 2023-01-17 | Discharge: 2023-01-17 | Disposition: A | Discharge status: Home / Self Care | Payer: Medicare Other | Source: Ambulatory Visit | Attending: Radiation Oncology | Admitting: Radiation Oncology

## 2023-01-17 ENCOUNTER — Other Ambulatory Visit: Payer: Self-pay

## 2023-01-17 DIAGNOSIS — C090 Malignant neoplasm of tonsillar fossa: Secondary | ICD-10-CM | POA: Diagnosis not present

## 2023-01-17 DIAGNOSIS — Z5111 Encounter for antineoplastic chemotherapy: Secondary | ICD-10-CM | POA: Diagnosis not present

## 2023-01-17 DIAGNOSIS — Z510 Encounter for antineoplastic radiation therapy: Secondary | ICD-10-CM | POA: Diagnosis not present

## 2023-01-17 DIAGNOSIS — C109 Malignant neoplasm of oropharynx, unspecified: Secondary | ICD-10-CM | POA: Diagnosis not present

## 2023-01-17 DIAGNOSIS — Z95828 Presence of other vascular implants and grafts: Secondary | ICD-10-CM

## 2023-01-17 LAB — BASIC METABOLIC PANEL - CANCER CENTER ONLY
Anion gap: 5 (ref 5–15)
BUN: 24 mg/dL — ABNORMAL HIGH (ref 8–23)
CO2: 32 mmol/L (ref 22–32)
Calcium: 9.2 mg/dL (ref 8.9–10.3)
Chloride: 98 mmol/L (ref 98–111)
Creatinine: 0.74 mg/dL (ref 0.61–1.24)
GFR, Estimated: >60 mL/min (ref >60)
Glucose, Bld: 79 mg/dL (ref 70–99)
Potassium: 4.3 mmol/L (ref 3.5–5.1)
Sodium: 135 mmol/L (ref 135–145)

## 2023-01-17 LAB — RAD ONC ARIA SESSION SUMMARY
Course Elapsed Days: 38
Plan Fractions Treated to Date: 28
Plan Prescribed Dose Per Fraction: 2 Gy
Plan Total Fractions Prescribed: 35
Plan Total Prescribed Dose: 70 Gy
Reference Point Dosage Given to Date: 56 Gy
Reference Point Session Dosage Given: 2 Gy
Session Number: 28

## 2023-01-17 LAB — CBC WITH DIFFERENTIAL (CANCER CENTER ONLY)
Abs Immature Granulocytes: 0.03 10*3/uL (ref 0.00–0.07)
Basophils Absolute: 0 10*3/uL (ref 0.0–0.1)
Basophils Relative: 0 %
Eosinophils Absolute: 0 10*3/uL (ref 0.0–0.5)
Eosinophils Relative: 0 %
HCT: 32.8 % — ABNORMAL LOW (ref 39.0–52.0)
Hemoglobin: 11.4 g/dL — ABNORMAL LOW (ref 13.0–17.0)
Immature Granulocytes: 1 %
Lymphocytes Relative: 5 %
Lymphs Abs: 0.3 10*3/uL — ABNORMAL LOW (ref 0.7–4.0)
MCH: 32.7 pg (ref 26.0–34.0)
MCHC: 34.8 g/dL (ref 30.0–36.0)
MCV: 94 fL (ref 80.0–100.0)
Monocytes Absolute: 1 10*3/uL (ref 0.1–1.0)
Monocytes Relative: 15 %
Neutro Abs: 5.2 10*3/uL (ref 1.7–7.7)
Neutrophils Relative %: 79 %
Platelet Count: 131 10*3/uL — ABNORMAL LOW (ref 150–400)
RBC: 3.49 MIL/uL — ABNORMAL LOW (ref 4.22–5.81)
RDW: 14.5 % (ref 11.5–15.5)
WBC Count: 6.5 10*3/uL (ref 4.0–10.5)
nRBC: 0 % (ref 0.0–0.2)

## 2023-01-17 LAB — MAGNESIUM: Magnesium: 2 mg/dL (ref 1.7–2.4)

## 2023-01-17 MED ORDER — SODIUM CHLORIDE 0.9% FLUSH
10.0000 mL | Freq: Once | INTRAVENOUS | Status: AC
  Administered 2023-01-17: 10 mL

## 2023-01-17 MED ORDER — HEPARIN SOD (PORK) LOCK FLUSH 100 UNIT/ML IV SOLN
500.0000 [IU] | Freq: Once | INTRAVENOUS | Status: AC
  Administered 2023-01-17: 500 [IU]

## 2023-01-17 NOTE — Telephone Encounter (Signed)
Returned phone call received from Patient regarding FMLA forms. Patient states that his FMLA forms were not received by his Employer. Original documents were faxed to [employer] at [phone]. Patient requests that FMLA forms be emailed to [email or web address] as soon as possible. Forms emailed as requested. No other needs or concerns voiced at this time.

## 2023-01-20 ENCOUNTER — Ambulatory Visit
Admission: RE | Admit: 2023-01-20 | Discharge: 2023-01-20 | Disposition: A | Discharge status: Home / Self Care | Payer: Medicare Other | Source: Ambulatory Visit | Attending: Radiation Oncology | Admitting: Radiation Oncology

## 2023-01-20 ENCOUNTER — Other Ambulatory Visit: Payer: Self-pay

## 2023-01-20 DIAGNOSIS — Z510 Encounter for antineoplastic radiation therapy: Secondary | ICD-10-CM | POA: Diagnosis not present

## 2023-01-20 DIAGNOSIS — C109 Malignant neoplasm of oropharynx, unspecified: Secondary | ICD-10-CM | POA: Diagnosis not present

## 2023-01-20 DIAGNOSIS — C090 Malignant neoplasm of tonsillar fossa: Secondary | ICD-10-CM | POA: Diagnosis not present

## 2023-01-20 LAB — RAD ONC ARIA SESSION SUMMARY
Course Elapsed Days: 41
Plan Fractions Treated to Date: 29
Plan Prescribed Dose Per Fraction: 2 Gy
Plan Total Fractions Prescribed: 35
Plan Total Prescribed Dose: 70 Gy
Reference Point Dosage Given to Date: 58 Gy
Reference Point Session Dosage Given: 2 Gy
Session Number: 29

## 2023-01-21 ENCOUNTER — Other Ambulatory Visit: Payer: Self-pay

## 2023-01-21 ENCOUNTER — Ambulatory Visit
Admission: RE | Admit: 2023-01-21 | Discharge: 2023-01-21 | Disposition: A | Discharge status: Home / Self Care | Payer: Medicare Other | Source: Ambulatory Visit | Attending: Radiation Oncology | Admitting: Radiation Oncology

## 2023-01-21 ENCOUNTER — Encounter: Payer: Self-pay | Admitting: Hematology and Oncology

## 2023-01-21 ENCOUNTER — Inpatient Hospital Stay: Payer: Medicare Other

## 2023-01-21 ENCOUNTER — Telehealth: Payer: Self-pay

## 2023-01-21 ENCOUNTER — Inpatient Hospital Stay (HOSPITAL_BASED_OUTPATIENT_CLINIC_OR_DEPARTMENT_OTHER): Payer: Medicare Other | Admitting: Hematology and Oncology

## 2023-01-21 DIAGNOSIS — C090 Malignant neoplasm of tonsillar fossa: Secondary | ICD-10-CM

## 2023-01-21 DIAGNOSIS — Z510 Encounter for antineoplastic radiation therapy: Secondary | ICD-10-CM | POA: Diagnosis not present

## 2023-01-21 DIAGNOSIS — C109 Malignant neoplasm of oropharynx, unspecified: Secondary | ICD-10-CM

## 2023-01-21 DIAGNOSIS — Z95828 Presence of other vascular implants and grafts: Secondary | ICD-10-CM

## 2023-01-21 DIAGNOSIS — Z5111 Encounter for antineoplastic chemotherapy: Secondary | ICD-10-CM | POA: Diagnosis not present

## 2023-01-21 DIAGNOSIS — R634 Abnormal weight loss: Secondary | ICD-10-CM

## 2023-01-21 LAB — CMP (CANCER CENTER ONLY)
ALT: 29 U/L (ref 0–44)
AST: 19 U/L (ref 15–41)
Albumin: 3.9 g/dL (ref 3.5–5.0)
Alkaline Phosphatase: 57 U/L (ref 38–126)
Anion gap: 5 (ref 5–15)
BUN: 20 mg/dL (ref 8–23)
CO2: 33 mmol/L — ABNORMAL HIGH (ref 22–32)
Calcium: 9.4 mg/dL (ref 8.9–10.3)
Chloride: 97 mmol/L — ABNORMAL LOW (ref 98–111)
Creatinine: 0.69 mg/dL (ref 0.61–1.24)
GFR, Estimated: >60 mL/min (ref >60)
Glucose, Bld: 87 mg/dL (ref 70–99)
Potassium: 4.4 mmol/L (ref 3.5–5.1)
Sodium: 135 mmol/L (ref 135–145)
Total Bilirubin: 0.5 mg/dL (ref 0.3–1.2)
Total Protein: 6.6 g/dL (ref 6.5–8.1)

## 2023-01-21 LAB — RAD ONC ARIA SESSION SUMMARY
Course Elapsed Days: 42
Plan Fractions Treated to Date: 30
Plan Prescribed Dose Per Fraction: 2 Gy
Plan Total Fractions Prescribed: 35
Plan Total Prescribed Dose: 70 Gy
Reference Point Dosage Given to Date: 60 Gy
Reference Point Session Dosage Given: 2 Gy
Session Number: 30

## 2023-01-21 LAB — CBC WITH DIFFERENTIAL (CANCER CENTER ONLY)
Abs Immature Granulocytes: 0.01 10*3/uL (ref 0.00–0.07)
Basophils Absolute: 0 10*3/uL (ref 0.0–0.1)
Basophils Relative: 0 %
Eosinophils Absolute: 0 10*3/uL (ref 0.0–0.5)
Eosinophils Relative: 0 %
HCT: 32.1 % — ABNORMAL LOW (ref 39.0–52.0)
Hemoglobin: 11.2 g/dL — ABNORMAL LOW (ref 13.0–17.0)
Immature Granulocytes: 0 %
Lymphocytes Relative: 8 %
Lymphs Abs: 0.2 10*3/uL — ABNORMAL LOW (ref 0.7–4.0)
MCH: 32.7 pg (ref 26.0–34.0)
MCHC: 34.9 g/dL (ref 30.0–36.0)
MCV: 93.6 fL (ref 80.0–100.0)
Monocytes Absolute: 0.7 10*3/uL (ref 0.1–1.0)
Monocytes Relative: 24 %
Neutro Abs: 1.9 10*3/uL (ref 1.7–7.7)
Neutrophils Relative %: 68 %
Platelet Count: 129 10*3/uL — ABNORMAL LOW (ref 150–400)
RBC: 3.43 MIL/uL — ABNORMAL LOW (ref 4.22–5.81)
RDW: 14.3 % (ref 11.5–15.5)
WBC Count: 2.9 10*3/uL — ABNORMAL LOW (ref 4.0–10.5)
nRBC: 0 % (ref 0.0–0.2)

## 2023-01-21 LAB — MAGNESIUM: Magnesium: 2 mg/dL (ref 1.7–2.4)

## 2023-01-21 MED ORDER — HEPARIN SOD (PORK) LOCK FLUSH 100 UNIT/ML IV SOLN
500.0000 [IU] | Freq: Once | INTRAVENOUS | Status: AC
  Administered 2023-01-21: 500 [IU]

## 2023-01-21 MED ORDER — SODIUM CHLORIDE 0.9% FLUSH
10.0000 mL | Freq: Once | INTRAVENOUS | Status: AC
  Administered 2023-01-21: 10 mL

## 2023-01-21 MED FILL — Fosaprepitant Dimeglumine For IV Infusion 150 MG (Base Eq): INTRAVENOUS | Qty: 5 | Status: AC

## 2023-01-21 MED FILL — Dexamethasone Sodium Phosphate Inj 100 MG/10ML: INTRAMUSCULAR | Qty: 1 | Status: AC

## 2023-01-21 NOTE — Progress Notes (Signed)
[name] CONSULT NOTE  Patient Care Team: [name], NP as PCP - General (Family Medicine) [name], [name], RN as Oncology Nurse Navigator [name], MD as Consulting Physician (Hematology and Oncology) [name], MD as Consulting Physician (Radiation Oncology) [name], MD as Consulting Physician (Otolaryngology)  CHIEF COMPLAINTS/PURPOSE OF CONSULTATION:   SCC oropharynx  ASSESSMENT & PLAN:   #1 This is a very pleasant 67 year old healthy male patient with no significant past medical history newly diagnosed with squamous cell carcinoma both sides, locally advanced with ipsilateral and possibly contralateral lymphadenopathy as well presented to medical oncology for recommendations.  Given the invasion of skeletal muscle, he may be actually a T4 N2 M0 staging so far.    PET on 12/14 no evidence of metastatic disease.Large hypermetabolic right-sided oropharyngeal mass consistent with known squamous cell neoplasm. Associated extensive bilateral multistation hypermetabolic lymphadenopathy. We have discussed that for locally advanced HPV positive oropharyngeal cancers, primary treatment would be to recommend concurrent chemoradiation provided there is no evidence of metastatic disease. I have discussed about the regimen, schedule, adverse effects history significant for chest pain ototoxicity, toxicity, peripheral neuropathy.  He understands that some of the side effects can be life-threatening and permanent.  He has now completed 5 cycles of chemotherapy. He now appears to have complete resolution of palpable cervical lymphadenopathy.  Labs from today pending.  Okay to proceed tomorrow if parameters are met.  Will arrange for IV fluids on Friday and Monday since he is not hydrating well.  #2 cancer related pain, patient has been using hydrocodone once a day.  He is also using baking soda rinses for mouth sores. No change in the pain.  #3  Stable  weight since he has been using G tube as recommended. # CINV, taking anti nausea medication as needed.  #4 Mild thrombocytopenia, from cisplatin. Ok to continue treatment as planned tomorrow if rest of the parameters are met.  Please do not hesitate to contact us with any additional questions or concerns  HISTORY OF PRESENTING ILLNESS:  Alec Gibson 67 y.o. 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  Chronology  [name] is a 67 year old male patient no significant medical history who first noticed pain in the right side of his jaw as well as his ER about 3 to 4 months ago.  He used to notice it when he suddenly moves his head towards the right side but did not think much of it.  About for the past month and a half he had excruciating episodes of pain.  He describes this pain as it was not similar job and moves to his ear followed by severe pain in the neck.   The pain is so intense that he has to moan at times.   He got some medication from Dr. Sabino Gasser hydrocodone which she has been taking as needed, he does not really need 1 of this again.  Other than this pain, he denies any new complaints.  He is able to swallow everything.  He had CT imaging on 10/30/2022 which showed 3.5 x 2.3 cm solid mass lesion centered in the right Tonsil with effacement of the edges Glossotonsillar sulcus and likely posteromedial extent along the posterior oropharyngeal wall consistent with provided history of oropharyngeal neoplasm.  There is also a 1.4 cm morphologically suspicious right level 1B lymph node and additional suspected pathologic lymphadenopathy at level 2 with short segment occlusion of right internal jugular vein in the mid neck suboptimally evaluated due  to streak artifact from dental amalgam. PET scan with no metastatic disease.  C1D1 12/10/2022 C2D1 12/17/2022 Port and PEG tube scheduled for 12/13/2022 C3 delayed by a week to 01/01/2023 C5D1 01/15/2023  Interval History  Since last visit, he continues 4  tube feedings via G tube.  He says he had a fall last week when he woke up in the morning to go pee. He woke up after a few seconds, he immediately regained consciousness.  This has not happened after this event.  He is still not able to flush and of water with his feedings.   He tells me that every time he tried it he had a bad bout of diarrhea so he is very reluctant. He otherwise denies any change in the pain.  No new hearing changes, neuropathy.  He is having regular bowel movements, urine is lighter color.  No dysuria or lower abdominal pain or fevers or chills. Rest of the pertinent 10 point ROS reviewed and negative   Wt Readings from Last 3 Encounters:  01/21/23 135 lb 4.8 oz (61.4 kg)  01/15/23 135 lb 6 oz (61.4 kg)  01/14/23 135 lb 1.6 oz (61.3 kg)     MEDICAL HISTORY:  Past Medical History:  Diagnosis Date   Anxiety    Depression    Thyroid disease     SURGICAL HISTORY: Past Surgical History:  Procedure Laterality Date   IR GASTROSTOMY TUBE MOD SED  12/30/2022   IR IMAGING GUIDED PORT INSERTION  12/30/2022   IR NASO G TUBE PLC W/FL W/RAD  12/30/2022    SOCIAL HISTORY: Social History   Socioeconomic History   Marital status: Divorced    Spouse name: Not on file   Number of children: Not on file   Years of education: Not on file   Highest education level: Not on file  Occupational History   Not on file  Tobacco Use   Smoking status: Never   Smokeless tobacco: Never  Substance and Sexual Activity   Alcohol use: Yes   Drug use: Never   Sexual activity: Not on file  Other Topics Concern   Not on file  Social History Narrative   Not on file   Social Determinants of Health   Financial Resource Strain: Medium Risk (12/12/2022)   Overall Financial Resource Strain (CARDIA)    Difficulty of Paying Living Expenses: Somewhat hard  Food Insecurity: No Food Insecurity (12/12/2022)   Hunger Vital Sign    Worried About Running Out of Food in the Last Year: Never true     Ran Out of Food in the Last Year: Never true  Transportation Needs: No Transportation Needs (12/12/2022)   PRAPARE - Hydrologist (Medical): No    Lack of Transportation (Non-Medical): No  Physical Activity: Not on file  Stress: Not on file  Social Connections: Not on file  Intimate Partner Violence: Not on file    FAMILY HISTORY: History reviewed. No pertinent family history.  ALLERGIES:  is allergic to sulfa antibiotics.  MEDICATIONS:  Current Outpatient Medications  Medication Sig Dispense Refill   Cholecalciferol (VITAMIN D-3 PO) Take 1 capsule by mouth in the morning.     clonazePAM (KLONOPIN) 1 MG tablet Take 1 mg by mouth 2 (two) times daily as needed for anxiety (sleep).     dexamethasone (DECADRON) 4 MG tablet Take 2 tablets daily x 3 days starting the day after cisplatin chemotherapy. Take with food. (Patient not taking: Reported on 12/13/2022)  30 tablet 1   HYDROcodone-acetaminophen (NORCO/VICODIN) 5-325 MG tablet Take 1 tablet by mouth every 6 (six) hours as needed for moderate pain. 30 tablet 0   levocetirizine (XYZAL) 5 MG tablet Take 5 mg by mouth in the morning.     levothyroxine (SYNTHROID) 175 MCG tablet Take 1 tablet (175 mcg total) by mouth daily before breakfast. 30 tablet 0   lidocaine (XYLOCAINE) 2 % solution Patient: Mix 1part 2% viscous lidocaine, 1part H20. Swish & swallow 84m of diluted mixture, 351m before meals and at bedtime, up to QID 200 mL 3   lidocaine-prilocaine (EMLA) cream Apply to affected area once (Patient not taking: Reported on 12/13/2022) 30 g 3   Multiple Vitamin (MULTIVITAMIN) tablet Take 1 tablet by mouth in the morning.     Multiple Vitamins-Minerals (ZINC PO) Take 1 tablet by mouth in the morning.     Omega-3 Fatty Acids (FISH OIL PO) Take 1 capsule by mouth in the morning.     ondansetron (ZOFRAN) 8 MG tablet Take 1 tablet (8 mg total) by mouth every 8 (eight) hours as needed for nausea or vomiting. Start on the  third day after cisplatin. 30 tablet 1   prochlorperazine (COMPAZINE) 10 MG tablet Take 1 tablet (10 mg total) by mouth every 6 (six) hours as needed (Nausea or vomiting). (Patient not taking: Reported on 12/13/2022) 30 tablet 1   VITAMIN E PO Take 1 capsule by mouth in the morning.     No current facility-administered medications for this visit.     PHYSICAL EXAMINATION: ECOG PERFORMANCE STATUS: 1 - Symptomatic but completely ambulatory  Vitals:   01/21/23 0842  BP: 111/73  Pulse: 78  Resp: 16  Temp: 97.9 F (36.6 C)  SpO2: 98%     Filed Weights   01/21/23 0842  Weight: 135 lb 4.8 oz (61.4 kg)   GENERAL:alert, no distress and comfortable Neck: NO palpable cervical LN HEENT: Moderate mucositis Abdomen: G tube in place.  No evidence of infection. No BLE edema  LABORATORY DATA:  I have reviewed the data as listed Lab Results  Component Value Date   WBC 2.9 (L) 01/21/2023   HGB 11.2 (L) 01/21/2023   HCT 32.1 (L) 01/21/2023   MCV 93.6 01/21/2023   PLT 129 (L) 01/21/2023     Chemistry      Component Value Date/Time   NA 135 01/17/2023 0838   K 4.3 01/17/2023 0838   CL 98 01/17/2023 0838   CO2 32 01/17/2023 0838   BUN 24 (H) 01/17/2023 0838   CREATININE 0.74 01/17/2023 0838      Component Value Date/Time   CALCIUM 9.2 01/17/2023 0838   ALKPHOS 49 01/10/2023 0804   AST 26 01/10/2023 0804   ALT 37 01/10/2023 0804   BILITOT 0.4 01/10/2023 0804       RADIOGRAPHIC STUDIES: I have personally reviewed the radiological images as listed and agreed with the findings in the report. IR Naso G Tube Plc W/FL W/Rad  Result Date: 01/09/2023 INDICATION: Dysphagia EXAM: FLUOROSCOPIC NASOGASTRIC TUBE PLACEMENT COMPARISON:  None Available. CONTRAST:  307mMNIPAQUE IOHEXOL 300 MG/ML  SOLN FLUOROSCOPY: See prior report COMPLICATIONS: None immediate PROCEDURE: FLUOROSCOPIC NASOGASTRIC TUBE PLACEMENT Please see complete, dedicated report for complete details of procedure.  IMPRESSION: Procedural accession order for fluoroscopic NG tube placement during percutaneous gastrostomy insertion on 12/30/2022. Please see complete, dedicated report for complete details of procedure. JonMichaelle BirksD [facility] Electronically Signed   By: JonWille Glaser  Mugweru M.D.   On: 01/09/2023 11:48   IR Gastrostomy Tube  Result Date: 12/30/2022 INDICATION: Head and neck cancer EXAM: Procedures: 1. FLUOROSCOPIC NASOGASTRIC TUBE PLACEMENT 2. PERCUTANEOUS GASTROSTOMY FEEDING TUBE PLACEMENT COMPARISON:  PET-CT, 11/21/2022. MEDICATIONS: Ancef 2 gm IV; Antibiotics were administered within 1 hour of the procedure. 0.5 mg glucagon IV. CONTRAST:  30 mL of Isovue 300 administered into the gastric lumen. ANESTHESIA/SEDATION: Moderate (conscious) sedation was employed during this procedure. A total of Versed 1 mg and Fentanyl 100 mcg was administered intravenously. Moderate Sedation Time: 21 minutes. The patient's level of consciousness and vital signs were monitored continuously by radiology nursing throughout the procedure under my direct supervision. FLUOROSCOPY TIME:  Fluoroscopic dose; 2 mGy COMPLICATIONS: None immediate. PROCEDURE: Informed written consent was obtained from the patient and/or patient's representative following explanation of the procedure, risks, benefits and alternatives. A time out was performed prior to the initiation of the procedure. Maximal barrier sterile technique utilized including caps, mask, sterile gowns, sterile gloves, large sterile drape, hand hygiene and Betadine prep. The LEFT upper quadrant was sterilely prepped and draped. A oral gastric catheter was inserted into the stomach under fluoroscopy. The existing nasogastric feeding tube was removed. The left costal margin and barium opacified transverse colon were identified and avoided. Air was injected into the stomach for insufflation and visualization under fluoroscopy.  Under sterile conditions and local anesthesia, 3 T tacks were utilized to pexy the anterior aspect of the stomach against the ventral abdominal wall. Contrast injection confirmed appropriate positioning of each of the T tacks. An incision was made between the T tacks and a 17 gauge trocar needle was utilized to access the stomach. Needle position was confirmed within the stomach with aspiration of air and injection of a small amount of contrast. A stiff Glidewire was advanced into the gastric lumen and under intermittent fluoroscopic guidance, the access needle was exchanged for a Kumpe catheter. With the use of the Kumpe catheter, a stiff Glidewire was advanced into the horizontal segment of the duodenum. Under intermittent fluoroscopic guidance, the Kumpe catheter was exchanged for a telescoping peel-away sheath, ultimately allowing placement of a 18-French balloon retention gastrostomy tube. The retention balloon was insufflated with a mixture of dilute saline and contrast and pulled taut against the anterior wall of the stomach. The external disc was cinched. Contrast injection confirms positioning within the stomach. Several spot radiographic images were obtained in various obliquities for documentation. T he patient tolerated procedure well without immediate post procedural complication. FINDINGS: After successful fluoroscopic guided placement, the gastrostomy tube is appropriately positioned with internal retention balloon against the ventral aspect of the gastric lumen. IMPRESSION: Successful percutaneous placement of a 16 Fr balloon retention gastrostomy tube. PLAN: *G-tube to gravity drainage bag x 24 hrs. Liquid diet x 24 hrs *OK to cap G-tube (during 24 hr period listed above) for 2 hours post liquid meal. *OK for meds per tube 6 hrs post procedure. *OK to begin TFs and G-tube use in 24 hrs, taper from trickle to goal as tolerated. *Pt is to return to [facility] for routine G-tube exchange in 6 months. Michaelle Birks, MD [facility] Radiology Electronically Signed   By: Michaelle Birks M.D.   On: 12/30/2022 17:56   IR IMAGING GUIDED PORT INSERTION  Result Date: 12/30/2022 INDICATION: Head and neck cancer.  Chemotherapy recommended. EXAM: IMPLANTED PORT A CATH PLACEMENT WITH ULTRASOUND AND FLUOROSCOPIC GUIDANCE MEDICATIONS: None ANESTHESIA/SEDATION: Moderate (conscious) sedation was employed during this procedure.  A total of Versed 2 mg and Fentanyl 100 mcg was administered intravenously. Moderate Sedation Time: 22 minutes. The patient's level of consciousness and vital signs were monitored continuously by radiology nursing throughout the procedure under my direct supervision. FLUOROSCOPY TIME:  Fluoroscopic dose; 0 mGy COMPLICATIONS: None immediate. PROCEDURE: The procedure, risks, benefits, and alternatives were explained to the patient. Questions regarding the procedure were encouraged and answered. The patient understands and consents to the procedure. The RIGHT neck and chest were prepped with chlorhexidine in a sterile fashion, and a sterile drape was applied covering the operative field. Maximum barrier sterile technique with sterile gowns and gloves were used for the procedure. A timeout was performed prior to the initiation of the procedure. Local anesthesia was provided with 1% lidocaine with epinephrine. After creating a small venotomy incision, a micropuncture kit was utilized to access the internal jugular vein under direct, real-time ultrasound guidance. Ultrasound image documentation was performed. The microwire was kinked to measure appropriate catheter length. A subcutaneous port pocket was then created along the upper chest wall utilizing a combination of sharp and blunt dissection. The pocket was irrigated with sterile saline. A single lumen ISP power injectable port was chosen for placement. The 8 Fr catheter was tunneled from the port pocket site to  the venotomy incision. The port was placed in the pocket. The external catheter was trimmed to appropriate length. At the venotomy, an 8 Fr peel-away sheath was placed over a guidewire under fluoroscopic guidance. The catheter was then placed through the sheath and the sheath was removed. Final catheter positioning was confirmed and documented with a fluoroscopic spot radiograph. The port was accessed with a Huber needle, aspirated and flushed with heparinized saline. The port pocket incision was closed with interrupted 3-0 Vicryl suture then Dermabond was applied, including at the venotomy incision. Dressings were placed. The patient tolerated the procedure well without immediate post procedural complication. IMPRESSION: Successful placement of a RIGHT internal jugular approach power injectable Port-A-Cath. The tip of the catheter is positioned at the superior cavo-atrial junction. The catheter is ready for immediate use. Michaelle Birks, MD [facility] Radiology Electronically Signed   By: Michaelle Birks M.D.   On: 12/30/2022 13:47    All questions were answered. The patient knows to call the clinic with any problems, questions or concerns. I spent 30 minutes in the care of this patient including H and P, review of records, counseling and coordination of care.     [name], MD 01/21/2023 9:00 AM

## 2023-01-21 NOTE — Telephone Encounter (Signed)
Attempted to call pt to make aware we scheduled appts for IVF Friday 2/16 and Monday 2/19. LVM for pt to call back to give details.

## 2023-01-22 ENCOUNTER — Inpatient Hospital Stay: Payer: Medicare Other

## 2023-01-22 ENCOUNTER — Other Ambulatory Visit: Payer: Self-pay

## 2023-01-22 ENCOUNTER — Inpatient Hospital Stay: Payer: Medicare Other | Admitting: Dietician

## 2023-01-22 ENCOUNTER — Ambulatory Visit
Admission: RE | Admit: 2023-01-22 | Discharge: 2023-01-22 | Disposition: A | Discharge status: Home / Self Care | Payer: Medicare Other | Source: Ambulatory Visit | Attending: Radiation Oncology | Admitting: Radiation Oncology

## 2023-01-22 VITALS — BP 122/72 | HR 72 | Temp 98.2°F | Resp 18

## 2023-01-22 DIAGNOSIS — Z5111 Encounter for antineoplastic chemotherapy: Secondary | ICD-10-CM | POA: Diagnosis not present

## 2023-01-22 DIAGNOSIS — C090 Malignant neoplasm of tonsillar fossa: Secondary | ICD-10-CM | POA: Diagnosis not present

## 2023-01-22 DIAGNOSIS — Z510 Encounter for antineoplastic radiation therapy: Secondary | ICD-10-CM | POA: Diagnosis not present

## 2023-01-22 DIAGNOSIS — C109 Malignant neoplasm of oropharynx, unspecified: Secondary | ICD-10-CM

## 2023-01-22 LAB — RAD ONC ARIA SESSION SUMMARY
Course Elapsed Days: 43
Plan Fractions Treated to Date: 31
Plan Prescribed Dose Per Fraction: 2 Gy
Plan Total Fractions Prescribed: 35
Plan Total Prescribed Dose: 70 Gy
Reference Point Dosage Given to Date: 62 Gy
Reference Point Session Dosage Given: 2 Gy
Session Number: 31

## 2023-01-22 MED ORDER — POTASSIUM CHLORIDE IN NACL 20-0.9 MEQ/L-% IV SOLN
Freq: Once | INTRAVENOUS | Status: AC
  Filled 2023-01-22: qty 1000

## 2023-01-22 MED ORDER — SODIUM CHLORIDE 0.9 % IV SOLN: Freq: Once | INTRAVENOUS | Status: AC

## 2023-01-22 MED ORDER — MAGNESIUM SULFATE 2 GM/50ML IV SOLN
2.0000 g | Freq: Once | INTRAVENOUS | Status: AC
  Administered 2023-01-22: 2 g via INTRAVENOUS
  Filled 2023-01-22: qty 50

## 2023-01-22 MED ORDER — SODIUM CHLORIDE 0.9 % IV SOLN
40.0000 mg/m2 | Freq: Once | INTRAVENOUS | Status: AC
  Administered 2023-01-22: 74 mg via INTRAVENOUS
  Filled 2023-01-22: qty 74

## 2023-01-22 MED ORDER — SODIUM CHLORIDE 0.9 % IV SOLN
150.0000 mg | Freq: Once | INTRAVENOUS | Status: AC
  Administered 2023-01-22: 150 mg via INTRAVENOUS
  Filled 2023-01-22: qty 150

## 2023-01-22 MED ORDER — PALONOSETRON HCL INJECTION 0.25 MG/5ML
0.2500 mg | Freq: Once | INTRAVENOUS | Status: AC
  Administered 2023-01-22: 0.25 mg via INTRAVENOUS
  Filled 2023-01-22: qty 5

## 2023-01-22 MED ORDER — SODIUM CHLORIDE 0.9 % IV SOLN
10.0000 mg | Freq: Once | INTRAVENOUS | Status: AC
  Administered 2023-01-22: 10 mg via INTRAVENOUS
  Filled 2023-01-22: qty 10

## 2023-01-22 NOTE — Progress Notes (Signed)
Nutrition Follow-up:  Patient with SCC of oropharynx. He is receiving concurrent chemoradiation under the care of Dr. Chryl Heck and Dr. Isidore Moos (start 1/3).   Met with patient during infusion. He reports tube feedings are going "fantastic" Patient has increased to 5 cartons Osmolite 1.5. Patient is doing 5 feedings/day. He recalls flushing with 60 ml before and after each feeding and banatrol. He is giving additional 120 -240 ml water once a day. The daily banatrol has been helpful. Patient says he is having regular daily bowel movements.He is requesting additional samples today. He is not eating or drinking orally. Patient reports thick saliva. He is doing baking soda salt water rinses several times daily.    Medications: reviewed   Labs: reviewed   Anthropometrics: Weight 135 lb 4.8 oz on 2/13 stable   2/7 - 135 lb 6 oz  2/2 - 136 lb 4.8 oz 1/23 - 136 lb 12.8 oz   NUTRITION DIAGNOSIS: Unintended weight loss - addressing with TF   INTERVENTION:  Continue 5 cartons Osmolite 1.5/day Continue baking soda salt water rinses  Suggested ginger ale rinse for thick saliva Additional samples of Banatrol TF provided     MONITORING, EVALUATION, GOAL: weight trends, tube feeding   NEXT VISIT: Wednesday February 21 via telephone (pt aware)

## 2023-01-23 ENCOUNTER — Ambulatory Visit: Payer: Medicare Other

## 2023-01-23 ENCOUNTER — Ambulatory Visit
Admission: RE | Admit: 2023-01-23 | Discharge: 2023-01-23 | Disposition: A | Discharge status: Home / Self Care | Payer: Medicare Other | Source: Ambulatory Visit | Attending: Radiation Oncology | Admitting: Radiation Oncology

## 2023-01-23 ENCOUNTER — Other Ambulatory Visit: Payer: Self-pay

## 2023-01-23 DIAGNOSIS — C109 Malignant neoplasm of oropharynx, unspecified: Secondary | ICD-10-CM | POA: Diagnosis not present

## 2023-01-23 DIAGNOSIS — Z510 Encounter for antineoplastic radiation therapy: Secondary | ICD-10-CM | POA: Diagnosis not present

## 2023-01-23 DIAGNOSIS — C090 Malignant neoplasm of tonsillar fossa: Secondary | ICD-10-CM | POA: Diagnosis not present

## 2023-01-23 DIAGNOSIS — R1310 Dysphagia, unspecified: Secondary | ICD-10-CM

## 2023-01-23 LAB — RAD ONC ARIA SESSION SUMMARY
Course Elapsed Days: 44
Plan Fractions Treated to Date: 32
Plan Prescribed Dose Per Fraction: 2 Gy
Plan Total Fractions Prescribed: 35
Plan Total Prescribed Dose: 70 Gy
Reference Point Dosage Given to Date: 64 Gy
Reference Point Session Dosage Given: 2 Gy
Session Number: 32

## 2023-01-23 NOTE — Therapy (Signed)
[facility] [address], [place], [place], [address] Phone: [phone]   Fax:  [phone]  Patient Details  Name: Alec Gibson MRN: [id number] Date of Birth: [birth year] Referring Provider:  [name] MD  Encounter Date: 01/23/2023  Speech Therapy (ST) Arrive-Cancel SLP consulted with pt and ascertained pt's swallowing difficulties at this time were due to side effects from rad tx and he had developed strategies for passing meds and H2O ingestion with limited nasal regurgitation and coughing. RN navigator to reach out to pt to inform him of liqiud options for his meds/ability to crush meds and put into PEG at this time, temporarily. SLP to see pt at his next scheduled appointment in March.  Garald Balding, MS, SLP  01/23/2023, 8:57 AM  [facility] [address], [place], [place], [address] Phone: [phone]   Fax:  [phone]

## 2023-01-24 ENCOUNTER — Other Ambulatory Visit: Payer: Self-pay

## 2023-01-24 ENCOUNTER — Inpatient Hospital Stay: Payer: Medicare Other

## 2023-01-24 ENCOUNTER — Ambulatory Visit
Admission: RE | Admit: 2023-01-24 | Discharge: 2023-01-24 | Disposition: A | Discharge status: Home / Self Care | Payer: Medicare Other | Source: Ambulatory Visit | Attending: Radiation Oncology | Admitting: Radiation Oncology

## 2023-01-24 VITALS — BP 104/65 | HR 68 | Temp 97.9°F | Resp 14 | Wt 135.5 lb

## 2023-01-24 DIAGNOSIS — C109 Malignant neoplasm of oropharynx, unspecified: Secondary | ICD-10-CM

## 2023-01-24 DIAGNOSIS — Z510 Encounter for antineoplastic radiation therapy: Secondary | ICD-10-CM | POA: Diagnosis not present

## 2023-01-24 DIAGNOSIS — Z5111 Encounter for antineoplastic chemotherapy: Secondary | ICD-10-CM | POA: Diagnosis not present

## 2023-01-24 DIAGNOSIS — C090 Malignant neoplasm of tonsillar fossa: Secondary | ICD-10-CM | POA: Diagnosis not present

## 2023-01-24 DIAGNOSIS — Z95828 Presence of other vascular implants and grafts: Secondary | ICD-10-CM

## 2023-01-24 LAB — RAD ONC ARIA SESSION SUMMARY
Course Elapsed Days: 45
Plan Fractions Treated to Date: 33
Plan Prescribed Dose Per Fraction: 2 Gy
Plan Total Fractions Prescribed: 35
Plan Total Prescribed Dose: 70 Gy
Reference Point Dosage Given to Date: 66 Gy
Reference Point Session Dosage Given: 2 Gy
Session Number: 33

## 2023-01-24 MED ORDER — SODIUM CHLORIDE 0.9 % IV SOLN: INTRAVENOUS | Status: DC

## 2023-01-24 MED ORDER — HEPARIN SOD (PORK) LOCK FLUSH 100 UNIT/ML IV SOLN
500.0000 [IU] | Freq: Once | INTRAVENOUS | Status: AC
  Administered 2023-01-24: 500 [IU]

## 2023-01-24 MED ORDER — SODIUM CHLORIDE 0.9% FLUSH
10.0000 mL | Freq: Once | INTRAVENOUS | Status: AC
  Administered 2023-01-24: 10 mL

## 2023-01-27 ENCOUNTER — Inpatient Hospital Stay: Payer: Medicare Other

## 2023-01-27 ENCOUNTER — Other Ambulatory Visit: Payer: Self-pay

## 2023-01-27 ENCOUNTER — Telehealth: Payer: Self-pay

## 2023-01-27 ENCOUNTER — Ambulatory Visit
Admission: RE | Admit: 2023-01-27 | Discharge: 2023-01-27 | Disposition: A | Discharge status: Home / Self Care | Payer: Medicare Other | Source: Ambulatory Visit | Attending: Radiation Oncology | Admitting: Radiation Oncology

## 2023-01-27 ENCOUNTER — Ambulatory Visit: Payer: Medicare Other

## 2023-01-27 DIAGNOSIS — C090 Malignant neoplasm of tonsillar fossa: Secondary | ICD-10-CM | POA: Diagnosis not present

## 2023-01-27 DIAGNOSIS — Z510 Encounter for antineoplastic radiation therapy: Secondary | ICD-10-CM | POA: Diagnosis not present

## 2023-01-27 DIAGNOSIS — C109 Malignant neoplasm of oropharynx, unspecified: Secondary | ICD-10-CM | POA: Diagnosis not present

## 2023-01-27 LAB — RAD ONC ARIA SESSION SUMMARY
Course Elapsed Days: 48
Plan Fractions Treated to Date: 34
Plan Prescribed Dose Per Fraction: 2 Gy
Plan Total Fractions Prescribed: 35
Plan Total Prescribed Dose: 70 Gy
Reference Point Dosage Given to Date: 68 Gy
Reference Point Session Dosage Given: 2 Gy
Session Number: 34

## 2023-01-27 NOTE — Telephone Encounter (Signed)
Left voicemail regarding missed 0830 [facility] appointment for IV fluids. Encouraged patient to call [facility] if he feels he needs IV fluids and needs to reschedule, otherwise appointment will be cancelled.

## 2023-01-28 ENCOUNTER — Other Ambulatory Visit: Payer: Self-pay

## 2023-01-28 ENCOUNTER — Ambulatory Visit
Admission: RE | Admit: 2023-01-28 | Discharge: 2023-01-28 | Disposition: A | Discharge status: Home / Self Care | Payer: Medicare Other | Source: Ambulatory Visit | Attending: Radiation Oncology | Admitting: Radiation Oncology

## 2023-01-28 DIAGNOSIS — C109 Malignant neoplasm of oropharynx, unspecified: Secondary | ICD-10-CM | POA: Diagnosis not present

## 2023-01-28 DIAGNOSIS — Z510 Encounter for antineoplastic radiation therapy: Secondary | ICD-10-CM | POA: Diagnosis not present

## 2023-01-28 DIAGNOSIS — C090 Malignant neoplasm of tonsillar fossa: Secondary | ICD-10-CM | POA: Diagnosis not present

## 2023-01-28 LAB — RAD ONC ARIA SESSION SUMMARY
Course Elapsed Days: 49
Plan Fractions Treated to Date: 35
Plan Prescribed Dose Per Fraction: 2 Gy
Plan Total Fractions Prescribed: 35
Plan Total Prescribed Dose: 70 Gy
Reference Point Dosage Given to Date: 70 Gy
Reference Point Session Dosage Given: 2 Gy
Session Number: 35

## 2023-01-28 NOTE — Progress Notes (Signed)
Oncology Nurse Navigator Documentation   Met with Alec Gibson after final RT to offer support and to celebrate end of radiation treatment.   Provided verbal post-RT guidance: Importance of keeping all follow-up appts, especially those with Nutrition and SLP. Importance of protecting treatment area from sun. Continuation of Sonafine application 2-3 times daily, application of antibiotic ointment to areas of raw skin; when supply of Sonafine exhausted transition to OTC lotion with vitamin E.  Explained my role as navigator will continue for several more months, encouraged him to call me with needs/concerns.    Harlow Asa RN, BSN, OCN Head & Neck Oncology Nurse [place] [facility] Phone # [phone]  Fax # [phone]

## 2023-01-29 ENCOUNTER — Inpatient Hospital Stay: Payer: Medicare Other | Admitting: Dietician

## 2023-01-30 NOTE — Radiation Completion Notes (Signed)
Patient Name: Alec Gibson, GIUSTINO MRN: [id number] Date of Birth: 05/27/1956 Referring Physician: [name], M.D. Date of Service: 2023-01-30 Radiation Oncologist: [name], M.D. 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, N=cN2, M=cM0 Intent: Curative     ==========DELIVERED PLANS==========  First Treatment Date: 2022-12-10 - Last Treatment Date: 2023-01-28   Plan Name: HN_Orophar Site: Oropharynx Technique: IMRT Mode: Photon Dose Per Fraction: 2 Gy Prescribed Dose (Delivered / Prescribed): 70 Gy / 70 Gy Prescribed Fxs (Delivered / Prescribed): 35 / 35     ==========ON TREATMENT VISIT DATES========== 2022-12-10, 2022-12-16, 2022-12-23, 2022-12-30, 2023-01-06, 2023-01-13, 2023-01-20, 2023-01-27     ==========UPCOMING VISITS==========       ==========APPENDIX - ON TREATMENT VISIT NOTES==========   See weekly On Treatment Notes is Epic for details.

## 2023-02-05 NOTE — Progress Notes (Addendum)
Alec Gibson presents to clinic today for follow up for radiation treatment for tonsil cancer. He completed treatment on 01-28-23.  Pain issues, if any: 6/10 Sore throat Using a feeding tube?: Yes- working well Weight changes, if any: None Swallowing issues, if any: Yes Smoking or chewing tobacco? No Using fluoride trays daily? No Last ENT visit was on: Pre diagnoses Other notable issues, if any: None  This concludes the interview.   Leandra Kern, LPN

## 2023-02-10 ENCOUNTER — Other Ambulatory Visit: Payer: Self-pay

## 2023-02-10 NOTE — Therapy (Signed)
OUTPATIENT PHYSICAL THERAPY HEAD AND NECK POST RADIATION FOLLOW UP   Patient Name: Jenri Hundt MRN: [id number] DOB:1955-12-28, 67 y.o., male Today's Date: 02/12/2023  END OF SESSION:  PT End of Session - 02/12/23 0849     Visit Number 2    Number of Visits 2    Date for PT Re-Evaluation 02/20/23    PT Start Time 0805    PT Stop Time 0825    PT Time Calculation (min) 20 min    Activity Tolerance Patient tolerated treatment well    Behavior During Therapy [facility] for tasks assessed/performed             Past Medical History:  Diagnosis Date   Anxiety    Depression    Thyroid disease    Past Surgical History:  Procedure Laterality Date   IR GASTROSTOMY TUBE MOD SED  12/30/2022   IR IMAGING GUIDED PORT INSERTION  12/30/2022   IR NASO G TUBE PLC W/FL W/RAD  12/30/2022   Patient Active Problem List   Diagnosis Date Noted   Port-A-Cath in place 01/03/2023   Syncope 12/13/2022   Cancer of tonsillar fossa ([place]) 11/22/2022   Encounter for fitting and adjustment of dental prosthetic device 11/19/2022   Teeth missing 11/19/2022   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   Accretions on teeth 11/19/2022   Gingival recession, generalized 11/19/2022   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   Attrition, teeth excessive 11/19/2022   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 My throat still feels inflamed but my skin is getting better. My ROM is fine. I still have discomfort on  either side of my throat. I fell again about a week ago when I got out of bed too fast and I got light headed and I fell in the bathroom and hit my head on the sink.   PERTINENT HISTORY:  SCC of the Right Tonsil, stage III (T4N2M0, p 16 +), presented to his PCP on 10/23/22 with stabbing right ear pain for one month and a swollen lymph node to the right side of his neck present for several months. He also  reported that the ear pain radiated to beneath his jaw. He was urgently referred to ENT. 10/23/22 (same day as PCP visit) He saw Dr. Sabino Gasser who performed a flexible laryngoscopy which revealed at firm tumor based on the right palatine tonsil with extension through the soft palate into the right eustachian tube. 10/30/22 CT neck was completed revealing a solid mass lesion centered in the right palatine tonsil measuring 3.5 x 2.3 cm, with effacement of the adjacent parapharyngeal fat, right glossotonsillar sulcus, and likely posteromedial extent along the posterior oropharyngeal wall. CT also showed a morphologically suspicious right level IB lymph node measuring 1.4 cm, and additional suspected level II pathologic lymphadenopathy with short-segment occlusion of the right internal jugular vein in the mid neck. 11/07/22 Biopsy of the right oropharyngeal mass showed high-grade invasive SCC, p 16 +. He will receive 35 fractions of radiation to his oropharynx and bilateral neck with weekly cisplatin. He started on 12/10/22 and will complete 01/27/23. PEG/PAC planned for 1/22 (placement was attempted on 1/5 but he developed low BP and HR.   PATIENT GOALS:  Reassess how my recovery is going related to neck ROM, cervical pain, fatigue, and swelling.  PAIN:  Are you having pain? Yes NPRS scale: 6/10 Pain location: tongue and throat Pain orientation: Right  PAIN TYPE: aching, dull, and sharp Pain description: intermittent  Aggravating factors: talking, swallowing Relieving factors: nothing  PRECAUTIONS: Recent  radiation, Head and neck lymphedema risk, Other: possible orthostatic hypotension   OBJECTIVE:   POSTURE:  Forward head and rounded shoulders posture   30 SEC SIT TO STAND: At eval: Not performed today due to possible orthostatic hypotension and pt passed out this morning and fell in his bathroom - he may receive IV fluids today, his doctor will assess after this appt 02/12/23: still not completed due to orthostatic hypotension - pt reports he raked his yard and walked his dog yesterday. He reports his strength and stamina are coming back but he does tire quickly.    SHOULDER AROM:   [facility]     CERVICAL AROM:     Percent limited 02/12/23  Flexion WFL WFL  Extension [facility] WFL  Right lateral flexion [facility] WFL  Left lateral flexion WFL WFL  Right rotation [facility] WFL  Left rotation WFL WFL                          (Blank rows=not tested)    LYMPHEDEMA ASSESSMENT:    Circumference in cm  4 cm superior to sternal notch around neck 35.5  6 cm superior to sternal notch around neck 35.7  8 cm superior to sternal notch around neck 36  R lateral nostril from base of nose to medial tragus   L lateral nostril from base of nose to medial tragus   R corner of mouth to where ear lobe meets face   L corner of mouth to where ear lobe meets face         (Blank rows=not tested)  CURRENT/PAST TREATMENTS:  Chemotherapy: completed 01/27/23  Radiation:completed 01/27/23  OTHER SYMPTOMS: Pain Yes Fibrosis No Pitting edema No Infections No   PATIENT EDUCATION:  Education details: walking program, lymphedema Person educated: Patient Education method: Explanation and Handouts Education comprehension: verbalized understanding  HOME EXERCISE PROGRAM: Reviewed previously given post op HEP. Continue walking program  ASSESSMENT:  CLINICAL IMPRESSION: Pt returns to PT after undergoing chemo and radiation for treatment of SCC of oropharynx. He reports his biggest  issues are thick saliva, dry mouth  and pain in his throat which is preventing him from eating or drinking. He is still using his feeding tube. His neck ROM is at baseline. He is walking daily around 30 min and able to do his normal activities. He was educated to look for any signs or symptoms of lymphedema and let his doctor know as soon as possible. He will be discharged from skilled PT services at this time.     PT treatment/interventions: ADL/Self care home management,    GOALS Name Target Date  Goal status  1 Pt will demonstrate a return to baseline cervical ROM measurements and not demonstrate any signs or symptoms of lymphedema. Baseline: 02/12/23  MET  '2     3     4        '$ GOALS: Goals reviewed with patient? Yes  LONG TERM GOALS:  (STG=LTG)   PLAN:  PT FREQUENCY/DURATION: d/c this visit  PLAN FOR NEXT SESSION: d/c this visit   [facility]  [address]  [place] [address]  [phone]   Scar massage You can begin gentle scar massage to you incision sites. Gently place one hand on the incision and move the skin (without sliding on the skin) in various directions. Do this for a few minutes and then you can gently massage either coconut oil or vitamin E cream into the scars.  Home exercise Program Continue doing the exercises you were given until you feel like you can do them without feeling any tightness at the end. It is best to do them for several months after completion of radiation since the effects of radiation continue past completion.   Walking Program Studies show that 30 minutes of walking per day (fast enough to elevate your heart rate) can significantly reduce the risk of a cancer recurrence. If you can't walk due to other medical reasons, we encourage you to find another activity you could do (like a stationary bike or water exercise).  Posture After treatment for head and neck cancer, people frequently sit with rounded shoulders and forward head  posture because the front of the neck has become tight and it feels better. If you sit like this, you can become very tight and have pain in sitting or standing with good posture. Try to be aware of your posture and sit and stand up tall to heal properly.  Follow up PT: Please let you doctor know as soon as possible if you develop any swelling in your face or neck in the future. Lymphedema (swelling) can occur months after completion of radiation. The sooner you can let the doctor know, the sooner they can refer you back to PT. It is much easier to treat the swelling early on.   [facility] [place], PT 02/12/2023, 8:50 AM   PHYSICAL THERAPY DISCHARGE SUMMARY  Visits from Start of Care: 2  Current functional level related to goals / functional outcomes: All goals met   Remaining deficits: None   Education / Equipment: HEP, walking program, lymphedema   Patient agrees to discharge. Patient goals were met. Patient is being discharged due to meeting the stated rehab goals.  [facility] [place], [place] 02/12/23 8:50 AM

## 2023-02-11 ENCOUNTER — Inpatient Hospital Stay: Payer: Medicare Other | Attending: Hematology and Oncology | Admitting: Dietician

## 2023-02-11 NOTE — Progress Notes (Signed)
Nutrition Follow-up:  Patient has completed chemoradiation for SCC of oropharynx. Final radiation 01/28/23  Spoke with patient via telephone. He reports feeling some better with each day. Patient reports morning mouth soreness that improves throughout the day. Patient states ongoing mild tongue swelling as well as sore throat. He is not eating or drinking orally. Patient reports taking small sips of water. He is giving 4-5 cartons of Osmolite 1.5 "depending on what he has going on" and reports 750 ml water at each feeding. Diarrhea has resolved. Patient reports formed bowel movements every other day. Sometimes these are hard.    Medications: reviewed   Labs: no new labs for review  Anthropometrics: Last wt 135 lb 4.8 oz on 2/13 - maintaining per pt    NUTRITION DIAGNOSIS: Unintended wt loss - addressing with TF   INTERVENTION:  Continue 5 cartons Osmolite 1.5 daily Continue baking soda salt water rinses several times daily Encouraged HEP as prescribed by ST - reminded pt of 3/6 appointments    MONITORING, EVALUATION, GOAL: weight trends, intake    NEXT VISIT: Wednesday March 20 via telephone

## 2023-02-12 ENCOUNTER — Ambulatory Visit: Payer: Medicare Other | Attending: Radiation Oncology

## 2023-02-12 ENCOUNTER — Ambulatory Visit
Admission: RE | Admit: 2023-02-12 | Discharge: 2023-02-12 | Disposition: A | Discharge status: Home / Self Care | Payer: Medicare Other | Source: Ambulatory Visit | Attending: Radiation Oncology | Admitting: Radiation Oncology

## 2023-02-12 ENCOUNTER — Encounter: Payer: Self-pay | Admitting: Radiation Oncology

## 2023-02-12 ENCOUNTER — Ambulatory Visit: Payer: Medicare Other | Attending: Radiation Oncology | Admitting: Physical Therapy

## 2023-02-12 ENCOUNTER — Encounter: Payer: Self-pay | Admitting: Physical Therapy

## 2023-02-12 VITALS — BP 110/76 | HR 81 | Temp 98.1°F | Resp 19 | Ht 69.0 in | Wt 136.0 lb

## 2023-02-12 DIAGNOSIS — Z79899 Other long term (current) drug therapy: Secondary | ICD-10-CM | POA: Insufficient documentation

## 2023-02-12 DIAGNOSIS — C109 Malignant neoplasm of oropharynx, unspecified: Secondary | ICD-10-CM | POA: Diagnosis present

## 2023-02-12 DIAGNOSIS — R1310 Dysphagia, unspecified: Secondary | ICD-10-CM | POA: Diagnosis present

## 2023-02-12 DIAGNOSIS — Z923 Personal history of irradiation: Secondary | ICD-10-CM | POA: Insufficient documentation

## 2023-02-12 DIAGNOSIS — R293 Abnormal posture: Secondary | ICD-10-CM | POA: Diagnosis present

## 2023-02-12 DIAGNOSIS — C090 Malignant neoplasm of tonsillar fossa: Secondary | ICD-10-CM

## 2023-02-12 NOTE — Therapy (Signed)
OUTPATIENT SPEECH LANGUAGE PATHOLOGY ONCOLOGY TREATMENT   Patient Name: Alec Gibson MRN: [id number] DOB:November 02, 1956, 67 y.o., male Today's Date: 02/12/2023  PCP: None in Chart REFERRING PROVIDER: [name], MD  END OF SESSION:  End of Session - 02/12/23 1405     Visit Number 3    Number of Visits 4    Date for SLP Re-Evaluation 03/12/23    SLP Start Time 0935    SLP Stop Time  [id number]    SLP Time Calculation (min) 40 min    Activity Tolerance Patient tolerated treatment well               Past Medical History:  Diagnosis Date   Anxiety    Depression    Thyroid disease    Past Surgical History:  Procedure Laterality Date   IR GASTROSTOMY TUBE MOD SED  12/30/2022   IR IMAGING GUIDED PORT INSERTION  12/30/2022   IR NASO G TUBE PLC W/FL W/RAD  12/30/2022   Patient Active Problem List   Diagnosis Date Noted   Port-A-Cath in place 01/03/2023   Syncope 12/13/2022   Cancer of tonsillar fossa ([place]) 11/22/2022   Encounter for fitting and adjustment of dental prosthetic device 11/19/2022   Teeth missing 11/19/2022   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 I spend about 2 hours a day in front of the sink." Pt accompanied by: self  PERTINENT HISTORY:  SCC of the Right Tonsil, stage III (T4N2M0, p 16 +) He presented to his PCP on 10/23/22 with stabbing right ear pain for one month and a swollen lymph node to the right side of his neck present for several months. He  also reported that the ear pain radiated to beneath his jaw. He was urgently referred to ENT. 10/23/22 (same day as PCP visit) He saw Dr. Sabino Gasser who performed a flexible laryngoscopy which revealed at firm tumor based on the right palatine tonsil with extension through the soft palate into the right eustachian tube. 10/30/22 CT neck was completed revealing  a solid mass lesion centered in the right palatine tonsil measuring 3.5 x 2.3 cm, with effacement of the adjacent parapharyngeal fat, right glossotonsillar sulcus, and likely posteromedial extent along the posterior oropharyngeal wall. CT also showed a morphologically suspicious right level IB lymph node measuring 1.4 cm, and additional suspected level II pathologic lymphadenopathy with short-segment occlusion of the right internal jugular vein in the mid neck. 11/07/22 Biopsy of the right oropharyngeal mass showed high-grade invasive SCC, p 16 +.11/21/22 PET shows a large right oropharyngeal mass and bilateral multistation hypermetabolic lymphadenopathy, no distant metastatic disease. He will receive chemotherapy/radiation. Treatment plan:  He will receive 35 fractions of radiation to his oropharynx and bilateral neck with weekly cisplatin. He started on 12/10/22 and will complete 01/27/23. Pretreatment procedures -PEG/PAC placed 12/13/22  PAIN:  Are you having pain? Yes: NPRS scale: 6/10 Pain location: lt throat Pain description: soreness Aggravating factors: swallowing Alleviating: medication  F PATIENT GOALS: Maintain WNL swallowing  OBJECTIVE:    TODAY'S TREATMENT:  DATE:  02/12/23: Cont to use PEG, minimal water each day. Suboptimal completion of HEP last 3 weeks. Pt req'd cues for [facility] but was independent by session end. SLP reiterated pt could do chin tuck against resistance with fist and shared that pt  should do 1-3 reps of each exericse as many times/day as he can, working up to at [employer] 5 reps with a total of at least 20/day for each - by next session.  SLP provided ideas for dys I foods pt could try and suggested gravies/sauces to aid in pharyngeal transit. Pt coughed with larger sip H2O so SLP cautioned pt to take half sips. When he did this x6 - no coughing occurred. SLP educated pt re: food journal and overt s/sx aspiration PNA.  01/09/23: Pt is using PEG solely, but having some H2O each day. SLP congratulated pt on this. Thickened saliva and mucositis/mouth sores make swallowing and completing HEP more challenging. SLP encouraged pt to cycle through the exercises at this time and do as many cycles as he can, then incr reps as pt is able to do so after chemo/rad tx ends.  Today, pt reported completing HEP at suboptimal frequency and had to look at the HEP to read directions for each exercise prior to completing reps. He req'd occasional min A for procedure. He told SLP rationale for completion with independence. With water sips during  HEP pt reported nasal regurgitation 2/10 sips. SLP encouraged pt to make change to sip size and postural changes with neck to mitigate this. Pt did NOT report this occurring with sips of water throughout the day. SLP postulates that regurgitation occurs due to procedure from HEP.  12/12/22 (eval): Research states the risk for dysphagia increases due to radiation and/or chemotherapy treatment due to a variety of factors, so SLP educated the pt about the possibility of reduced/limited ability for PO intake during rad tx. SLP also educated pt regarding possible changes to swallowing musculature after rad tx, and why adherence to dysphagia HEP provided today and PO consumption was necessary to inhibit muscle fibrosis following rad tx and to mitigate muscle disuse atrophy. SLP informed pt why this would be detrimental to their swallowing status and to their pulmonary health. Pt  demonstrated understanding of these things to SLP. SLP encouraged pt to safely eat and drink as deep into their radiation/chemotherapy as possible to provide the best possible long-term swallowing outcome for pt.  SLP then developed an individualized HEP for pt involving oral and pharyngeal strengthening and ROM and pt was instructed how to perform these exercises, including SLP demonstration. After SLP demonstration, pt return demonstrated each exercise. SLP ensured pt performance was correct prior to educating pt on next exercise. Pt required occasional min-mod cues faded to modified independent to perform HEP. Pt was instructed to complete this program at least 6 days/week, at least 2 times a day until 6 months after his or her last day of rad tx and then x2 a week after that, indefinitely. Among other modifications for days when pt cannot functionally swallow, SLP also suggested pt  cycle through the swallowing portion so the full program of exercises can be completed instead of fatiguing on one of the swallowing exercises and being unable to perform the other swallowing exercises. SLP instructed that swallowing exercise reps should then be increased back into the regimen as pt is able to do so. Secondly, pt was told that former patients have told SLP that during their course of radiation therapy, taking  prescribed pain medication just prior to performing HEP (and eating/drinking) has proven helpful in completing HEP (and eating and drinking) more regularly when going through their course of radiation treatment.    PATIENT EDUCATION: Education details: HEP procedure and modification to HEP when difficulty experienced with swallowing during and after radiation course Person educated: Patient Education method: Explanation, Demonstration, and Verbal cues Education comprehension: verbalized understanding, returned demonstration, verbal cues required, and needs further education   ASSESSMENT:  CLINICAL  IMPRESSION: Patient is a 67 y.o. male who was seen today for treatment of swallowing as they undergo radiation/chemoradiation therapy. Today pt drank thin liquids with ocugh when he took WNL size sip. With "half sips", pt was without overt s/s oral or pharyngeal difficulty. At this time pt swallowing is deemed WNL/WFL with these POs. No oral or overt s/sx pharyngeal deficits, including aspiration were observed. There are no overt s/s aspiration PNA observed by SLP nor any reported by pt at this time. Data indicate that pt's swallow ability will likely decrease over the course of radiation/chemoradiation therapy and could very well decline over time following the conclusion of that therapy due to muscle disuse atrophy and/or muscle fibrosis. Pt will cont to need to be seen by SLP in order to assess safety of PO intake, assess the need for recommending any objective swallow assessment, and ensuring pt is correctly completing the individualized HEP.  OBJECTIVE IMPAIRMENTS: include dysphagia. These impairments are limiting patient from safety when swallowing. Factors affecting potential to achieve goals and functional outcome are  none noted today . Patient will benefit from skilled SLP services to address above impairments and improve overall function.  REHAB POTENTIAL: Good   GOALS: Goals reviewed with patient? Yes  SHORT TERM GOALS: Target: 3rd total session   Pt will compelte HEP with modified independence in 2 sessions Baseline: Goal status: Ongoing   2.  pt will tell SLP why pt is completing HEP with modified independence Baseline:  Goal status: Met   3.  pt will describe 3 overt s/s aspiration PNA with modified independence Baseline:  Goal status: Met   4.  pt will tell SLP how a food journal could hasten return to a more normalized diet Baseline:  Goal status: Met     LONG TERM GOALS: Target: 7th total session   1.  pt will complete HEP with independence over two  visits Baseline:  Goal status: Ongoing   2.  pt will describe how to modify HEP over time, and the timeline associated with reduction in HEP frequency with modified independence over two sessions Baseline:  Goal status: Ongoing  PLAN:  SLP FREQUENCY:  once approx every 4 weeks  SLP DURATION:  6 sessions  PLANNED INTERVENTIONS: Aspiration precaution training, Pharyngeal strengthening exercises, Diet toleration management , Environmental controls, Trials of upgraded texture/liquids, Cueing hierachy, Internal/external aids, SLP instruction and feedback, Compensatory strategies, and Patient/family education    [facility], [place] 02/12/2023, 2:10 PM

## 2023-02-12 NOTE — Progress Notes (Signed)
Radiation Oncology         [phone] ________________________________  Name: Alec Gibson MRN: [id number]  Date: 02/12/2023  DOB: [birth year]  Follow-Up Visit Note  CC: Alec Booze, NP  Alec Downer, MD  Diagnosis and Prior Radiotherapy:       ICD-10-CM   1. 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  He completed radiation treatment on 01/28/23.   Patient received 70 Gy in 35 fractions to the oropharynx. Patient received IMRT from 12/10/22 - 01/28/23.  Pain issues, if any: 6/10 Sore throat. Reports pain has been improving since completing XRT.  Using a feeding tube?: Yes- working well Weight changes, if any: Weight has been stable. Patient is 135lbs today  01/21/23 01/14/23 01/07/23  Weight 135 lb 4.8 oz (61.4 kg) 135 lb 1.6 oz (61.3 kg) 136 lb 4.8 oz (61.8 kg)  Height '5\' 9"'$  (1.753 m) '5\' 9"'$  (1.753 m) '5\' 9"'$  (1.753 m)   Swallowing issues, if any: Yes, patient is only swallowing water. He is regularly seeing SLP. Patient states he is going to try soft fruit soon.  Smoking or chewing tobacco? No Using fluoride trays daily? No Last ENT visit was on: Pre diagnoses Other notable issues, if any: None  ALLERGIES:  is allergic to sulfa antibiotics.  Meds: Current Outpatient Medications  Medication Sig Dispense Refill   Cholecalciferol (VITAMIN D-3 PO) Take 1 capsule by mouth in the morning.     clonazePAM (KLONOPIN) 1 MG tablet Take 1 mg by mouth 2 (two) times daily as needed for anxiety (sleep).     dexamethasone  (DECADRON) 4 MG tablet Take 2 tablets daily x 3 days starting the day after cisplatin chemotherapy. Take with food. (Patient not taking: Reported on 12/13/2022) 30 tablet 1   HYDROcodone-acetaminophen (NORCO/VICODIN) 5-325 MG tablet Take 1 tablet by mouth every 6 (six) hours as needed for moderate pain. 30 tablet 0   levocetirizine (XYZAL) 5 MG tablet Take 5 mg by mouth in the morning.     levothyroxine (SYNTHROID) 175 MCG tablet Take 1 tablet (175 mcg total) by mouth daily before breakfast. 30 tablet 0   lidocaine (XYLOCAINE) 2 % solution Patient: Mix 1part 2% viscous lidocaine, 1part H20. Swish & swallow 75m of diluted mixture, 348m before meals and at bedtime, up to QID 200 mL 3   lidocaine-prilocaine (EMLA) cream Apply to affected area once (Patient not taking: Reported on 12/13/2022) 30 g 3   Multiple Vitamin (MULTIVITAMIN) tablet Take 1 tablet by mouth in the morning.     Multiple Vitamins-Minerals (ZINC PO) Take 1 tablet by mouth in the morning.     Omega-3 Fatty Acids (FISH OIL PO) Take 1 capsule by mouth in the morning.     ondansetron (ZOFRAN) 8 MG tablet Take 1 tablet (8 mg total) by mouth every 8 (eight) hours as needed for nausea or vomiting. Start on the third day after cisplatin. 30 tablet 1   prochlorperazine (COMPAZINE) 10 MG tablet Take 1 tablet (10 mg total) by mouth  every 6 (six) hours as needed (Nausea or vomiting). (Patient not taking: Reported on 12/13/2022) 30 tablet 1   VITAMIN E PO Take 1 capsule by mouth in the morning.     No current facility-administered medications for this encounter.    Physical Findings: The patient is in no acute distress. Patient is alert and oriented. Wt Readings from Last 3 Encounters:  02/12/23 136 lb (61.7 kg)  01/24/23 135 lb 8 oz (61.5 kg)  01/21/23 135 lb 4.8 oz (61.4 kg)    height is '5\' 9"'$  (1.753 m) and weight is 136 lb (61.7 kg). His temporal temperature is 98.1 F (36.7 C). His blood pressure is 110/76 and his pulse is 81. His  respiration is 19 and oxygen saturation is 99%. .  General: Alert and oriented, in no acute distress HEENT: Head is normocephalic. Extraocular movements are intact. Oropharynx is notable for mucous in the posterior pharynx.  Resolving confluent mucositis.  No concerning masses or lesions visualized.  Uvula is deviated to the right, stable anatomic changes.  Tongue deviates a little bit to the right Neck: No masses or lesions palpated in the cervical, submandibular, or supraclavicular regions.   Skin: Skin in treatment fields shows satisfactory healing. Hyperpigmentation and erythema present in the anterior neck, consistent with radiation changes.  Heart: Regular in rate and rhythm with no murmurs, rubs, or gallops. Chest: Clear to auscultation bilaterally, with no rhonchi, wheezes, or rales. Abdomen: Soft, nontender, nondistended, with no rigidity or guarding. Extremities: No cyanosis or edema. Lymphatics: see Neck Exam Psychiatric: Judgment and insight are intact. Affect is appropriate.   Lab Findings: Lab Results  Component Value Date   WBC 2.9 (L) 01/21/2023   HGB 11.2 (L) 01/21/2023   HCT 32.1 (L) 01/21/2023   MCV 93.6 01/21/2023   PLT 129 (L) 01/21/2023    Lab Results  Component Value Date   TSH 33.847 (H) 12/13/2022    Radiographic Findings: No results found.  Impression/Plan:  Follow-up for cancer of the tonsillar fossa.   1) Head and Neck Cancer Status: Patient is recovering well from side effects of radiation. I am very pleased with his progress this close from finishing.   2) Nutritional Status: Weight has remained stable.  PEG tube: Completely dependent.  Swallowing water.  Will start to escalate diet as tolerated  3) Risk Factors: The patient has been educated about risk factors including alcohol and tobacco abuse; they understand that avoidance of alcohol and tobacco is important to prevent recurrences as well as other cancers  4) Swallowing: Patient continues to  see SLP. He only swallowing water at this point but is going to start introducing soft foods into his diet.   5) Dental: Encouraged to continue regular followup with dentistry, and dental hygiene including fluoride rinses.   6) Thyroid function:  Lab Results  Component Value Date   TSH 33.847 (H) 12/13/2022  Taking 175 mcg of synthroid  7) Other: Patient has not yet been seen by medical oncology since completing treatment. We are scheduling him to be seen by Dr. Chryl Heck.   8) We will get a follow up PET scan in late May and I will see patient 1-2 days after to review results. The patient was encouraged to call with any issues or questions before then.  On date of service, in total, I spent 30 minutes on this encounter. Patient was seen in person. _____________________________________   Leona Singleton, PA   Alec Gibson, MD

## 2023-02-12 NOTE — Patient Instructions (Signed)
   Signs of Aspiration Pneumonia   Chest pain/tightness Fever (can be low grade) Cough  With foul-smelling phlegm (sputum) With sputum containing pus or blood With greenish sputum Fatigue  Shortness of breath  Wheezing   **IF YOU HAVE THESE SIGNS, CONTACT YOUR DOCTOR OR GO TO THE EMERGENCY DEPARTMENT OR URGENT CARE AS SOON AS POSSIBLE**     

## 2023-02-13 ENCOUNTER — Other Ambulatory Visit: Payer: Self-pay

## 2023-02-13 DIAGNOSIS — C090 Malignant neoplasm of tonsillar fossa: Secondary | ICD-10-CM

## 2023-02-18 ENCOUNTER — Telehealth: Payer: Self-pay | Admitting: Hematology and Oncology

## 2023-02-18 NOTE — Telephone Encounter (Signed)
Spoke with patient confirming upcoming appointments  

## 2023-02-21 ENCOUNTER — Other Ambulatory Visit (HOSPITAL_COMMUNITY): Payer: 59 | Admitting: Dentistry

## 2023-02-26 ENCOUNTER — Inpatient Hospital Stay: Payer: Medicare Other | Admitting: Dietician

## 2023-02-26 ENCOUNTER — Telehealth: Payer: Self-pay | Admitting: Dietician

## 2023-02-26 NOTE — Telephone Encounter (Signed)
Nutrition Follow-up:  Patient has completed chemoradiation for SCC of oropharynx. Final radiation 01/28/23   Spoke with patient via telephone. He reports ongoing excess mucous production and dry mouth. Patient states he continues to have swollen tongue. He has mouth sores. Patient unable to tolerate baking water salt water rinses. This burns horribly. Patient says he has been completing HEP exercises. He is not eating/drinking orally at this time. Patient reports "getting choked" on water at 3/6 rehab. He does not feel comfortable trying any food. Patient reports giving 4-5 cartons of Osmolite 1.5. He reports giving 4 (16 ounce) bottles of water via tube daily. Patient reports he has one box of formula left. States that company has not sent anymore.    Medications: reviewed   Labs: no new labs  Anthropometrics: Wt 136 lb on 3/6 stable   2/13 - 135 lb 4.8 oz    NUTRITION DIAGNOSIS: Unintended weight loss - stable with TF   INTERVENTION:  Continue 5 cartons Osmolite 1.5 via tube  Reminded patient that he needs to contact [employer] to receive monthly shipment of formula and supplies. Contact number provided, educated to choose option one Continue completing HEP exercises and working with ST to advance diet Encouraged small sips of water orally as tolerated    MONITORING, EVALUATION, GOAL: weight trends, intake    NEXT VISIT: Friday April 5 after MD

## 2023-03-11 ENCOUNTER — Telehealth: Payer: Self-pay

## 2023-03-11 ENCOUNTER — Ambulatory Visit: Payer: Medicare Other | Attending: Radiation Oncology

## 2023-03-11 ENCOUNTER — Other Ambulatory Visit: Payer: Self-pay | Admitting: Radiation Oncology

## 2023-03-11 DIAGNOSIS — R1310 Dysphagia, unspecified: Secondary | ICD-10-CM

## 2023-03-11 DIAGNOSIS — C090 Malignant neoplasm of tonsillar fossa: Secondary | ICD-10-CM

## 2023-03-11 MED ORDER — LIDOCAINE VISCOUS HCL 2 % MT SOLN: OROMUCOSAL | 3 refills | Status: DC

## 2023-03-11 NOTE — Telephone Encounter (Signed)
RN Anderson Malta called pt back to inform him that his prescription for the Lidocaine solution had been refilled by Dr. Sondra Come. Pt was very happy with this quick refill process by our staff. RN encouraged pt to call with any other needs or concerns.

## 2023-03-11 NOTE — Telephone Encounter (Signed)
RN Alec Gibson called pt back after he had left a message. On return call he stated he needed his Lidocaine solution refilled. Rn Alec Gibson called [facility] in [place] and they do have the medication available. He stated he continues to have issues with mouth sores, dryness, and thick saliva. He stated he has a scab towards the back of his mouth he continues to pull off (I advised against that). He stated he still does the mouth rinses but has had to modify the amount of salt used. He also noted that he feels like his uvula has disappeared as well. He continues to use his peg tube for feeds five times a day with 16 oz of water with each can. Rn encouraged pt to continue to use mouth rinses as much as possible and to look into biotene as well for mouth dryness. Pt also mentioned needed to extend his FMLA, RN Alec Gibson sent an in basket to Alec Gibson for this need. Dr. Sondra Gibson sent message of this need for medication refill and Rn will call pt when this has been either called in or sent electronically.

## 2023-03-11 NOTE — Therapy (Signed)
OUTPATIENT SPEECH LANGUAGE PATHOLOGY ONCOLOGY TREATMENT/RECERTIFICATION   Patient Name: Alec Gibson MRN: [id number] DOB:05-May-1956, 67 y.o., male Today's Date: 03/11/2023  PCP: None in Chart REFERRING PROVIDER: [name], MD  END OF SESSION:  End of Session - 03/11/23 0806     Visit Number 4    Number of Visits 7    Date for SLP Re-Evaluation 06/09/23    SLP Start Time 0805    SLP Stop Time  0845    SLP Time Calculation (min) 40 min    Activity Tolerance Patient limited by pain   better at end of session due to frequent hydration during ST session              Past Medical History:  Diagnosis Date   Anxiety    Depression    Thyroid disease    Past Surgical History:  Procedure Laterality Date   IR GASTROSTOMY TUBE MOD SED  12/30/2022   IR IMAGING GUIDED PORT INSERTION  12/30/2022   IR NASO G TUBE PLC W/FL W/RAD  12/30/2022   Patient Active Problem List   Diagnosis Date Noted   Port-A-Cath in place 01/03/2023   Syncope 12/13/2022   Cancer of tonsillar fossa 11/22/2022   Encounter for fitting and adjustment of dental prosthetic device 11/19/2022   Teeth missing 11/19/2022   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   Accretions on teeth 11/19/2022   Gingival recession, generalized 11/19/2022   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   Attrition, teeth excessive 11/19/2022   Squamous cell carcinoma of oropharynx 11/14/2022    ONSET DATE: See "pertinent history" below   REFERRING DIAG: C10.9 (ICD-10-CM) - Squamous cell carcinoma of oropharynx ([place])   THERAPY DIAG:  Dysphagia, unspecified type  Rationale for Evaluation and Treatment: Rehabilitation  SUBJECTIVE:   SUBJECTIVE STATEMENT: "I have so much pain - it burns when I swallow." Pt accompanied by: self  PERTINENT HISTORY:  SCC of the Right Tonsil, stage III (T4N2M0, p 16 +) He presented to his PCP on 10/23/22 with stabbing right ear pain for one month and a swollen lymph node to the right side of his neck  present for several months. He also reported that the ear pain radiated to beneath his jaw. He was urgently referred to ENT. 10/23/22 (same day as PCP visit) He saw Dr. Sabino Gasser who performed a flexible laryngoscopy which revealed at firm tumor based on the right palatine tonsil with extension through the soft palate into the right eustachian tube. 10/30/22 CT neck was completed revealing  a solid mass lesion centered in the right palatine tonsil measuring 3.5 x 2.3 cm, with effacement of the adjacent parapharyngeal fat, right glossotonsillar sulcus, and likely posteromedial extent along the posterior oropharyngeal wall. CT also showed a morphologically suspicious right level IB lymph node measuring 1.4 cm, and additional suspected level II pathologic lymphadenopathy with short-segment occlusion of the right internal jugular vein in the mid neck. 11/07/22 Biopsy of the right oropharyngeal mass showed high-grade invasive SCC, p 16 +.11/21/22 PET shows a large right oropharyngeal mass and bilateral multistation hypermetabolic lymphadenopathy, no distant metastatic disease. He will receive chemotherapy/radiation. Treatment plan:  He will receive 35 fractions of radiation to his oropharynx and bilateral neck with weekly cisplatin. He started on 12/10/22 and will complete 01/27/23. Pretreatment procedures -PEG/PAC placed 12/13/22  PAIN:  Are you having pain? No, unless swallowing NPRS scale: 8/10, decreases with subsequent swallows of warm water Pain location: back of throat Pain description: burning, soreness Aggravating factors: swallowing Alleviating: medication  PATIENT  GOALS: Maintain WNL swallowing  OBJECTIVE:    TODAY'S TREATMENT:                                                                                                                                         DATE:  03/11/23: Pt cont PEG dependent, and cont with hydrocodone regularly. Today he also c/o intense pain when mouth is dry and he attempts to  swallow (8/10). Jwaun has less pain the more water he drinks, reducing to approx 2-3/10. He reports he then waits until he feels pain and then drinks more. SLP told pt to drink 4-5 3/4 t sips of water every hour. Keyshun swishes and expectorates and he says this begins to tear any healing tissue from his oral structures. SLP told pt to go between a gentle swish and tilt of his head instead of  a hard swish - pt demo'd understanding. SLP educated pt that more regular water/moisture will assist healing of oral structures and should decr his overall pain and that other patients have told SLP coconut oil has helped retain moisture given xerostomia. SLP had pt eat applesauce and pt stated he could feel only minimal residue in pharynx which cleared with 3/4 t warm water rinse. SLP encouraged pt to eat pureed x3/day with liquid wash if necessary. No overt s/sx oral or pharyngeal deficits today. Pt has not been completing HEP to recommended frequency due to pain. Because pain was reduced to 2-3/10 SLP reviewed pt's HEP with him and he req'd min A with tongue protrusion on Masako. SLP strongly encouraged pt to complete at LEAST 20 reps of effortful, masako, mendelsohn, and supraglottic, with 6 reps of Shaker per day, more if possible. SLP shared food journal idea with pt.  02/12/23: Cont to use PEG, minimal water each day. Suboptimal completion of HEP last 3 weeks. Pt req'd cues for [facility] but was independent by session end. SLP reiterated pt could do chin tuck against resistance with fist and shared that pt should do 1-3 reps of each exericse as many times/day as he can, working up to at [employer] 5 reps with a total of at least 20/day for each - by next session.  SLP provided ideas for dys I foods pt could try and suggested gravies/sauces to aid in pharyngeal transit. Pt coughed with larger sip H2O so SLP cautioned pt to take half sips. When he did this x6 - no coughing occurred. SLP educated pt re: food journal and overt  s/sx aspiration PNA.  01/09/23: Pt is using PEG solely, but having some H2O each day. SLP congratulated pt on this. Thickened saliva and mucositis/mouth sores make swallowing and completing HEP more challenging. SLP encouraged pt to cycle through the exercises at this time and do as many cycles as he can, then incr reps as pt is able to do so after chemo/rad tx ends.  Today, pt reported completing  HEP at suboptimal frequency and had to look at the HEP to read directions for each exercise prior to completing reps. He req'd occasional min A for procedure. He told SLP rationale for completion with independence. With water sips during  HEP pt reported nasal regurgitation 2/10 sips. SLP encouraged pt to make change to sip size and postural changes with neck to mitigate this. Pt did NOT report this occurring with sips of water throughout the day. SLP postulates that regurgitation occurs due to procedure from HEP.  12/12/22 (eval): Research states the risk for dysphagia increases due to radiation and/or chemotherapy treatment due to a variety of factors, so SLP educated the pt about the possibility of reduced/limited ability for PO intake during rad tx. SLP also educated pt regarding possible changes to swallowing musculature after rad tx, and why adherence to dysphagia HEP provided today and PO consumption was necessary to inhibit muscle fibrosis following rad tx and to mitigate muscle disuse atrophy. SLP informed pt why this would be detrimental to their swallowing status and to their pulmonary health. Pt demonstrated understanding of these things to SLP. SLP encouraged pt to safely eat and drink as deep into their radiation/chemotherapy as possible to provide the best possible long-term swallowing outcome for pt.  SLP then developed an individualized HEP for pt involving oral and pharyngeal strengthening and ROM and pt was instructed how to perform these exercises, including SLP demonstration. After SLP  demonstration, pt return demonstrated each exercise. SLP ensured pt performance was correct prior to educating pt on next exercise. Pt required occasional min-mod cues faded to modified independent to perform HEP. Pt was instructed to complete this program at least 6 days/week, at least 2 times a day until 6 months after his or her last day of rad tx and then x2 a week after that, indefinitely. Among other modifications for days when pt cannot functionally swallow, SLP also suggested pt  cycle through the swallowing portion so the full program of exercises can be completed instead of fatiguing on one of the swallowing exercises and being unable to perform the other swallowing exercises. SLP instructed that swallowing exercise reps should then be increased back into the regimen as pt is able to do so. Secondly, pt was told that former patients have told SLP that during their course of radiation therapy, taking prescribed pain medication just prior to performing HEP (and eating/drinking) has proven helpful in completing HEP (and eating and drinking) more regularly when going through their course of radiation treatment.    PATIENT EDUCATION: Education details: Rationale for HEP and BID completion, see "today's treatment" for session date Person educated: Patient Education method: Explanation, Demonstration, and Verbal cues Education comprehension: verbalized understanding, returned demonstration, verbal cues required, and needs further education   ASSESSMENT:  CLINICAL IMPRESSION: Patient is a 67 y.o. male who was seen today for treatment of swallowing aafter completion of chemoradiation therapy. Today pt drank 3/4 t sips of thin liquids and ate pureed without overt s/s oral or pharyngeal difficulty. At this time pt swallowing is deemed WNL/WFL with these POs. There are no overt s/s aspiration PNA observed by SLP nor any reported by pt at this time. Data indicate that pt's swallow ability will likely  decrease over the course of radiation/chemoradiation therapy and could very well decline over time following the conclusion of that therapy due to muscle disuse atrophy and/or muscle fibrosis. Pt will cont to need to be seen by SLP in order to assess safety of PO  intake, assess the need for recommending any objective swallow assessment, and ensuring pt is correctly completing the individualized HEP.  OBJECTIVE IMPAIRMENTS: include dysphagia. These impairments are limiting patient from safety when swallowing. Factors affecting potential to achieve goals and functional outcome are  none noted today . Patient will benefit from skilled SLP services to address above impairments and improve overall function.  REHAB POTENTIAL: Good   GOALS: Goals reviewed with patient? Yes  SHORT TERM GOALS: Target: 3rd total session   Pt will compelte HEP with modified independence in 2 sessions Baseline: Goal status: Not met   2.  pt will tell SLP why pt is completing HEP with modified independence Baseline:  Goal status: Met   3.  pt will describe 3 overt s/s aspiration PNA with modified independence Baseline:  Goal status: Met   4.  pt will tell SLP how a food journal could hasten return to a more normalized diet Baseline:  Goal status: Met     LONG TERM GOALS: Target: 7th total session   1.  pt will complete HEP with independence over two visits Baseline:  Goal status: Ongoing   2.  pt will describe how to modify HEP over time, and the timeline associated with reduction in HEP frequency with modified independence over two sessions Baseline:  Goal status: Ongoing  PLAN:  SLP FREQUENCY:  once approx every 4 weeks  SLP DURATION:  6 sessions  PLANNED INTERVENTIONS: Aspiration precaution training, Pharyngeal strengthening exercises, Diet toleration management , Environmental controls, Trials of upgraded texture/liquids, Cueing hierachy, Internal/external aids, SLP instruction and feedback,  Compensatory strategies, and Patient/family education    [facility], [place] 03/11/2023, 8:37 AM

## 2023-03-12 NOTE — Addendum Note (Signed)
Addended by: Garald Balding B on: 03/12/2023 09:30 AM   Modules accepted: Orders

## 2023-03-14 ENCOUNTER — Inpatient Hospital Stay: Payer: Medicare Other

## 2023-03-14 ENCOUNTER — Other Ambulatory Visit: Payer: Self-pay

## 2023-03-14 ENCOUNTER — Inpatient Hospital Stay: Payer: Medicare Other | Admitting: Dietician

## 2023-03-14 ENCOUNTER — Inpatient Hospital Stay: Payer: Medicare Other | Attending: Hematology and Oncology | Admitting: Hematology and Oncology

## 2023-03-14 VITALS — BP 123/75 | HR 76 | Temp 97.9°F | Resp 16 | Ht 69.0 in | Wt 130.2 lb

## 2023-03-14 DIAGNOSIS — K1231 Oral mucositis (ulcerative) due to antineoplastic therapy: Secondary | ICD-10-CM | POA: Diagnosis not present

## 2023-03-14 DIAGNOSIS — Z79891 Long term (current) use of opiate analgesic: Secondary | ICD-10-CM | POA: Diagnosis not present

## 2023-03-14 DIAGNOSIS — G893 Neoplasm related pain (acute) (chronic): Secondary | ICD-10-CM | POA: Insufficient documentation

## 2023-03-14 DIAGNOSIS — Z7952 Long term (current) use of systemic steroids: Secondary | ICD-10-CM | POA: Diagnosis not present

## 2023-03-14 DIAGNOSIS — D72819 Decreased white blood cell count, unspecified: Secondary | ICD-10-CM | POA: Diagnosis not present

## 2023-03-14 DIAGNOSIS — D649 Anemia, unspecified: Secondary | ICD-10-CM | POA: Insufficient documentation

## 2023-03-14 DIAGNOSIS — R634 Abnormal weight loss: Secondary | ICD-10-CM

## 2023-03-14 DIAGNOSIS — T451X5D Adverse effect of antineoplastic and immunosuppressive drugs, subsequent encounter: Secondary | ICD-10-CM | POA: Insufficient documentation

## 2023-03-14 DIAGNOSIS — Z931 Gastrostomy status: Secondary | ICD-10-CM | POA: Insufficient documentation

## 2023-03-14 DIAGNOSIS — C109 Malignant neoplasm of oropharynx, unspecified: Secondary | ICD-10-CM | POA: Diagnosis present

## 2023-03-14 DIAGNOSIS — Z95828 Presence of other vascular implants and grafts: Secondary | ICD-10-CM

## 2023-03-14 DIAGNOSIS — C090 Malignant neoplasm of tonsillar fossa: Secondary | ICD-10-CM | POA: Diagnosis not present

## 2023-03-14 LAB — CMP (CANCER CENTER ONLY)
ALT: 9 U/L (ref 0–44)
AST: 12 U/L — ABNORMAL LOW (ref 15–41)
Albumin: 4.1 g/dL (ref 3.5–5.0)
Alkaline Phosphatase: 47 U/L (ref 38–126)
Anion gap: 4 — ABNORMAL LOW (ref 5–15)
BUN: 15 mg/dL (ref 8–23)
CO2: 33 mmol/L — ABNORMAL HIGH (ref 22–32)
Calcium: 9.8 mg/dL (ref 8.9–10.3)
Chloride: 98 mmol/L (ref 98–111)
Creatinine: 0.79 mg/dL (ref 0.61–1.24)
GFR, Estimated: >60 mL/min (ref >60)
Glucose, Bld: 106 mg/dL — ABNORMAL HIGH (ref 70–99)
Potassium: 4.3 mmol/L (ref 3.5–5.1)
Sodium: 135 mmol/L (ref 135–145)
Total Bilirubin: 0.5 mg/dL (ref 0.3–1.2)
Total Protein: 6.7 g/dL (ref 6.5–8.1)

## 2023-03-14 LAB — CBC WITH DIFFERENTIAL (CANCER CENTER ONLY)
Abs Immature Granulocytes: 0.01 10*3/uL (ref 0.00–0.07)
Basophils Absolute: 0 10*3/uL (ref 0.0–0.1)
Basophils Relative: 1 %
Eosinophils Absolute: 0 10*3/uL (ref 0.0–0.5)
Eosinophils Relative: 0 %
HCT: 32.9 % — ABNORMAL LOW (ref 39.0–52.0)
Hemoglobin: 11.6 g/dL — ABNORMAL LOW (ref 13.0–17.0)
Immature Granulocytes: 0 %
Lymphocytes Relative: 8 %
Lymphs Abs: 0.3 10*3/uL — ABNORMAL LOW (ref 0.7–4.0)
MCH: 36 pg — ABNORMAL HIGH (ref 26.0–34.0)
MCHC: 35.3 g/dL (ref 30.0–36.0)
MCV: 102.2 fL — ABNORMAL HIGH (ref 80.0–100.0)
Monocytes Absolute: 0.7 10*3/uL (ref 0.1–1.0)
Monocytes Relative: 19 %
Neutro Abs: 2.8 10*3/uL (ref 1.7–7.7)
Neutrophils Relative %: 72 %
Platelet Count: 200 10*3/uL (ref 150–400)
RBC: 3.22 MIL/uL — ABNORMAL LOW (ref 4.22–5.81)
RDW: 17 % — ABNORMAL HIGH (ref 11.5–15.5)
WBC Count: 3.8 10*3/uL — ABNORMAL LOW (ref 4.0–10.5)
nRBC: 0 % (ref 0.0–0.2)

## 2023-03-14 MED ORDER — HEPARIN SOD (PORK) LOCK FLUSH 100 UNIT/ML IV SOLN
500.0000 [IU] | Freq: Once | INTRAVENOUS | Status: AC
  Administered 2023-03-14: 500 [IU]

## 2023-03-14 MED ORDER — HYDROCODONE-ACETAMINOPHEN 5-325 MG PO TABS: 1.0000 | ORAL_TABLET | Freq: Four times a day (QID) | ORAL | 0 refills | Status: DC | PRN

## 2023-03-14 MED ORDER — SODIUM CHLORIDE 0.9% FLUSH
10.0000 mL | Freq: Once | INTRAVENOUS | Status: AC
  Administered 2023-03-14: 10 mL

## 2023-03-14 NOTE — Progress Notes (Signed)
[facility] CONSULT NOTE  Patient Care Team: April MansonWhite, Marsha L, NP as PCP - General (Family Medicine) [name], Lise AuerJennifer L, RN as Oncology Nurse Navigator Rachel MouldsIruku, Tinnie Kunin, MD as Consulting Physician (Hematology and Oncology) Lonie PeakSquire, Sarah, MD as Consulting Physician (Radiation Oncology) Scarlette ArHoshal, Steven, MD as Consulting Physician (Otolaryngology)  CHIEF COMPLAINTS/PURPOSE OF CONSULTATION:   SCC oropharynx  ASSESSMENT & PLAN:   #1 This is a very pleasant 67 year old healthy male patient with no significant past medical history newly diagnosed with squamous cell carcinoma both sides, locally advanced with ipsilateral and possibly contralateral lymphadenopathy as well presented to medical oncology for recommendations.  Given the invasion of skeletal muscle, he may be actually a T4 N2 M0 staging so far.    PET on 12/14 no evidence of metastatic disease.Large hypermetabolic right-sided oropharyngeal mass consistent with known squamous cell neoplasm. Associated extensive bilateral multistation hypermetabolic lymphadenopathy. We have discussed that for locally advanced HPV positive oropharyngeal cancers, primary treatment would be to recommend concurrent chemoradiation provided there is no evidence of metastatic disease. I have discussed about the regimen, schedule, adverse effects history significant for chest pain ototoxicity, toxicity, peripheral neuropathy.  He understands that some of the side effects can be life-threatening and permanent.  He has now completed 6 cycles of chemotherapy.  He had complete resolution of cervical lymphadenopathy, tonsillar mass not found on oral exam.  Labs show improvement in leukopenia, anemia and thrombocytopenia.  He will proceed with a PET/CT in May and follow-up with Dr. Basilio CairoSquire.  #2 cancer related pain, patient has been using hydrocodone once a day.  He request 1 more refill for hydrocodone so he can use it before bed.  #3 He has lost some weight  since his last visit here, encouraged to work with nutrition and speech therapy so he can start eating again.  He is currently using the G-tube for feeding  Please do not hesitate to contact us with any additional questions or concerns  HISTORY OF PRESENTING ILLNESS:  Delorise ShinerRick Malphrus 67 y.o. 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  Chronology  Mr. Felipa FurnaceGarcia is a 67 year old male patient no significant medical history who first noticed pain in the right side of his jaw as well as his ER about 3 to 4 months ago.  He used to notice it when he suddenly moves his head towards the right side but did not think much of it.  About for the past month and a half he had excruciating episodes of pain.  He describes this pain as it was not similar job and moves to his ear followed by severe pain in the neck.   The pain is so intense that he has to moan at times.   He got some medication from Dr. Ernestene KielHoshal hydrocodone which she has been taking as needed, he does not really need 1 of this again.  Other than this pain, he denies any new complaints.  He is able to swallow everything.  He had CT imaging on 10/30/2022 which showed 3.5 x 2.3 cm solid mass lesion centered in the right Tonsil with effacement of the edges Glossotonsillar sulcus and likely posteromedial extent along the posterior oropharyngeal wall consistent with provided history of oropharyngeal neoplasm.  There is also a 1.4 cm morphologically suspicious right level 1B lymph node and additional suspected pathologic lymphadenopathy at level 2 with short segment occlusion of right internal jugular vein in the mid neck suboptimally evaluated due to streak artifact from dental amalgam. PET scan with no metastatic  disease.  C1D1 12/10/2022 C2D1 12/17/2022 Port and PEG tube scheduled for 12/13/2022 C3 delayed by a week to 01/01/2023 C5D1 01/15/2023 C6D1 01/22/2023  Interval History  Mr Schill is here for a follow up.  Since last visit here, he continues to struggle with  oral feeding, uses the G tube. Every time he tries to eat something, he says it regurgitates through his nose. He is working with Speech therapy. He is still using the pain medication once a day, will appreciate one more refill for this. Besides the above symptoms, he notices dry mouth. No change in breathing. NO change in bowel habits or urinary habits. Rest of the pertinent 10 point ROS reviewed and negative   Wt Readings from Last 3 Encounters:  03/14/23 130 lb 3.2 oz (59.1 kg)  02/12/23 136 lb (61.7 kg)  01/24/23 135 lb 8 oz (61.5 kg)     MEDICAL HISTORY:  Past Medical History:  Diagnosis Date   Anxiety    Depression    Thyroid disease     SURGICAL HISTORY: Past Surgical History:  Procedure Laterality Date   IR GASTROSTOMY TUBE MOD SED  12/30/2022   IR IMAGING GUIDED PORT INSERTION  12/30/2022   IR NASO G TUBE PLC W/FL W/RAD  12/30/2022    SOCIAL HISTORY: Social History   Socioeconomic History   Marital status: Divorced    Spouse name: Not on file   Number of children: Not on file   Years of education: Not on file   Highest education level: Not on file  Occupational History   Not on file  Tobacco Use   Smoking status: Never   Smokeless tobacco: Never  Substance and Sexual Activity   Alcohol use: Yes   Drug use: Never   Sexual activity: Not on file  Other Topics Concern   Not on file  Social History Narrative   Not on file   Social Determinants of Health   Financial Resource Strain: Medium Risk (12/12/2022)   Overall Financial Resource Strain (CARDIA)    Difficulty of Paying Living Expenses: Somewhat hard  Food Insecurity: No Food Insecurity (12/12/2022)   Hunger Vital Sign    Worried About Running Out of Food in the Last Year: Never true    Ran Out of Food in the Last Year: Never true  Transportation Needs: No Transportation Needs (12/12/2022)   PRAPARE - Administrator, Civil Service (Medical): No    Lack of Transportation (Non-Medical): No   Physical Activity: Not on file  Stress: Not on file  Social Connections: Not on file  Intimate Partner Violence: Not on file    FAMILY HISTORY: No family history on file.  ALLERGIES:  is allergic to sulfa antibiotics.  MEDICATIONS:  Current Outpatient Medications  Medication Sig Dispense Refill   Cholecalciferol (VITAMIN D-3 PO) Take 1 capsule by mouth in the morning.     clonazePAM (KLONOPIN) 1 MG tablet Take 1 mg by mouth 2 (two) times daily as needed for anxiety (sleep).     dexamethasone (DECADRON) 4 MG tablet Take 2 tablets daily x 3 days starting the day after cisplatin chemotherapy. Take with food. (Patient not taking: Reported on 12/13/2022) 30 tablet 1   HYDROcodone-acetaminophen (NORCO/VICODIN) 5-325 MG tablet Take 1 tablet by mouth every 6 (six) hours as needed for moderate pain. 30 tablet 0   levocetirizine (XYZAL) 5 MG tablet Take 5 mg by mouth in the morning.     levothyroxine (SYNTHROID) 175 MCG tablet Take 1  tablet (175 mcg total) by mouth daily before breakfast. 30 tablet 0   lidocaine (XYLOCAINE) 2 % solution Patient: Mix 1part 2% viscous lidocaine, 1part H20. Swish & swallow 10mL of diluted mixture, 30min before meals and at bedtime, up to QID 200 mL 3   lidocaine-prilocaine (EMLA) cream Apply to affected area once (Patient not taking: Reported on 12/13/2022) 30 g 3   Multiple Vitamin (MULTIVITAMIN) tablet Take 1 tablet by mouth in the morning.     Multiple Vitamins-Minerals (ZINC PO) Take 1 tablet by mouth in the morning.     Omega-3 Fatty Acids (FISH OIL PO) Take 1 capsule by mouth in the morning.     ondansetron (ZOFRAN) 8 MG tablet Take 1 tablet (8 mg total) by mouth every 8 (eight) hours as needed for nausea or vomiting. Start on the third day after cisplatin. 30 tablet 1   prochlorperazine (COMPAZINE) 10 MG tablet Take 1 tablet (10 mg total) by mouth every 6 (six) hours as needed (Nausea or vomiting). (Patient not taking: Reported on 12/13/2022) 30 tablet 1   VITAMIN E  PO Take 1 capsule by mouth in the morning.     No current facility-administered medications for this visit.     PHYSICAL EXAMINATION: ECOG PERFORMANCE STATUS: 1 - Symptomatic but completely ambulatory  Vitals:   03/14/23 0954  BP: 123/75  Pulse: 76  Resp: 16  Temp: 97.9 F (36.6 C)  SpO2: 100%     Filed Weights   03/14/23 0954  Weight: 130 lb 3.2 oz (59.1 kg)   GENERAL:alert, no distress and comfortable Neck: NO palpable cervical LN NO tonsillar mass noted in the right tonsil. Abdomen: G tube in place.  No evidence of infection. No BLE edema  LABORATORY DATA:  I have reviewed the data as listed Lab Results  Component Value Date   WBC 3.8 (L) 03/14/2023   HGB 11.6 (L) 03/14/2023   HCT 32.9 (L) 03/14/2023   MCV 102.2 (H) 03/14/2023   PLT 200 03/14/2023     Chemistry      Component Value Date/Time   NA 135 01/21/2023 0822   K 4.4 01/21/2023 0822   CL 97 (L) 01/21/2023 0822   CO2 33 (H) 01/21/2023 0822   BUN 20 01/21/2023 0822   CREATININE 0.69 01/21/2023 0822      Component Value Date/Time   CALCIUM 9.4 01/21/2023 0822   ALKPHOS 57 01/21/2023 0822   AST 19 01/21/2023 0822   ALT 29 01/21/2023 0822   BILITOT 0.5 01/21/2023 [id number]       RADIOGRAPHIC STUDIES: I have personally reviewed the radiological images as listed and agreed with the findings in the report. No results found.  All questions were answered. The patient knows to call the clinic with any problems, questions or concerns. I spent 30 minutes in the care of this patient including H and P, review of records, counseling and coordination of care.     Rachel MouldsPraveena Marene Gilliam, MD 03/14/2023 9:58 AM

## 2023-03-14 NOTE — Progress Notes (Signed)
Nutrition  Patient did not show for nutrition follow-up as scheduled after MD visit. RD was unable to reach patient via telephone. Left VM with request for return call.

## 2023-03-15 ENCOUNTER — Other Ambulatory Visit: Payer: Self-pay

## 2023-04-08 ENCOUNTER — Ambulatory Visit: Payer: Medicare Other

## 2023-04-08 DIAGNOSIS — R1310 Dysphagia, unspecified: Secondary | ICD-10-CM

## 2023-04-08 NOTE — Therapy (Signed)
OUTPATIENT SPEECH LANGUAGE PATHOLOGY ONCOLOGY TREATMENT   Patient Name: Alec Gibson MRN: [id number] DOB:[birth year], 67 y.o., male Today's Date: 04/08/2023  PCP: None in Chart REFERRING PROVIDER: Lonie Peak, MD  END OF SESSION:  End of Session - 04/08/23 0810     Visit Number 5    Number of Visits 7    Date for SLP Re-Evaluation 06/09/23    SLP Start Time 0808    SLP Stop Time  0840    SLP Time Calculation (min) 32 min    Activity Tolerance Patient tolerated treatment well               Past Medical History:  Diagnosis Date   Anxiety    Depression    Thyroid disease    Past Surgical History:  Procedure Laterality Date   IR GASTROSTOMY TUBE MOD SED  12/30/2022   IR IMAGING GUIDED PORT INSERTION  12/30/2022   IR NASO G TUBE PLC W/FL W/RAD  12/30/2022   Patient Active Problem List   Diagnosis Date Noted   Port-A-Cath in place 01/03/2023   Syncope 12/13/2022   Cancer of tonsillar fossa (HCC) 11/22/2022   Encounter for fitting and adjustment of dental prosthetic device 11/19/2022   Teeth missing 11/19/2022   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 And with this dry mouth.Marland KitchenMarland KitchenI'm still not really eating." Pt accompanied by: self  PERTINENT HISTORY:  SCC of the Right Tonsil, stage III (T4N2M0, p 16 +) He presented to his PCP on 10/23/22 with stabbing right ear pain for one month and a swollen lymph node to the right side of his neck present for several months. He also  reported that the ear pain radiated to beneath his jaw. He was urgently referred to ENT. 10/23/22 (same day as PCP visit) He saw Dr. Ernestene Kiel who performed a flexible laryngoscopy which revealed at firm tumor based on the right palatine tonsil with extension through the soft palate into the right eustachian tube. 10/30/22 CT neck was completed revealing  a solid mass lesion centered in the right palatine tonsil measuring 3.5 x 2.3 cm, with effacement of the adjacent parapharyngeal fat, right glossotonsillar sulcus, and likely posteromedial extent along the posterior oropharyngeal wall. CT also showed a morphologically suspicious right level IB lymph node measuring 1.4 cm, and additional suspected level II pathologic lymphadenopathy with short-segment occlusion of the right internal jugular vein in the mid neck. 11/07/22 Biopsy of the right oropharyngeal mass showed high-grade invasive SCC, p 16 +.11/21/22 PET shows a large right oropharyngeal mass and bilateral multistation hypermetabolic lymphadenopathy, no distant metastatic disease. He will receive chemotherapy/radiation. Treatment plan:  He will receive 35 fractions of radiation to his oropharynx and bilateral neck with weekly cisplatin. He started on 12/10/22 and will complete 01/27/23. Pretreatment procedures -PEG/PAC placed 12/13/22  PAIN:  Are you having pain? No, unless swallowing NPRS scale: 8/10, decreases with subsequent swallows of warm water Pain location: back of throat Pain description: burning, soreness Aggravating factors: swallowing Alleviating: medication  PATIENT GOALS: Maintain WNL swallowing  OBJECTIVE:  TODAY'S TREATMENT:                                                                                                                                         DATE:  04/08/23: Pt was provided a moist fig bar and water today and stated he could taste the bar. SLP encouraged him to start a food journal and educated him about this. Pt is  performing the HEP as prescribed for the last 3-4 weeks. Today he had modified independence with HEP. SLP reiterated at LEAST 20 reps of each of the swallowing exercises (1,2,5, and super swallow), with 30 reps/day better than 20 - never performing <5 at once. Pt demo'd understanding.  Cough once with water while chewing after a sip - SLP cautioned pt if he should require moisture when chewing to use only a few drops and not a sip that he swallows while holding solid bolus in his mouth. Pt demo'd understanding.   03/11/23: Pt cont PEG dependent, and cont with hydrocodone regularly. Today he also c/o intense pain when mouth is dry and he attempts to swallow (8/10). Alec Gibson has less pain the more water he drinks, reducing to approx 2-3/10. He reports he then waits until he feels pain and then drinks more. SLP told pt to drink 4-5 3/4 t sips of water every hour. Jameon swishes and expectorates and he says this begins to tear any healing tissue from his oral structures. SLP told pt to go between a gentle swish and tilt of his head instead of  a hard swish - pt demo'd understanding. SLP educated pt that more regular water/moisture will assist healing of oral structures and should decr his overall pain and that other patients have told SLP coconut oil has helped retain moisture given xerostomia. SLP had pt eat applesauce and pt stated he could feel only minimal residue in pharynx which cleared with 3/4 t warm water rinse. SLP encouraged pt to eat pureed x3/day with liquid wash if necessary. No overt s/sx oral or pharyngeal deficits today. Pt has not been completing HEP to recommended frequency due to pain. Because pain was reduced to 2-3/10 SLP reviewed pt's HEP with him and he req'd min A with tongue protrusion on Masako. SLP strongly encouraged pt to complete at LEAST 20 reps of effortful, masako, mendelsohn, and supraglottic, with 6 reps of Shaker per day, more if possible. SLP shared food journal idea with  pt.  02/12/23: Cont to use PEG, minimal water each day. Suboptimal completion of HEP last 3 weeks. Pt req'd cues for [facility] but was independent by session end. SLP reiterated pt could do chin tuck against resistance with fist and shared that pt should do 1-3 reps of each exericse as many times/day as he can, working up to at [employer] 5 reps with a total of at least 20/day for each - by next session.  SLP provided  ideas for dys I foods pt could try and suggested gravies/sauces to aid in pharyngeal transit. Pt coughed with larger sip H2O so SLP cautioned pt to take half sips. When he did this x6 - no coughing occurred. SLP educated pt re: food journal and overt s/sx aspiration PNA.  01/09/23: Pt is using PEG solely, but having some H2O each day. SLP congratulated pt on this. Thickened saliva and mucositis/mouth sores make swallowing and completing HEP more challenging. SLP encouraged pt to cycle through the exercises at this time and do as many cycles as he can, then incr reps as pt is able to do so after chemo/rad tx ends.  Today, pt reported completing HEP at suboptimal frequency and had to look at the HEP to read directions for each exercise prior to completing reps. He req'd occasional min A for procedure. He told SLP rationale for completion with independence. With water sips during  HEP pt reported nasal regurgitation 2/10 sips. SLP encouraged pt to make change to sip size and postural changes with neck to mitigate this. Pt did NOT report this occurring with sips of water throughout the day. SLP postulates that regurgitation occurs due to procedure from HEP.  12/12/22 (eval): Research states the risk for dysphagia increases due to radiation and/or chemotherapy treatment due to a variety of factors, so SLP educated the pt about the possibility of reduced/limited ability for PO intake during rad tx. SLP also educated pt regarding possible changes to swallowing musculature after rad tx, and why adherence to  dysphagia HEP provided today and PO consumption was necessary to inhibit muscle fibrosis following rad tx and to mitigate muscle disuse atrophy. SLP informed pt why this would be detrimental to their swallowing status and to their pulmonary health. Pt demonstrated understanding of these things to SLP. SLP encouraged pt to safely eat and drink as deep into their radiation/chemotherapy as possible to provide the best possible long-term swallowing outcome for pt.  SLP then developed an individualized HEP for pt involving oral and pharyngeal strengthening and ROM and pt was instructed how to perform these exercises, including SLP demonstration. After SLP demonstration, pt return demonstrated each exercise. SLP ensured pt performance was correct prior to educating pt on next exercise. Pt required occasional min-mod cues faded to modified independent to perform HEP. Pt was instructed to complete this program at least 6 days/week, at least 2 times a day until 6 months after his or her last day of rad tx and then x2 a week after that, indefinitely. Among other modifications for days when pt cannot functionally swallow, SLP also suggested pt  cycle through the swallowing portion so the full program of exercises can be completed instead of fatiguing on one of the swallowing exercises and being unable to perform the other swallowing exercises. SLP instructed that swallowing exercise reps should then be increased back into the regimen as pt is able to do so. Secondly, pt was told that former patients have told SLP that during their course of radiation therapy, taking prescribed pain medication just prior to performing HEP (and eating/drinking) has proven helpful in completing HEP (and eating and drinking) more regularly when going through their course of radiation treatment.    PATIENT EDUCATION: Education details: Rationale for HEP and BID completion, see "today's treatment" for session date Person educated:  Patient Education method: Explanation, Demonstration, and Verbal cues Education comprehension: verbalized understanding, returned demonstration, verbal cues required, and needs further education   ASSESSMENT:  CLINICAL IMPRESSION: Patient is  a 67 y.o. male who was seen today for treatment of swallowing aafter completion of chemoradiation therapy. Today pt drank sips of thin liquids and ate dys III solids (fig bar) without overt s/s oral or pharyngeal difficulty. At this time pt swallowing is deemed WNL/WFL with these POs. There are no overt s/s aspiration PNA observed by SLP nor any reported by pt at this time. Data indicate that pt's swallow ability will likely decrease over the course of radiation/chemoradiation therapy and could very well decline over time following the conclusion of that therapy due to muscle disuse atrophy and/or muscle fibrosis. Pt will cont to need to be seen by SLP in order to assess safety of PO intake, assess the need for recommending any objective swallow assessment, and ensuring pt is correctly completing the individualized HEP.  OBJECTIVE IMPAIRMENTS: include dysphagia. These impairments are limiting patient from safety when swallowing. Factors affecting potential to achieve goals and functional outcome are  none noted today . Patient will benefit from skilled SLP services to address above impairments and improve overall function.  REHAB POTENTIAL: Good   GOALS: Goals reviewed with patient? Yes  SHORT TERM GOALS: Target: 3rd total session   Pt will compelte HEP with modified independence in 2 sessions Baseline: Goal status: Not met   2.  pt will tell SLP why pt is completing HEP with modified independence Baseline:  Goal status: Met   3.  pt will describe 3 overt s/s aspiration PNA with modified independence Baseline:  Goal status: Met   4.  pt will tell SLP how a food journal could hasten return to a more normalized diet Baseline:  Goal status: Met      LONG TERM GOALS: Target: 7th total session   1.  pt will complete HEP with independence over two visits Baseline:  Goal status: Ongoing   2.  pt will describe how to modify HEP over time, and the timeline associated with reduction in HEP frequency with modified independence over two sessions Baseline:  Goal status: Ongoing  PLAN:  SLP FREQUENCY:  once approx every 4 weeks  SLP DURATION:  6 sessions  PLANNED INTERVENTIONS: Aspiration precaution training, Pharyngeal strengthening exercises, Diet toleration management , Environmental controls, Trials of upgraded texture/liquids, Cueing hierachy, Internal/external aids, SLP instruction and feedback, Compensatory strategies, and Patient/family education    [facility], CCC-SLP 04/08/2023, 10:15 AM

## 2023-04-10 ENCOUNTER — Telehealth: Payer: Self-pay | Admitting: *Deleted

## 2023-04-10 NOTE — Telephone Encounter (Signed)
CALLED PATIENT TO INFORM OF PET SCAN FOR 05-02-23- ARRIVAL TIME- 7:45 AM @ [facility], PATIENT TO HAVE WATER ONLY- 6 HRS. PRIOR TO TEST, PATIENT TO RECEIVE RESULTS FROM DR. SQUIRE ON 05-06-23 @ 2 PM, LVM FOR A RETURN CALL

## 2023-04-21 NOTE — Progress Notes (Signed)
Mr. Gradney presents today for follow up for treatment of radiation therapy for tonsil cancer. He completed treatment on 01-28-23  Pain issues, if any: Denies any lingering mouth or throat pain Using a feeding tube?: Yes--instills 5 cartons of supplement daily Weight changes, if any: Reports appetite is slowly returning Wt Readings from Last 3 Encounters:  05/02/23 133 lb 12.8 oz (60.7 kg)  03/14/23 130 lb 3.2 oz (59.1 kg)  02/12/23 136 lb (61.7 kg)   Swallowing issues, if any: Denies--reports he's still taking things slow and feels full quickly, but so far everything he's attempted to eat/drink he has tolerated well. Smoking or chewing tobacco? Denies Using fluoride toothpaste daily? Yes Last ENT visit was on: Not since diagnosis Other notable issues, if any: Continues to deal with altered sense of taste, dry mouth, lack of saliva, asnd nasal congestion. Denies any lingering skin concerns in treatment field; skin appears intact and well healed. Reports he hasn't had any fainting/falling spells in over a month, and overall feels well

## 2023-05-01 ENCOUNTER — Encounter: Payer: Self-pay | Admitting: Hematology and Oncology

## 2023-05-02 ENCOUNTER — Ambulatory Visit
Admission: RE | Admit: 2023-05-02 | Discharge: 2023-05-02 | Disposition: A | Discharge status: Home / Self Care | Payer: Medicare Other | Source: Ambulatory Visit | Attending: Radiation Oncology | Admitting: Radiation Oncology

## 2023-05-02 ENCOUNTER — Other Ambulatory Visit: Payer: Self-pay

## 2023-05-02 ENCOUNTER — Encounter (HOSPITAL_COMMUNITY)
Admission: RE | Admit: 2023-05-02 | Discharge: 2023-05-02 | Disposition: A | Discharge status: Home / Self Care | Payer: 59 | Source: Ambulatory Visit | Attending: Radiation Oncology | Admitting: Radiation Oncology

## 2023-05-02 VITALS — BP 116/73 | HR 72 | Temp 97.3°F | Resp 20 | Wt 133.8 lb

## 2023-05-02 DIAGNOSIS — Z79899 Other long term (current) drug therapy: Secondary | ICD-10-CM | POA: Diagnosis not present

## 2023-05-02 DIAGNOSIS — Z923 Personal history of irradiation: Secondary | ICD-10-CM | POA: Diagnosis not present

## 2023-05-02 DIAGNOSIS — R590 Localized enlarged lymph nodes: Secondary | ICD-10-CM | POA: Insufficient documentation

## 2023-05-02 DIAGNOSIS — C090 Malignant neoplasm of tonsillar fossa: Secondary | ICD-10-CM | POA: Insufficient documentation

## 2023-05-02 DIAGNOSIS — C109 Malignant neoplasm of oropharynx, unspecified: Secondary | ICD-10-CM

## 2023-05-02 DIAGNOSIS — B370 Candidal stomatitis: Secondary | ICD-10-CM | POA: Diagnosis not present

## 2023-05-02 DIAGNOSIS — R918 Other nonspecific abnormal finding of lung field: Secondary | ICD-10-CM | POA: Insufficient documentation

## 2023-05-02 DIAGNOSIS — I700 Atherosclerosis of aorta: Secondary | ICD-10-CM | POA: Diagnosis not present

## 2023-05-02 LAB — GLUCOSE, CAPILLARY: Glucose-Capillary: 101 mg/dL — ABNORMAL HIGH (ref 70–99)

## 2023-05-02 MED ORDER — FLUDEOXYGLUCOSE F - 18 (FDG) INJECTION
6.5000 | Freq: Once | INTRAVENOUS | Status: AC | PRN
  Administered 2023-05-02: 6.5 via INTRAVENOUS

## 2023-05-02 MED ORDER — FLUCONAZOLE 100 MG PO TABS
ORAL_TABLET | ORAL | 0 refills | Status: DC
Start: 2023-05-02 — End: 2023-05-27

## 2023-05-02 NOTE — Progress Notes (Signed)
Oncology Nurse Navigator Documentation   I met with Alec Gibson during his follow up with Dr. Basilio Cairo. He received his post treatment PET results. He is aware that Dr. Basilio Cairo has referred him to pulmonology for evaluation of pulmonary nodules seen on his PET scan with possible biopsy. I will get him scheduled with Dr. Al Pimple after I know the date of his biopsy for evaluation and discussion of future treatment. [name] knows to call me if he has any questions or needs at anytime.  Alec Slade RN, BSN, OCN Head & Neck Oncology Nurse Navigator [facility] Phone # [phone]  Fax # [phone]

## 2023-05-06 ENCOUNTER — Telehealth: Payer: Self-pay | Admitting: *Deleted

## 2023-05-06 ENCOUNTER — Ambulatory Visit: Payer: Self-pay | Admitting: Radiation Oncology

## 2023-05-06 NOTE — Telephone Encounter (Signed)
Called patient to inform of appt. with Dr. Levy Pupa on 05-08-23- arrival time- 10:15 am- address - [address], phone number - [phone], lvm for a return call

## 2023-05-06 NOTE — Progress Notes (Addendum)
Radiation Oncology         [phone] ________________________________  Name: Alec Gibson MRN: [id number]  Date: 05/02/2023  DOB: 1955-12-27  Follow-Up Visit Note  CC: [name], NP  Scarlette Ar, MD  Diagnosis and Prior Radiotherapy:       ICD-10-CM   1. 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.0 fluconazole (DIFLUCAN) 100 MG tablet      Cancer Staging  Squamous cell carcinoma of oropharynx (HCC) Staging form: Pharynx - HPV-Mediated Oropharynx, AJCC 8th Edition - Clinical stage from 11/22/2022: Stage III (cT4, cN2, cM0, p16+) - Signed by Lonie Peak, MD on 11/22/2022 Stage prefix: Initial diagnosis    ==========DELIVERED PLANS==========  First Treatment Date: 2022-12-10 - Last Treatment Date: 2023-01-28   Plan Name: HN_Orophar Site: Oropharynx Technique: IMRT Mode: Photon Dose Per Fraction: 2 Gy Prescribed Dose (Delivered / Prescribed): 70 Gy / 70 Gy Prescribed Fxs (Delivered / Prescribed): 35 / 35   CHIEF COMPLAINT:  Here for follow-up and surveillance of tonsil cancer  Narrative:   Alec Gibson presents today for follow up for treatment of radiation therapy for tonsil cancer. He completed treatment on 01-28-23  Pain issues, if any: Denies any lingering mouth or throat pain Using a feeding tube?: Yes--instills 5 cartons of supplement daily Weight changes, if any: Reports appetite is slowly returning Wt Readings from Last 3 Encounters:  05/02/23 133 lb 12.8 oz (60.7 kg)  03/14/23 130 lb 3.2 oz (59.1 kg)  02/12/23 136 lb (61.7 kg)   Swallowing issues, if any: Denies--reports he's still taking things slow and feels full quickly, but so far everything he's attempted to eat/drink he has tolerated well. Smoking or chewing tobacco? Denies Using fluoride toothpaste daily? Yes Last ENT visit was on: Not since diagnosis Other notable issues, if any: Continues to deal with altered sense of taste, dry mouth, lack of saliva, asnd nasal congestion. Denies any lingering skin  concerns in treatment field; skin appears intact and well healed. Reports he hasn't had any fainting/falling spells in over a month, and overall feels well   ALLERGIES:  is allergic to sulfa antibiotics.  Meds: Current Outpatient Medications  Medication Sig Dispense Refill   fluconazole (DIFLUCAN) 100 MG tablet Take 2 tablets today, then 1 tablet daily x 20 more days. 22 tablet 0   Cholecalciferol (VITAMIN D-3 PO) Take 1 capsule by mouth in the morning.     clonazePAM (KLONOPIN) 1 MG tablet Take 1 mg by mouth 2 (two) times daily as needed for anxiety (sleep).     dexamethasone (DECADRON) 4 MG tablet Take 2 tablets daily x 3 days starting the day after cisplatin chemotherapy. Take with food. (Patient not taking: Reported on 12/13/2022) 30 tablet 1   HYDROcodone-acetaminophen (NORCO/VICODIN) 5-325 MG tablet Take 1 tablet by mouth every 6 (six) hours as needed for moderate pain. 30 tablet 0   levocetirizine (XYZAL) 5 MG tablet Take 5 mg by mouth in the morning.     levothyroxine (SYNTHROID) 175 MCG tablet Take 1 tablet (175 mcg total) by mouth daily before breakfast. 30 tablet 0   lidocaine (XYLOCAINE) 2 % solution Patient: Mix 1part 2% viscous lidocaine, 1part H20. Swish & swallow 10mL of diluted mixture, before meals and at bedtime, up to QID 200 mL 3   lidocaine-prilocaine (EMLA) cream Apply to affected area once (Patient not taking: Reported on 12/13/2022) 30 g 3   Multiple Vitamin (MULTIVITAMIN) tablet Take 1 tablet by mouth in the morning.     Multiple  Vitamins-Minerals (ZINC PO) Take 1 tablet by mouth in the morning.     Omega-3 Fatty Acids (FISH OIL PO) Take 1 capsule by mouth in the morning.     ondansetron (ZOFRAN) 8 MG tablet Take 1 tablet (8 mg total) by mouth every 8 (eight) hours as needed for nausea or vomiting. Start on the third day after cisplatin. 30 tablet 1   prochlorperazine (COMPAZINE) 10 MG tablet Take 1 tablet (10 mg total) by mouth every 6 (six) hours as needed (Nausea  or vomiting). (Patient not taking: Reported on 12/13/2022) 30 tablet 1   VITAMIN E PO Take 1 capsule by mouth in the morning.     No current facility-administered medications for this encounter.    Physical Findings: The patient is in no acute distress. Patient is alert and oriented. Wt Readings from Last 3 Encounters:  05/02/23 133 lb 12.8 oz (60.7 kg)  03/14/23 130 lb 3.2 oz (59.1 kg)  02/12/23 136 lb (61.7 kg)    weight is 133 lb 12.8 oz (60.7 kg). His temperature is 97.3 F (36.3 C) (abnormal). His blood pressure is 116/73 and his pulse is 72. His respiration is 20 and oxygen saturation is 100%. .  General: Alert and oriented, in no acute distress HEENT: Head is normocephalic. Extraocular movements are intact. No concerning masses or lesions visualized other than thrush.    Neck: No masses or lesions palpated in the cervical, submandibular, or supraclavicular regions.   Skin: Skin in treatment fields shows satisfactory healing.   Extremities: No cyanosis or edema. Lymphatics: see Neck Exam Psychiatric: Judgment and insight are intact. Affect is appropriate.   Lab Findings: Lab Results  Component Value Date   WBC 3.8 (L) 03/14/2023   HGB 11.6 (L) 03/14/2023   HCT 32.9 (L) 03/14/2023   MCV 102.2 (H) 03/14/2023   PLT 200 03/14/2023    Lab Results  Component Value Date   TSH 33.847 (H) 12/13/2022    Radiographic Findings: NM PET Image Restag (PS) Skull Base To Thigh  Result Date: 05/02/2023 CLINICAL DATA:  Subsequent treatment strategy for tonsillar cancer. EXAM: NUCLEAR MEDICINE PET SKULL BASE TO THIGH TECHNIQUE: 6.5 mCi F-18 FDG was injected intravenously. Full-ring PET imaging was performed from the skull base to thigh after the radiotracer. CT data was obtained and used for attenuation correction and anatomic localization. Fasting blood glucose: 101 mg/dl COMPARISON:  PET-CT dated 11/21/2022 FINDINGS: Mediastinal blood pool activity: SUV max 1.9 Liver activity: SUV max NA  NECK:  No residual hypermetabolic pharyngeal mass. No residual hypermetabolic cervical lymphadenopathy. Incidental CT findings: None. CHEST: 14 mm left lower lobe pulmonary nodule (series 7/image 58),, new, max SUV 6.8. Two additional subcentimeter left upper lobe nodules (series 7/images 18 and 52), new/suspicious, although beneath the size threshold for PET sensitivity. Hypermetabolic cervical lymphadenopathy, including: --Focal hypermetabolism at the right thoracic inlet, max SUV 3.1, equivocal --10 mm short axis low right paratracheal node (series 4/image 84), max SUV 9.8 --Right hilar lymphadenopathy, difficult to measure on unenhanced CT, max SUV 8.6 Right chest port terminates at the cavoatrial junction. Incidental CT findings: Mild atherosclerotic calcifications of the aortic arch. ABDOMEN/PELVIS: Contrast in the in satisfactory position. No abnormal hypermetabolism in the liver, spleen, pancreas, or adrenal glands. No while abdominopelvic lymphadenopathy. Incidental CT findings: Atherosclerotic calcifications of the abdominal aorta and branch vessels. Thick-walled bladder, although underdistended. Mild prostatomegaly. SKELETON: No focal hypermetabolic activity to suggest skeletal metastasis. Incidental CT findings: Mild degenerative changes of the visualized thoracolumbar spine. IMPRESSION: Interval  mixed response. Prior hypermetabolic pharyngeal mass and cervical lymphadenopathy has resolved. New thoracic nodal metastases and left upper lobe pulmonary metastases, as above. No evidence of metastatic disease in the abdomen/pelvis. Electronically Signed   By: Charline Bills M.D.   On: 05/02/2023 10:55    Impression/Plan:   I personally reviewed his images.  PET scan shows complete response in the head and neck region but there are some concerning nodules and lymph nodes in his chest that warrant workup.  I have asked for the pulmonology team to expedite a consultation in the clinic to workup these areas  / arrange bronchoscopy and biopsy  1) Head and Neck Cancer Status as above  2) Nutritional Status: He is still using his feeding tube and also taking nutrition by mouth  Wt Readings from Last 3 Encounters:  05/02/23 133 lb 12.8 oz (60.7 kg)  03/14/23 130 lb 3.2 oz (59.1 kg)  02/12/23 136 lb (61.7 kg)    3) Risk Factors: The patient has been educated about risk factors including alcohol and tobacco abuse; they understand that avoidance of alcohol and tobacco is important to prevent recurrences as well as other cancers  4) Swallowing: Patient continues to engage in SLP exercises  5) Dental: Encouraged to continue regular followup with dentistry, and dental hygiene including fluoride rinses.   6) Thyroid function: This is followed by an outside doctor Lab Results  Component Value Date   TSH 33.847 (H) 12/13/2022  Taking 175 mcg of synthroid  7) He will be discussed at tumor board.  He currently has follow-up with medical oncology in September.  This may need to be moved up if systemic therapy is warranted based on biopsy results.  Follow-up with me will be pending discussion of the above and results from pulmonology.  8) I prescribed fluconazole for thrush.  On date of service, in total, I spent 30 minutes on this encounter. Patient was seen in person.  Note was signed after date of service but minutes pertain to date of service only. _____________________________________     Lonie Peak, MD

## 2023-05-07 ENCOUNTER — Ambulatory Visit: Payer: Medicare Other

## 2023-05-08 ENCOUNTER — Encounter: Payer: Self-pay | Admitting: Emergency Medicine

## 2023-05-08 ENCOUNTER — Ambulatory Visit: Payer: Medicare Other | Admitting: Emergency Medicine

## 2023-05-08 VITALS — BP 112/68 | HR 71 | Temp 97.7°F | Ht 68.0 in | Wt 131.0 lb

## 2023-05-08 DIAGNOSIS — Z01812 Encounter for preprocedural laboratory examination: Secondary | ICD-10-CM | POA: Diagnosis not present

## 2023-05-08 DIAGNOSIS — R9389 Abnormal findings on diagnostic imaging of other specified body structures: Secondary | ICD-10-CM

## 2023-05-08 DIAGNOSIS — R590 Localized enlarged lymph nodes: Secondary | ICD-10-CM

## 2023-05-08 DIAGNOSIS — R918 Other nonspecific abnormal finding of lung field: Secondary | ICD-10-CM | POA: Diagnosis not present

## 2023-05-08 DIAGNOSIS — Z1152 Encounter for screening for COVID-19: Secondary | ICD-10-CM

## 2023-05-08 NOTE — Progress Notes (Signed)
Subjective:    Patient ID: Alec Gibson, male    DOB: 1956-07-25, 67 y.o.   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  He has been treated with chemotherapy and XRT with significant improvement locally.  He had a restaging PET scan done 05/02/2023 that shows hypermetabolic hilar adenopathy, small left upper lobe nodules, and a hypermetabolic 14 mm left lower lobe nodule suspicious for malignancy.  He is here to discuss possible bronchoscopy.  He reports that he is a bit stronger. He is able to swallow soft food. Does not have his taste back. Not limited by his breathing.   PET scan 04/28/2023 reviewed by me shows a new 14 mm left lower lobe pulmonary nodule with SUV max 6.8, 2 additional subcentimeter left upper lobe nodules that are new but beneath the size threshold for PET sensitivity.  There is hypermetabolic cervical adenopathy, a 10 mm low right paratracheal node, right hilar lymphadenopathy with SUV max 8.6   Review of Systems As per HPi  Past Medical History:  Diagnosis Date   Anxiety    Depression    Thyroid disease      No family history on file.   Social History   Socioeconomic History   Marital status: Divorced    Spouse name: Not on file   Number of children: Not on file   Years of education: Not on file   Highest education level: Not on file  Occupational History   Not on file  Tobacco Use   Smoking status: Never   Smokeless tobacco: Never  Substance and Sexual Activity   Alcohol use: Yes   Drug use: Never   Sexual activity: Not on file  Other Topics Concern   Not on file  Social History Narrative   Not on file   Social Determinants of Health   Financial Resource Strain: Medium Risk (12/12/2022)   Overall Financial Resource Strain (CARDIA)    Difficulty of Paying Living Expenses: Somewhat hard  Food Insecurity:  No Food Insecurity (12/12/2022)   Hunger Vital Sign    Worried About Running Out of Food in the Last Year: Never true    Ran Out of Food in the Last Year: Never true  Transportation Needs: No Transportation Needs (12/12/2022)   PRAPARE - Administrator, Civil Service (Medical): No    Lack of Transportation (Non-Medical): No  Physical Activity: Not on file  Stress: Not on file  Social Connections: Not on file  Intimate Partner Violence: Not on file     Allergies  Allergen Reactions   Sulfa Antibiotics Other (See Comments)    Childhood allergy Unknown reaction     Outpatient Medications Prior to Visit  Medication Sig Dispense Refill   Cholecalciferol (VITAMIN D-3 PO) Take 1 capsule by mouth in the morning.     clonazePAM (KLONOPIN) 1 MG tablet Take 1 mg by mouth 2 (two) times daily as needed for anxiety (sleep).     fluconazole (DIFLUCAN) 100 MG tablet Take 2 tablets today, then 1 tablet daily x 20 more days. 22 tablet 0   HYDROcodone-acetaminophen (NORCO/VICODIN) 5-325 MG tablet Take 1 tablet by mouth every 6 (six) hours as needed for moderate pain. 30 tablet 0   levocetirizine (XYZAL) 5 MG tablet Take 5 mg by mouth in the morning.     levothyroxine (SYNTHROID) 175 MCG tablet Take 1 tablet (  175 mcg total) by mouth daily before breakfast. 30 tablet 0   lidocaine (XYLOCAINE) 2 % solution Patient: Mix 1part 2% viscous lidocaine, 1part H20. Swish & swallow 10mL of diluted mixture, before meals and at bedtime, up to QID 200 mL 3   Multiple Vitamin (MULTIVITAMIN) tablet Take 1 tablet by mouth in the morning.     Multiple Vitamins-Minerals (ZINC PO) Take 1 tablet by mouth in the morning.     Omega-3 Fatty Acids (FISH OIL PO) Take 1 capsule by mouth in the morning.     ondansetron (ZOFRAN) 8 MG tablet Take 1 tablet (8 mg total) by mouth every 8 (eight) hours as needed for nausea or vomiting. Start on the third day after cisplatin. 30 tablet 1   VITAMIN E PO Take 1 capsule by  mouth in the morning.     dexamethasone (DECADRON) 4 MG tablet Take 2 tablets daily x 3 days starting the day after cisplatin chemotherapy. Take with food. (Patient not taking: Reported on 12/13/2022) 30 tablet 1   lidocaine-prilocaine (EMLA) cream Apply to affected area once (Patient not taking: Reported on 12/13/2022) 30 g 3   prochlorperazine (COMPAZINE) 10 MG tablet Take 1 tablet (10 mg total) by mouth every 6 (six) hours as needed (Nausea or vomiting). (Patient not taking: Reported on 12/13/2022) 30 tablet 1   No facility-administered medications prior to visit.         Objective:   Physical Exam  Vitals:   05/08/23 1022  BP: 112/68  Pulse: 71  Temp: 97.7 F (36.5 C)  TempSrc: Oral  SpO2: 100%  Weight: 131 lb (59.4 kg)  Height: 5\' 8"  (1.727 m)   Gen: Pleasant, thin gentleman, in no distress,  normal affect  ENT: Tongue very minimally deviated leftward.  Right posterior pharynx smooth but no obvious mass noted.  Very slight dysarthria  Neck: No JVD, no stridor  Lungs: No use of accessory muscles, no crackles or wheezing on normal respiration, no wheeze on forced expiration  Cardiovascular: RRR, heart sounds normal, no murmur or gallops, no peripheral edema  Musculoskeletal: No deformities, no cyanosis or clubbing  Neuro: alert, awake, non focal  Skin: Warm, no lesions or rash     Assessment & Plan:   Abnormal CT of the chest New mediastinal adenopathy and left lower lobe, left upper lobe pulmonary nodules in a gentleman with a history of head neck squamous cell cancer.  Hypermetabolic on PET scan.  Suspect that this is metastatic disease in a patient with a minimal tobacco history.  Have recommended navigational bronchoscopy and EBUS to get a tissue diagnosis.  Dr. Basilio Cairo has requested that we obtain PD-L1, p16 on the samples.  Discussed plans with him and he agrees to proceed.  Understands the risk, benefits, rationale.  We will try to get this set up for  05/27/2023.  Levy Pupa, MD, PhD 05/08/2023, 11:01 AM [facility] [phone] or if no answer before 7:00PM call [phone] For any issues after 7:00PM please call eLink [phone]

## 2023-05-08 NOTE — Patient Instructions (Signed)
We reviewed your PET scan today. We will arrange to perform bronchoscopy with endobronchial ultrasound to evaluate pulmonary nodules and enlarged mediastinal lymph nodes.  This will be done under general anesthesia as an outpatient at [facility] endoscopy.  You will need a designated driver and someone to watch you at home after the procedure is completed.  We will try to get this scheduled for 05/27/2023. We will order a repeat CT scan of the chest to be done before the procedure. Follow Dr. Delton Coombes in 1 month or next available

## 2023-05-08 NOTE — H&P (View-Only) (Signed)
 Subjective:    Patient ID: Alec Gibson, male    DOB: 11/18/1956, 67 y.o.   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  He has been treated with chemotherapy and XRT with significant improvement locally.  He had a restaging PET scan done 05/02/2023 that shows hypermetabolic hilar adenopathy, small left upper lobe nodules, and a hypermetabolic 14 mm left lower lobe nodule suspicious for malignancy.  He is here to discuss possible bronchoscopy.  He reports that he is a bit stronger. He is able to swallow soft food. Does not have his taste back. Not limited by his breathing.   PET scan 04/28/2023 reviewed by me shows a new 14 mm left lower lobe pulmonary nodule with SUV max 6.8, 2 additional subcentimeter left upper lobe nodules that are new but beneath the size threshold for PET sensitivity.  There is hypermetabolic cervical adenopathy, a 10 mm low right paratracheal node, right hilar lymphadenopathy with SUV max 8.6   Review of Systems As per HPi  Past Medical History:  Diagnosis Date   Anxiety    Depression    Thyroid disease      No family history on file.   Social History   Socioeconomic History   Marital status: Divorced    Spouse name: Not on file   Number of children: Not on file   Years of education: Not on file   Highest education level: Not on file  Occupational History   Not on file  Tobacco Use   Smoking status: Never   Smokeless tobacco: Never  Substance and Sexual Activity   Alcohol use: Yes   Drug use: Never   Sexual activity: Not on file  Other Topics Concern   Not on file  Social History Narrative   Not on file   Social Determinants of Health   Financial Resource Strain: Medium Risk (12/12/2022)   Overall Financial Resource Strain (CARDIA)    Difficulty of Paying Living Expenses: Somewhat hard  Food Insecurity:  No Food Insecurity (12/12/2022)   Hunger Vital Sign    Worried About Running Out of Food in the Last Year: Never true    Ran Out of Food in the Last Year: Never true  Transportation Needs: No Transportation Needs (12/12/2022)   PRAPARE - Transportation    Lack of Transportation (Medical): No    Lack of Transportation (Non-Medical): No  Physical Activity: Not on file  Stress: Not on file  Social Connections: Not on file  Intimate Partner Violence: Not on file     Allergies  Allergen Reactions   Sulfa Antibiotics Other (See Comments)    Childhood allergy Unknown reaction     Outpatient Medications Prior to Visit  Medication Sig Dispense Refill   Cholecalciferol (VITAMIN D-3 PO) Take 1 capsule by mouth in the morning.     clonazePAM (KLONOPIN) 1 MG tablet Take 1 mg by mouth 2 (two) times daily as needed for anxiety (sleep).     fluconazole (DIFLUCAN) 100 MG tablet Take 2 tablets today, then 1 tablet daily x 20 more days. 22 tablet 0   HYDROcodone-acetaminophen (NORCO/VICODIN) 5-325 MG tablet Take 1 tablet by mouth every 6 (six) hours as needed for moderate pain. 30 tablet 0   levocetirizine (XYZAL) 5 MG tablet Take 5 mg by mouth in the morning.     levothyroxine (SYNTHROID) 175 MCG tablet Take 1 tablet (  175 mcg total) by mouth daily before breakfast. 30 tablet 0   lidocaine (XYLOCAINE) 2 % solution Patient: Mix 1part 2% viscous lidocaine, 1part H20. Swish & swallow 10mL of diluted mixture, 30min before meals and at bedtime, up to QID 200 mL 3   Multiple Vitamin (MULTIVITAMIN) tablet Take 1 tablet by mouth in the morning.     Multiple Vitamins-Minerals (ZINC PO) Take 1 tablet by mouth in the morning.     Omega-3 Fatty Acids (FISH OIL PO) Take 1 capsule by mouth in the morning.     ondansetron (ZOFRAN) 8 MG tablet Take 1 tablet (8 mg total) by mouth every 8 (eight) hours as needed for nausea or vomiting. Start on the third day after cisplatin. 30 tablet 1   VITAMIN E PO Take 1 capsule by  mouth in the morning.     dexamethasone (DECADRON) 4 MG tablet Take 2 tablets daily x 3 days starting the day after cisplatin chemotherapy. Take with food. (Patient not taking: Reported on 12/13/2022) 30 tablet 1   lidocaine-prilocaine (EMLA) cream Apply to affected area once (Patient not taking: Reported on 12/13/2022) 30 g 3   prochlorperazine (COMPAZINE) 10 MG tablet Take 1 tablet (10 mg total) by mouth every 6 (six) hours as needed (Nausea or vomiting). (Patient not taking: Reported on 12/13/2022) 30 tablet 1   No facility-administered medications prior to visit.         Objective:   Physical Exam  Vitals:   05/08/23 1022  BP: 112/68  Pulse: 71  Temp: 97.7 F (36.5 C)  TempSrc: Oral  SpO2: 100%  Weight: 131 lb (59.4 kg)  Height: 5' 8" (1.727 m)   Gen: Pleasant, thin gentleman, in no distress,  normal affect  ENT: Tongue very minimally deviated leftward.  Right posterior pharynx smooth but no obvious mass noted.  Very slight dysarthria  Neck: No JVD, no stridor  Lungs: No use of accessory muscles, no crackles or wheezing on normal respiration, no wheeze on forced expiration  Cardiovascular: RRR, heart sounds normal, no murmur or gallops, no peripheral edema  Musculoskeletal: No deformities, no cyanosis or clubbing  Neuro: alert, awake, non focal  Skin: Warm, no lesions or rash     Assessment & Plan:   Abnormal CT of the chest New mediastinal adenopathy and left lower lobe, left upper lobe pulmonary nodules in a gentleman with a history of head neck squamous cell cancer.  Hypermetabolic on PET scan.  Suspect that this is metastatic disease in a patient with a minimal tobacco history.  Have recommended navigational bronchoscopy and EBUS to get a tissue diagnosis.  Dr. Squire has requested that we obtain PD-L1, p16 on the samples.  Discussed plans with him and he agrees to proceed.  Understands the risk, benefits, rationale.  We will try to get this set up for  05/27/2023.  Marg Macmaster, MD, PhD 05/08/2023, 11:01 AM [facility] [phone] or if no answer before 7:00PM call [phone] For any issues after 7:00PM please call eLink [phone]  

## 2023-05-08 NOTE — Assessment & Plan Note (Signed)
New mediastinal adenopathy and left lower lobe, left upper lobe pulmonary nodules in a gentleman with a history of head neck squamous cell cancer.  Hypermetabolic on PET scan.  Suspect that this is metastatic disease in a patient with a minimal tobacco history.  Have recommended navigational bronchoscopy and EBUS to get a tissue diagnosis.  Dr. Basilio Cairo has requested that we obtain PD-L1, p16 on the samples.  Discussed plans with him and he agrees to proceed.  Understands the risk, benefits, rationale.  We will try to get this set up for 05/27/2023.

## 2023-05-13 ENCOUNTER — Telehealth: Payer: Self-pay | Admitting: Hematology and Oncology

## 2023-05-13 NOTE — Telephone Encounter (Signed)
Left patient a vm regarding upcoming appointment  

## 2023-05-22 ENCOUNTER — Other Ambulatory Visit: Payer: Self-pay

## 2023-05-23 ENCOUNTER — Ambulatory Visit (HOSPITAL_BASED_OUTPATIENT_CLINIC_OR_DEPARTMENT_OTHER)
Admission: RE | Admit: 2023-05-23 | Discharge: 2023-05-23 | Disposition: A | Discharge status: Home / Self Care | Payer: 59 | Source: Ambulatory Visit | Attending: Emergency Medicine | Admitting: Emergency Medicine

## 2023-05-23 ENCOUNTER — Encounter (HOSPITAL_COMMUNITY): Payer: Self-pay | Admitting: Emergency Medicine

## 2023-05-23 ENCOUNTER — Other Ambulatory Visit: Payer: Medicare Other

## 2023-05-23 DIAGNOSIS — Z1152 Encounter for screening for COVID-19: Secondary | ICD-10-CM

## 2023-05-23 DIAGNOSIS — R590 Localized enlarged lymph nodes: Secondary | ICD-10-CM | POA: Diagnosis present

## 2023-05-23 DIAGNOSIS — R918 Other nonspecific abnormal finding of lung field: Secondary | ICD-10-CM | POA: Insufficient documentation

## 2023-05-26 ENCOUNTER — Other Ambulatory Visit: Payer: Self-pay

## 2023-05-26 ENCOUNTER — Encounter (HOSPITAL_COMMUNITY): Payer: Self-pay | Admitting: Emergency Medicine

## 2023-05-26 NOTE — Progress Notes (Signed)
Spoke with pt for pre-op call. Pt denies cardiac history, HTN or Diabetes. Pt has recently been treated for tonsillar cancer and is now clear from that.   Covid test done 05/23/23.  Shower instructions given to pt.

## 2023-05-27 ENCOUNTER — Ambulatory Visit (HOSPITAL_COMMUNITY)
Admission: RE | Admit: 2023-05-27 | Discharge: 2023-05-27 | Disposition: A | Discharge status: Home / Self Care | Payer: Medicare Other | Attending: Emergency Medicine | Admitting: Emergency Medicine

## 2023-05-27 ENCOUNTER — Encounter (HOSPITAL_COMMUNITY)
Admission: RE | Disposition: A | Discharge status: Home / Self Care | Payer: Self-pay | Source: Home / Self Care | Attending: Emergency Medicine

## 2023-05-27 ENCOUNTER — Ambulatory Visit (HOSPITAL_COMMUNITY): Payer: Medicare Other

## 2023-05-27 ENCOUNTER — Encounter (HOSPITAL_COMMUNITY): Payer: Self-pay | Admitting: Emergency Medicine

## 2023-05-27 ENCOUNTER — Other Ambulatory Visit: Payer: Self-pay

## 2023-05-27 ENCOUNTER — Ambulatory Visit (HOSPITAL_COMMUNITY): Payer: Medicare Other | Admitting: Certified Registered Nurse Anesthetist

## 2023-05-27 ENCOUNTER — Ambulatory Visit (HOSPITAL_BASED_OUTPATIENT_CLINIC_OR_DEPARTMENT_OTHER): Payer: Medicare Other | Admitting: Certified Registered Nurse Anesthetist

## 2023-05-27 DIAGNOSIS — F418 Other specified anxiety disorders: Secondary | ICD-10-CM

## 2023-05-27 DIAGNOSIS — C771 Secondary and unspecified malignant neoplasm of intrathoracic lymph nodes: Secondary | ICD-10-CM | POA: Insufficient documentation

## 2023-05-27 DIAGNOSIS — R9389 Abnormal findings on diagnostic imaging of other specified body structures: Secondary | ICD-10-CM | POA: Diagnosis present

## 2023-05-27 DIAGNOSIS — F419 Anxiety disorder, unspecified: Secondary | ICD-10-CM | POA: Insufficient documentation

## 2023-05-27 DIAGNOSIS — Z79890 Hormone replacement therapy (postmenopausal): Secondary | ICD-10-CM | POA: Diagnosis not present

## 2023-05-27 DIAGNOSIS — C969 Malignant neoplasm of lymphoid, hematopoietic and related tissue, unspecified: Secondary | ICD-10-CM

## 2023-05-27 DIAGNOSIS — C109 Malignant neoplasm of oropharynx, unspecified: Secondary | ICD-10-CM

## 2023-05-27 DIAGNOSIS — C3432 Malignant neoplasm of lower lobe, left bronchus or lung: Secondary | ICD-10-CM | POA: Insufficient documentation

## 2023-05-27 DIAGNOSIS — Z87891 Personal history of nicotine dependence: Secondary | ICD-10-CM

## 2023-05-27 DIAGNOSIS — R590 Localized enlarged lymph nodes: Secondary | ICD-10-CM | POA: Diagnosis not present

## 2023-05-27 DIAGNOSIS — F32A Depression, unspecified: Secondary | ICD-10-CM | POA: Insufficient documentation

## 2023-05-27 DIAGNOSIS — Z79899 Other long term (current) drug therapy: Secondary | ICD-10-CM | POA: Diagnosis not present

## 2023-05-27 DIAGNOSIS — C090 Malignant neoplasm of tonsillar fossa: Secondary | ICD-10-CM

## 2023-05-27 DIAGNOSIS — R918 Other nonspecific abnormal finding of lung field: Secondary | ICD-10-CM | POA: Diagnosis present

## 2023-05-27 HISTORY — PX: BRONCHIAL BRUSHINGS: SHX5108

## 2023-05-27 HISTORY — PX: BRONCHIAL NEEDLE ASPIRATION BIOPSY: SHX5106

## 2023-05-27 HISTORY — DX: Malignant (primary) neoplasm, unspecified: C80.1

## 2023-05-27 HISTORY — PX: BRONCHIAL BIOPSY: SHX5109

## 2023-05-27 HISTORY — PX: VIDEO BRONCHOSCOPY WITH ENDOBRONCHIAL ULTRASOUND: SHX6177

## 2023-05-27 LAB — CBC
HCT: 40.2 % (ref 39.0–52.0)
Hemoglobin: 13.5 g/dL (ref 13.0–17.0)
MCH: 33.6 pg (ref 26.0–34.0)
MCHC: 33.6 g/dL (ref 30.0–36.0)
MCV: 100 fL (ref 80.0–100.0)
Platelets: 172 10*3/uL (ref 150–400)
RBC: 4.02 MIL/uL — ABNORMAL LOW (ref 4.22–5.81)
RDW: 12.1 % (ref 11.5–15.5)
WBC: 4.2 10*3/uL (ref 4.0–10.5)
nRBC: 0 % (ref 0.0–0.2)

## 2023-05-27 LAB — NOVEL CORONAVIRUS, NAA: SARS-CoV-2, NAA: NOT DETECTED

## 2023-05-27 SURGERY — ROBOTIC ASSISTED NAVIGATIONAL BRONCHOSCOPY
Anesthesia: General | Laterality: Bilateral

## 2023-05-27 MED ORDER — ROCURONIUM BROMIDE 10 MG/ML (PF) SYRINGE
PREFILLED_SYRINGE | INTRAVENOUS | Status: DC | PRN
  Administered 2023-05-27: 20 mg via INTRAVENOUS
  Administered 2023-05-27: 50 mg via INTRAVENOUS

## 2023-05-27 MED ORDER — PHENYLEPHRINE HCL-NACL 20-0.9 MG/250ML-% IV SOLN
INTRAVENOUS | Status: DC | PRN
  Administered 2023-05-27: 40 ug/min via INTRAVENOUS

## 2023-05-27 MED ORDER — SUGAMMADEX SODIUM 200 MG/2ML IV SOLN
INTRAVENOUS | Status: DC | PRN
  Administered 2023-05-27: 200 mg via INTRAVENOUS
  Administered 2023-05-27: 100 mg via INTRAVENOUS

## 2023-05-27 MED ORDER — FENTANYL CITRATE (PF) 250 MCG/5ML IJ SOLN
INTRAMUSCULAR | Status: DC | PRN
  Administered 2023-05-27: 100 ug via INTRAVENOUS

## 2023-05-27 MED ORDER — LACTATED RINGERS IV SOLN: INTRAVENOUS | Status: DC

## 2023-05-27 MED ORDER — EPHEDRINE SULFATE-NACL 50-0.9 MG/10ML-% IV SOSY
PREFILLED_SYRINGE | INTRAVENOUS | Status: DC | PRN
  Administered 2023-05-27 (×3): 5 mg via INTRAVENOUS

## 2023-05-27 MED ORDER — PROPOFOL 10 MG/ML IV BOLUS
INTRAVENOUS | Status: DC | PRN
  Administered 2023-05-27: 160 mg via INTRAVENOUS

## 2023-05-27 MED ORDER — MIDAZOLAM HCL 2 MG/2ML IJ SOLN
INTRAMUSCULAR | Status: DC | PRN
  Administered 2023-05-27: 2 mg via INTRAVENOUS

## 2023-05-27 MED ORDER — FLUCONAZOLE 100 MG PO TABS: 100.0000 mg | ORAL_TABLET | Freq: Every morning | ORAL | Status: DC

## 2023-05-27 MED ORDER — DEXAMETHASONE SODIUM PHOSPHATE 10 MG/ML IJ SOLN
INTRAMUSCULAR | Status: DC | PRN
  Administered 2023-05-27: 10 mg via INTRAVENOUS

## 2023-05-27 MED ORDER — ACETAMINOPHEN 10 MG/ML IV SOLN: 1000.0000 mg | Freq: Once | INTRAVENOUS | Status: DC | PRN

## 2023-05-27 MED ORDER — CHLORHEXIDINE GLUCONATE 0.12 % MT SOLN
OROMUCOSAL | Status: AC
  Administered 2023-05-27: 15 mL via OROMUCOSAL
  Filled 2023-05-27: qty 15

## 2023-05-27 MED ORDER — LIDOCAINE 2% (20 MG/ML) 5 ML SYRINGE
INTRAMUSCULAR | Status: DC | PRN
  Administered 2023-05-27: 60 mg via INTRAVENOUS

## 2023-05-27 MED ORDER — ONDANSETRON HCL 4 MG/2ML IJ SOLN
INTRAMUSCULAR | Status: DC | PRN
  Administered 2023-05-27: 4 mg via INTRAVENOUS

## 2023-05-27 MED ORDER — CHLORHEXIDINE GLUCONATE 0.12 % MT SOLN: 15.0000 mL | Freq: Once | OROMUCOSAL | Status: AC

## 2023-05-27 MED ORDER — PHENYLEPHRINE 80 MCG/ML (10ML) SYRINGE FOR IV PUSH (FOR BLOOD PRESSURE SUPPORT)
PREFILLED_SYRINGE | INTRAVENOUS | Status: DC | PRN
  Administered 2023-05-27 (×2): 160 ug via INTRAVENOUS
  Administered 2023-05-27: 80 ug via INTRAVENOUS

## 2023-05-27 MED ORDER — FENTANYL CITRATE (PF) 100 MCG/2ML IJ SOLN: 25.0000 ug | INTRAMUSCULAR | Status: DC | PRN

## 2023-05-27 NOTE — Op Note (Signed)
Video Bronchoscopy with Robotic Assisted Bronchoscopic Navigation and  Endobronchial Ultrasound Procedure Note  Date of Operation: 05/27/2023   Pre-op Diagnosis: Left lower lobe and left upper lobe pulmonary nodules, mediastinal adenopathy  Post-op Diagnosis: Same  Surgeon: Levy Pupa  Assistants: None  Anesthesia: General endotracheal anesthesia  Operation: Flexible video fiberoptic bronchoscopy with robotic assistance and biopsies.  Estimated Blood Loss: Minimal  Complications: None  Indications and History: Alec Gibson is a 67 y.o. 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  He was found to have new suspicious pulmonary nodules on surveillance imaging as well as mediastinal adenopathy.  Left lower lobe nodule, right paratracheal nodes were hypermetabolic on PET scan.  Recommendation made to achieve a tissue diagnosis via robotic assisted navigational bronchoscopy and endobronchial ultrasound with biopsies.  The risks, benefits, complications, treatment options and expected outcomes were discussed with the patient.  The possibilities of pneumothorax, pneumonia, reaction to medication, pulmonary aspiration, perforation of a viscus, bleeding, failure to diagnose a condition and creating a complication requiring transfusion or operation were discussed with the patient who freely signed the consent.    Description of Procedure: The patient was seen in the Preoperative Area, was examined and was deemed appropriate to proceed.  The patient was taken to [facility] endoscopy room 3, identified as Alec Gibson and the procedure verified as Flexible Video Fiberoptic Bronchoscopy.  A Time Out was held and the above information confirmed.   Prior to the date of the procedure a high-resolution CT scan of the chest was performed. Utilizing ION software program a virtual tracheobronchial tree was generated to allow the creation of distinct navigation pathways to the patient's  parenchymal abnormalities. After being taken to the operating room general anesthesia was initiated and the patient  was orally intubated. The video fiberoptic bronchoscope was introduced via the endotracheal tube and a general inspection was performed which showed normal right and left lung anatomy. Aspiration of the bilateral mainstems was completed to remove any remaining secretions. Robotic catheter inserted into patient's endotracheal tube.   Target #1 left lower lobe pulmonary nodule: The distinct navigation pathways prepared prior to this procedure were then utilized to navigate to patient's lesion identified on CT scan. The robotic catheter was secured into place and the vision probe was withdrawn.  Lesion location was approximated using fluoroscopy.  Local registration and targeting was performed using Cios three-dimensional imaging. Under fluoroscopic guidance transbronchial needle brushings, transbronchial needle biopsies, and transbronchial forceps biopsies were performed to be sent for cytology and pathology.   The robotic scope was then withdrawn and the endobronchial ultrasound was used to identify and characterize the peritracheal, hilar and bronchial lymph nodes. Inspection showed enlarged nodes at station 4R and station 11 R. Using real-time ultrasound guidance Wang needle biopsies were take from Station 4R and 11 R nodes and were sent for cytology.   At the end of the procedure a general airway inspection was performed and there was no evidence of active bleeding. The bronchoscope was removed.  The patient tolerated the procedure well. There was no significant blood loss and there were no obvious complications. A post-procedural chest x-ray is pending.  Samples Target #1: 1. Transbronchial needle brushings from left lower lobe pulmonary nodule 2. Transbronchial Wang needle biopsies from left lower lobe pulmonary nodule 3. Transbronchial forceps biopsies from left lower lobe pulmonary  nodule  EBUS samples: 1. Wang needle biopsies from 4R node 2. Wang needle biopsies from 11 R node   Plans:  The patient will be discharged from the PACU to home when recovered from anesthesia and after chest x-ray is reviewed. We will review the cytology, pathology and microbiology results with the patient when they become available. Outpatient followup will be with Dr. Delton Coombes.    Levy Pupa, MD, PhD 05/27/2023, 12:06 PM [facility] [phone] or if no answer before 7:00PM call [phone] For any issues after 7:00PM please call eLink [phone]

## 2023-05-27 NOTE — Transfer of Care (Signed)
Immediate Anesthesia Transfer of Care Note  Patient: Alec Gibson  Procedure(s) Performed: ROBOTIC ASSISTED NAVIGATIONAL BRONCHOSCOPY (Bilateral) VIDEO BRONCHOSCOPY WITH ENDOBRONCHIAL ULTRASOUND (Bilateral) BRONCHIAL NEEDLE ASPIRATION BIOPSIES BRONCHIAL BRUSHINGS BRONCHIAL BIOPSIES  Patient Location: PACU  Anesthesia Type:General  Level of Consciousness: drowsy and patient cooperative  Airway & Oxygen Therapy: Patient Spontanous Breathing  Post-op Assessment: Report given to RN, Post -op Vital signs reviewed and stable, and Patient moving all extremities X 4  Post vital signs: Reviewed and stable  Last Vitals:  Vitals Value Taken Time  BP 139/83 05/27/23 1218  Temp    Pulse 79 05/27/23 1219  Resp 13 05/27/23 1219  SpO2 100 % 05/27/23 1219  Vitals shown include unvalidated device data.  Last Pain:  Vitals:   05/27/23 0912  TempSrc:   PainSc: 0-No pain         Complications: No notable events documented.

## 2023-05-27 NOTE — Discharge Instructions (Signed)
Flexible Bronchoscopy, Care After This sheet gives you information about how to care for yourself after your test. Your doctor may also give you more specific instructions. If you have problems or questions, contact your doctor. Follow these instructions at home: Eating and drinking When your numbness is gone and your cough and gag reflexes have come back, you may: Eat only soft foods. Slowly drink liquids. The day after the test, go back to your normal diet. Driving Do not drive for 24 hours if you were given a medicine to help you relax (sedative). Do not drive or use heavy machinery while taking prescription pain medicine. General instructions  Take over-the-counter and prescription medicines only as told by your doctor. Return to your normal activities as told. Ask what activities are safe for you. Do not use any products that have nicotine or tobacco in them. This includes cigarettes and e-cigarettes. If you need help quitting, ask your doctor. Keep all follow-up visits as told by your doctor. This is important. It is very important if you had a tissue sample (biopsy) taken. Get help right away if: You have shortness of breath that gets worse. You get light-headed. You feel like you are going to pass out (faint). You have chest pain. You cough up: More than a little blood. More blood than before. Summary Do not eat or drink anything (not even water) for 2 hours after your test, or until your numbing medicine wears off. Do not use cigarettes. Do not use e-cigarettes. Get help right away if you have chest pain.  Please call our office for any questions or concerns.  [phone].  This information is not intended to replace advice given to you by your health care provider. Make sure you discuss any questions you have with your health care provider. Document Released: 09/22/2009 Document Revised: 11/07/2017 Document Reviewed: 12/13/2016 Elsevier Patient Education  2020 [employer]  

## 2023-05-27 NOTE — Interval H&P Note (Signed)
History and Physical Interval Note:  05/27/2023 10:38 AM  Alec Gibson  has presented today for surgery, with the diagnosis of LEFT PULMONARY NODULE, MEDIASTINAL ADENOPATHY.  The various methods of treatment have been discussed with the patient and family. After consideration of risks, benefits and other options for treatment, the patient has consented to  Procedure(s): ROBOTIC ASSISTED NAVIGATIONAL BRONCHOSCOPY (Bilateral) VIDEO BRONCHOSCOPY WITH ENDOBRONCHIAL ULTRASOUND (Bilateral) as a surgical intervention.  The patient's history has been reviewed, patient examined, no change in status, stable for surgery.  I have reviewed the patient's chart and labs.  Questions were answered to the patient's satisfaction.     Leslye Peer

## 2023-05-27 NOTE — Anesthesia Procedure Notes (Signed)
Procedure Name: Intubation Date/Time: 05/27/2023 10:50 AM  Performed by: Garfield Cornea, CRNAPre-anesthesia Checklist: Patient identified, Emergency Drugs available, Suction available and Patient being monitored Patient Re-evaluated:Patient Re-evaluated prior to induction Oxygen Delivery Method: Circle System Utilized Preoxygenation: Pre-oxygenation with 100% oxygen Induction Type: IV induction Ventilation: Mask ventilation without difficulty Laryngoscope Size: Mac and 4 Grade View: Grade II Tube type: Oral Tube size: 8.5 mm Number of attempts: 1 Airway Equipment and Method: Stylet Placement Confirmation: ETT inserted through vocal cords under direct vision, positive ETCO2 and breath sounds checked- equal and bilateral Secured at: 22 cm Tube secured with: Tape Dental Injury: Teeth and Oropharynx as per pre-operative assessment  Comments: Grade IIb view with Mac 4.

## 2023-05-27 NOTE — Anesthesia Preprocedure Evaluation (Signed)
Anesthesia Evaluation  Patient identified by MRN, date of birth, ID band Patient awake    Reviewed: Allergy & Precautions, NPO status , Patient's Chart, lab work & pertinent test results  Airway Mallampati: II  TM Distance: >3 FB Neck ROM: Full    Dental no notable dental hx.    Pulmonary former smoker Pulmonary nodule   Pulmonary exam normal        Cardiovascular negative cardio ROS  Rhythm:Regular Rate:Normal     Neuro/Psych   Anxiety Depression    negative neurological ROS     GI/Hepatic negative GI ROS, Neg liver ROS,,,  Endo/Other  negative endocrine ROS    Renal/GU negative Renal ROS  negative genitourinary   Musculoskeletal negative musculoskeletal ROS (+)    Abdominal Normal abdominal exam  (+)   Peds  Hematology negative hematology ROS (+)   Anesthesia Other Findings   Reproductive/Obstetrics                             Anesthesia Physical Anesthesia Plan  ASA: 3  Anesthesia Plan: General   Post-op Pain Management:    Induction: Intravenous  PONV Risk Score and Plan: 2 and Ondansetron, Dexamethasone and Treatment may vary due to age or medical condition  Airway Management Planned: Mask and Oral ETT  Additional Equipment: None  Intra-op Plan:   Post-operative Plan: Extubation in OR  Informed Consent: I have reviewed the patients History and Physical, chart, labs and discussed the procedure including the risks, benefits and alternatives for the proposed anesthesia with the patient or authorized representative who has indicated his/her understanding and acceptance.     Dental advisory given  Plan Discussed with: CRNA  Anesthesia Plan Comments:        Anesthesia Quick Evaluation

## 2023-05-27 NOTE — Anesthesia Postprocedure Evaluation (Signed)
Anesthesia Post Note  Patient: Alec Gibson  Procedure(s) Performed: ROBOTIC ASSISTED NAVIGATIONAL BRONCHOSCOPY (Bilateral) VIDEO BRONCHOSCOPY WITH ENDOBRONCHIAL ULTRASOUND (Bilateral) BRONCHIAL NEEDLE ASPIRATION BIOPSIES BRONCHIAL BRUSHINGS BRONCHIAL BIOPSIES     Patient location during evaluation: PACU Anesthesia Type: General Level of consciousness: awake and alert Pain management: pain level controlled Vital Signs Assessment: post-procedure vital signs reviewed and stable Respiratory status: spontaneous breathing, nonlabored ventilation, respiratory function stable and patient connected to nasal cannula oxygen Cardiovascular status: blood pressure returned to baseline and stable Postop Assessment: no apparent nausea or vomiting Anesthetic complications: no   No notable events documented.  Last Vitals:  Vitals:   05/27/23 1230 05/27/23 1245  BP: 115/84 124/72  Pulse: 77 78  Resp: 16 14  Temp:  36.6 C  SpO2: 97% 97%    Last Pain:  Vitals:   05/27/23 1245  TempSrc:   PainSc: 0-No pain                 Earl Lites P Danielle Mink

## 2023-05-29 ENCOUNTER — Telehealth: Payer: Self-pay | Admitting: Emergency Medicine

## 2023-05-29 LAB — CYTOLOGY - NON PAP

## 2023-05-29 NOTE — Telephone Encounter (Signed)
Called to discuss bronchoscopy results with the patient.  Nodal biopsies showed squamous cell carcinoma.  No answer, left a voicemail.  Will call him back.

## 2023-05-30 ENCOUNTER — Encounter (HOSPITAL_COMMUNITY): Payer: Self-pay | Admitting: Emergency Medicine

## 2023-05-30 NOTE — Telephone Encounter (Signed)
Discussed bronchoscopy results with the patient.  His nodal biopsies confirm squamous cell cancer.  Immunohistochemistry likely still pending.  He understands the results.  He has follow-up with Dr. Al Pimple next week.  Will send this information to Dr. Al Pimple and Dr. Basilio Cairo as Lorain Childes

## 2023-06-02 ENCOUNTER — Other Ambulatory Visit: Payer: Self-pay | Admitting: *Deleted

## 2023-06-03 LAB — CYTOLOGY - NON PAP

## 2023-06-04 ENCOUNTER — Inpatient Hospital Stay (HOSPITAL_BASED_OUTPATIENT_CLINIC_OR_DEPARTMENT_OTHER): Payer: Medicare Other | Admitting: Hematology and Oncology

## 2023-06-04 ENCOUNTER — Other Ambulatory Visit: Payer: Self-pay

## 2023-06-04 ENCOUNTER — Inpatient Hospital Stay: Payer: Medicare Other | Attending: Hematology and Oncology

## 2023-06-04 ENCOUNTER — Encounter: Payer: Self-pay | Admitting: Hematology and Oncology

## 2023-06-04 VITALS — BP 120/77 | HR 76 | Temp 97.9°F | Resp 16 | Wt 126.8 lb

## 2023-06-04 DIAGNOSIS — Z79899 Other long term (current) drug therapy: Secondary | ICD-10-CM | POA: Diagnosis not present

## 2023-06-04 DIAGNOSIS — C109 Malignant neoplasm of oropharynx, unspecified: Secondary | ICD-10-CM

## 2023-06-04 DIAGNOSIS — C7800 Secondary malignant neoplasm of unspecified lung: Secondary | ICD-10-CM | POA: Insufficient documentation

## 2023-06-04 DIAGNOSIS — C771 Secondary and unspecified malignant neoplasm of intrathoracic lymph nodes: Secondary | ICD-10-CM | POA: Diagnosis present

## 2023-06-04 DIAGNOSIS — Z87891 Personal history of nicotine dependence: Secondary | ICD-10-CM | POA: Insufficient documentation

## 2023-06-04 LAB — CBC WITH DIFFERENTIAL (CANCER CENTER ONLY)
Abs Immature Granulocytes: 0 10*3/uL (ref 0.00–0.07)
Basophils Absolute: 0 10*3/uL (ref 0.0–0.1)
Basophils Relative: 1 %
Eosinophils Absolute: 0.1 10*3/uL (ref 0.0–0.5)
Eosinophils Relative: 2 %
HCT: 38.1 % — ABNORMAL LOW (ref 39.0–52.0)
Hemoglobin: 13 g/dL (ref 13.0–17.0)
Immature Granulocytes: 0 %
Lymphocytes Relative: 11 %
Lymphs Abs: 0.5 10*3/uL — ABNORMAL LOW (ref 0.7–4.0)
MCH: 34.5 pg — ABNORMAL HIGH (ref 26.0–34.0)
MCHC: 34.1 g/dL (ref 30.0–36.0)
MCV: 101.1 fL — ABNORMAL HIGH (ref 80.0–100.0)
Monocytes Absolute: 1.1 10*3/uL — ABNORMAL HIGH (ref 0.1–1.0)
Monocytes Relative: 27 %
Neutro Abs: 2.4 10*3/uL (ref 1.7–7.7)
Neutrophils Relative %: 59 %
Platelet Count: 160 10*3/uL (ref 150–400)
RBC: 3.77 MIL/uL — ABNORMAL LOW (ref 4.22–5.81)
RDW: 12 % (ref 11.5–15.5)
WBC Count: 4 10*3/uL (ref 4.0–10.5)
nRBC: 0 % (ref 0.0–0.2)

## 2023-06-04 LAB — MAGNESIUM: Magnesium: 1.9 mg/dL (ref 1.7–2.4)

## 2023-06-04 MED ORDER — PROCHLORPERAZINE MALEATE 10 MG PO TABS: 10.0000 mg | ORAL_TABLET | Freq: Four times a day (QID) | ORAL | 1 refills | Status: DC | PRN

## 2023-06-04 MED ORDER — ONDANSETRON HCL 8 MG PO TABS: 8.0000 mg | ORAL_TABLET | Freq: Three times a day (TID) | ORAL | 1 refills | Status: DC | PRN

## 2023-06-04 MED ORDER — DEXAMETHASONE 4 MG PO TABS
ORAL_TABLET | ORAL | 1 refills | Status: DC
Start: 2023-06-04 — End: 2023-07-07

## 2023-06-04 MED ORDER — LIDOCAINE-PRILOCAINE 2.5-2.5 % EX CREA: TOPICAL_CREAM | CUTANEOUS | 3 refills | Status: DC

## 2023-06-04 NOTE — Progress Notes (Signed)
Alec Gibson  Patient Care Team: [name], NP as PCP - General (Family Medicine) [name], Lise Auer, RN as Oncology Nurse Navigator Rachel Moulds, MD as Consulting Physician (Hematology and Oncology) Lonie Peak, MD as Consulting Physician (Radiation Oncology) Scarlette Ar, MD as Consulting Physician (Otolaryngology)  CHIEF COMPLAINTS/PURPOSE OF CONSULTATION:   SCC oropharynx  ASSESSMENT & PLAN:   #1 This is a very pleasant 67 year old healthy male patient with no significant past medical history newly diagnosed with squamous cell carcinoma both sides, locally advanced with ipsilateral and possibly contralateral lymphadenopathy as well presented to medical oncology for recommendations.  Given the invasion of skeletal muscle, he may be actually a T4 N2 M0 staging so far.    PET on 12/14 no evidence of metastatic disease.Large hypermetabolic right-sided oropharyngeal mass consistent with known squamous cell neoplasm. Associated extensive bilateral multistation hypermetabolic lymphadenopathy. End of treatment PET/CT with interval mixed response, prior hypermetabolic pharyngeal mass and cervical adenopathy has resolved however was found to have new thoracic nodal metastasis and left upper lobe pulmonary metastasis.  Bronchoscopy confirmed P16 positive SCC consistent with metastatic disease. I have discussed that unfortunately this will be considered as a stage IV head and neck cancer and the intent of treatment is palliative.  He was very emotional hearing this and that it is incurable at this point.  He was hoping to move back to [place] to be closer to his family but since he has at home that he needs to sell, he was wondering if he can start the treatment here and then transition to [facility] in [facility].  Although he had chemoradiation within the past 6 months, he had excellent clinical response to chemoradiation despite diagnosis of  metastatic disease.  Hence we have discussed about the first-line treatment which is usually a combination of drugs carboplatin/5-FU/immunotherapy.  Discussed about the mechanism of action of immunotherapy.  We have reviewed the adverse effects from chemo and immunotherapy in detail including but not limited to fatigue, nausea, vomiting, diarrhea, cytopenias, hand-foot syndrome, immunotherapy related side effects such as fatigue, hypo-/hyperthyroidism, skin rash, dryness of the skin and rarely life-threatening and fatal adverse effects from immunotherapy involving any organ. PDL1 and foundation one pending.  He is motivated to try treatment.  He did not tolerate previous chemoradiation very well hence I will plan to start at 80% of the original dose.  Will anticipate repeat imaging in about 2-3 cycles.  HISTORY OF PRESENTING ILLNESS:  Alec Gibson 67 y.o. 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  Chronology  [name] is a 67 year old male patient no significant medical history who first noticed pain in the right side of his jaw as well as his ER about 3 to 4 months ago.  He used to notice it when he suddenly moves his head towards the right side but did not think much of it.  About for the past month and a half he had excruciating episodes of pain.  He describes this pain as it was not similar job and moves to his ear followed by severe pain in the neck.   The pain is so intense that he has to moan at times.   He got some medication from Dr. Ernestene Gibson hydrocodone which she has been taking as needed, he does not really need 1 of this again.  Other than this pain, he denies any new complaints.  He is able to swallow everything.  He had CT imaging on 10/30/2022 which  showed 3.5 x 2.3 cm solid mass lesion centered in the right Tonsil with effacement of the edges Glossotonsillar sulcus and likely posteromedial extent along the posterior oropharyngeal wall consistent with provided history of oropharyngeal  neoplasm.  There is also a 1.4 cm morphologically suspicious right level 1B lymph node and additional suspected pathologic lymphadenopathy at level 2 with short segment occlusion of right internal jugular vein in the mid neck suboptimally evaluated due to streak artifact from dental amalgam. PET scan with no metastatic disease.  C1D1 12/10/2022 C2D1 12/17/2022 Port and PEG tube scheduled for 12/13/2022 C3 delayed by a week to 01/01/2023 C5D1 01/15/2023 C6D1 01/22/2023  Interval History  Mr Alec Gibson is here for a follow up. Since his last visit here, he had a PET/CT which showed concern for metastatic disease, had bronchoscopy and biopsy which confirmed squamous cell carcinoma.  He is here to discuss pathology results and treatment options.  He is recovering well, has not been using his G-tube, overall maintaining his weight except for a couple pound weight loss.  He is understandably very anxious otherwise denying any major complaints today. 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   Depression    Thyroid disease     SURGICAL HISTORY: Past Surgical History:  Procedure Laterality Date   BRONCHIAL BIOPSY  05/27/2023   Procedure: BRONCHIAL BIOPSIES;  Surgeon: Leslye Peer, MD;  Location: [facility] ENDOSCOPY;  Service: Pulmonary;;   BRONCHIAL BRUSHINGS  05/27/2023   Procedure: BRONCHIAL BRUSHINGS;  Surgeon: Leslye Peer, MD;  Location: [facility] ENDOSCOPY;  Service: Pulmonary;;   BRONCHIAL NEEDLE ASPIRATION BIOPSY  05/27/2023   Procedure: BRONCHIAL NEEDLE ASPIRATION BIOPSIES;  Surgeon: Leslye Peer, MD;  Location: [facility] ENDOSCOPY;  Service: Pulmonary;;   IR GASTROSTOMY TUBE MOD SED  12/30/2022   IR IMAGING GUIDED PORT INSERTION  12/30/2022   IR NASO  G TUBE PLC W/FL W/RAD  12/30/2022   VIDEO BRONCHOSCOPY WITH ENDOBRONCHIAL ULTRASOUND Bilateral 05/27/2023   Procedure: VIDEO BRONCHOSCOPY WITH ENDOBRONCHIAL ULTRASOUND;  Surgeon: Leslye Peer, MD;  Location: [facility] ENDOSCOPY;  Service: Pulmonary;  Laterality: Bilateral;    SOCIAL HISTORY: Social History   Socioeconomic History   Marital status: Divorced    Spouse name: Not on file   Number of children: Not on file   Years of education: Not on file   Highest education level: Not on file  Occupational History   Not on file  Tobacco Use   Smoking status: Former    Types: Cigarettes    Passive exposure: Never   Smokeless tobacco: Never  Vaping Use   Vaping Use: Former  Substance and Sexual Activity   Alcohol use: Yes    Comment: occasional   Drug use: Not Currently    Types: Marijuana   Sexual activity: Not on file  Other Topics Concern   Not on file  Social History Narrative   Not on file   Social Determinants of Health   Financial Resource Strain: Medium Risk (12/12/2022)   Overall Financial Resource Strain (CARDIA)    Difficulty of Paying Living Expenses: Somewhat hard  Food Insecurity: No Food Insecurity (12/12/2022)   Hunger Vital Sign  Worried About Programme researcher, broadcasting/film/video in the Last Year: Never true    Ran Out of Food in the Last Year: Never true  Transportation Needs: No Transportation Needs (12/12/2022)   PRAPARE - Administrator, Civil Service (Medical): No    Lack of Transportation (Non-Medical): No  Physical Activity: Not on file  Stress: Not on file  Social Connections: Not on file  Intimate Partner Violence: Not on file    FAMILY HISTORY: No family history on file.  ALLERGIES:  is allergic to sulfa antibiotics.  MEDICATIONS:  Current Outpatient Medications  Medication Sig Dispense Refill   Ascorbic Acid (VITAMIN C PO) Take 1 tablet by mouth in the morning.     Cholecalciferol (VITAMIN D-3 PO) Take 1 capsule by mouth in the morning.      clonazePAM (KLONOPIN) 1 MG tablet Take 1 mg by mouth every evening.     fluconazole (DIFLUCAN) 100 MG tablet Take 1 tablet (100 mg total) by mouth in the morning.     levocetirizine (XYZAL) 5 MG tablet Take 10 mg by mouth every evening.     levothyroxine (SYNTHROID) 200 MCG tablet Take 200 mcg by mouth daily before breakfast.     Multiple Vitamin (MULTIVITAMIN) tablet Take 1 tablet by mouth in the morning.     Multiple Vitamins-Minerals (ZINC PO) Take 1 tablet by mouth in the morning.     Omega-3 Fatty Acids (FISH OIL PO) Take 1 capsule by mouth in the morning.     No current facility-administered medications for this visit.     PHYSICAL EXAMINATION: ECOG PERFORMANCE STATUS: 1 - Symptomatic but completely ambulatory  Vitals:   06/04/23 1152  BP: 120/77  Pulse: 76  Resp: 16  Temp: 97.9 F (36.6 C)  SpO2: 100%     Filed Weights   06/04/23 1152  Weight: 126 lb 12.8 oz (57.5 kg)   GENERAL:alert, no distress and comfortable Rest of the physical exam deferred in lieu of counseling  LABORATORY DATA:  I have reviewed the data as listed Lab Results  Component Value Date   WBC 4.0 06/04/2023   HGB 13.0 06/04/2023   HCT 38.1 (L) 06/04/2023   MCV 101.1 (H) 06/04/2023   PLT 160 06/04/2023     Chemistry      Component Value Date/Time   NA 135 03/14/2023 0935   K 4.3 03/14/2023 0935   CL 98 03/14/2023 0935   CO2 33 (H) 03/14/2023 0935   BUN 15 03/14/2023 0935   CREATININE 0.79 03/14/2023 0935      Component Value Date/Time   CALCIUM 9.8 03/14/2023 0935   ALKPHOS 47 03/14/2023 0935   AST 12 (L) 03/14/2023 0935   ALT 9 03/14/2023 0935   BILITOT 0.5 03/14/2023 0935       RADIOGRAPHIC STUDIES: I have personally reviewed the radiological images as listed and agreed with the findings in the report. CT Super D Chest Wo Contrast  Result Date: 05/27/2023 CLINICAL DATA:  Pre EBM biopsy. New mediastinal/hilar adenopathy and left lower lobe pulmonary lesion on recent PET-CT.  History of head and neck cancer. EXAM: CT CHEST WITHOUT CONTRAST TECHNIQUE: Multidetector CT imaging of the chest was performed using thin slice collimation for electromagnetic bronchoscopy planning purposes, without intravenous contrast. RADIATION DOSE REDUCTION: This exam was performed according to the departmental dose-optimization program which includes automated exposure control, adjustment of the mA and/or kV according to patient size and/or use of iterative reconstruction technique. COMPARISON:  PET-CT 05/02/2023 FINDINGS:  Cardiovascular: The heart is normal in size. No pericardial effusion. The aorta is within normal limits in caliber. Scattered atherosclerotic calcifications. Mediastinum/Nodes: Borderline mediastinal and hilar lymph nodes which were hypermetabolic the recent PET-CT. The esophagus is grossly normal. Lungs/Pleura: 15 mm left lower lobe pulmonary nodule demonstrated hypermetabolism on the PET-CT. 5 mm left upper lobe nodule on image 35/4. 9 mm right upper lobe pulmonary nodule on image 37/4. 4 mm left upper lobe nodule on image number 112/4. Underlying emphysematous changes and areas of pulmonary scarring. The central tracheobronchial tree is unremarkable. Upper Abdomen: No significant upper abdominal findings. Musculoskeletal: No significant bony findings. IMPRESSION: 1. Borderline enlarged mediastinal and hilar lymph nodes which were hypermetabolic on the recent PET-CT. 2. Bilateral pulmonary nodules. The largest measures 15 mm in the left lower lobe. 3. Underlying emphysematous changes and areas of pulmonary scarring. Emphysema (ICD10-J43.9). Electronically Signed   By: Rudie Meyer M.D.   On: 05/27/2023 13:31   DG Chest Port 1 View  Result Date: 05/27/2023 CLINICAL DATA:  Status post bronchoscopy. EXAM: PORTABLE CHEST 1 VIEW COMPARISON:  Chest CT 05/23/2023 FINDINGS: The right IJ power port is in good position without complicating features. The cardiac silhouette, mediastinal and  hilar contours are normal. Stable emphysematous changes. No acute pulmonary findings. No postprocedural pneumothorax or pleural effusion. IMPRESSION: 1. No postprocedural pneumothorax or pleural effusion. 2. Emphysematous changes. Electronically Signed   By: Rudie Meyer M.D.   On: 05/27/2023 13:23   DG C-ARM BRONCHOSCOPY  Result Date: 05/27/2023 C-ARM BRONCHOSCOPY: Fluoroscopy was utilized by the requesting physician.  No radiographic interpretation.    All questions were answered. The patient knows to call the clinic with any problems, questions or concerns. I spent 40 minutes in the care of this patient including H and P, review of records, counseling and coordination of care.     Rachel Moulds, MD 06/04/2023 11:55 AM

## 2023-06-04 NOTE — Progress Notes (Signed)
DISCONTINUE ON PATHWAY REGIMEN - Head and Neck     A cycle is every 7 days:     Cisplatin   **Always confirm dose/schedule in your pharmacy ordering system**  REASON: Disease Progression PRIOR TREATMENT: [id number]: Cisplatin 40 mg/m2 q7 Days with Concurrent Radiation TREATMENT RESPONSE: Progressive Disease (PD)  START ON PATHWAY REGIMEN - Head and Neck     A cycle is every 21 days:     Carboplatin      Fluorouracil      Pembrolizumab   **Always confirm dose/schedule in your pharmacy ordering system**  Patient Characteristics: Oropharynx, HPV Positive, Metastatic, First Line, No Prior Platinum-Based Chemoradiation within 6 Months, PD-L1 Expression Negative (CPS < 1) / Unknown Disease Classification: Oropharynx HPV Status: Positive (+) Therapeutic Status: Metastatic Disease Line of Therapy: First Line Prior Platinum Status: No Prior Platinum-Based Chemoradiation within 6 Months PD-L1 Expression Status: Awaiting Test Results Intent of Therapy: Curative Intent, Discussed with Patient

## 2023-06-05 ENCOUNTER — Telehealth: Payer: Self-pay | Admitting: *Deleted

## 2023-06-05 ENCOUNTER — Other Ambulatory Visit: Payer: Self-pay

## 2023-06-05 NOTE — Telephone Encounter (Signed)
This RN contacted pathology at [facility] for request for Foundation with PDL-1- information given to [place] with appropriate information.

## 2023-06-06 ENCOUNTER — Ambulatory Visit: Payer: Medicare Other | Admitting: Primary Care

## 2023-06-06 ENCOUNTER — Encounter: Payer: Self-pay | Admitting: Primary Care

## 2023-06-06 VITALS — BP 100/58 | HR 58 | Temp 98.2°F | Ht 68.0 in | Wt 127.4 lb

## 2023-06-06 DIAGNOSIS — C109 Malignant neoplasm of oropharynx, unspecified: Secondary | ICD-10-CM

## 2023-06-06 DIAGNOSIS — Z87891 Personal history of nicotine dependence: Secondary | ICD-10-CM | POA: Diagnosis not present

## 2023-06-06 NOTE — Patient Instructions (Addendum)
-   I was so nice meeting you today Alec Gibson, thank you for your talk  - No changes today - Continue to follow with Dr. Iruku/oncology - We will get baseline breathing test called pulmonary function testing- we will contact you to set up a time, you do not need a visit afterward unless you have having breathing issues and want to discuss starting maintenance inhaler  - Call if respiratory symptoms worsen

## 2023-06-06 NOTE — Progress Notes (Signed)
@Patient  ID: Alec Gibson, male    DOB: 1956-08-04, 67 y.o.   MRN: [id number]  Chief Complaint  Patient presents with   Follow-up    No sx noted.  Lung cancer.    Referring provider: [name], NP  HPI: 67 year old male, former smoker.  Has medical history significant for squamous cell carcinoma of oropharynx, abnormal CT chest.  Previous LB pulmonary: 67 year old minimal smoker (1pk-yr) with a history of squamous cell head & neck cancer involving the right tonsillar fossa with bilateral multistation lymphadenopathy.  He has been treated with chemotherapy and XRT with significant improvement locally.  He had a restaging PET scan done 05/02/2023 that shows hypermetabolic hilar adenopathy, small left upper lobe nodules, and a hypermetabolic 14 mm left lower lobe nodule suspicious for malignancy.  He is here to discuss possible bronchoscopy.  He reports that he is a bit stronger. He is able to swallow soft food. Does not have his taste back. Not limited by his breathing.   PET scan 04/28/2023 reviewed by me shows a new 14 mm left lower lobe pulmonary nodule with SUV max 6.8, 2 additional subcentimeter left upper lobe nodules that are new but beneath the size threshold for PET sensitivity.  There is hypermetabolic cervical adenopathy, a 10 mm low right paratracheal node, right hilar lymphadenopathy with SUV max 8.6   06/06/2023 Patient presents today to review bronchoscopy results.  Patient underwent bronchoscopy on 05/27/2023 with Dr. Delton Coombes for left lower lobe and left upper lobe pulmonary nodule along with mediastinal adenopathy. Patient has squamous cell carcinoma.  He has established with Dr. Al Pimple. Patient in the the stages of planning to move back to [place] but will start chemotherapy treatment here.  Breathing wise he is doing fine. No shortness of breath, wheezing or pain. He has a dry cough/throat irritation which is not bothersome to him. No mucus production.  Weight down 30 lbs  since radiation in February. Recently weight has stabilized at 134 but he had a feeding tube. Appetite has improved, weaned off tube feeds. Feeding tube will be removed on July 3rd.    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     Cholecalciferol (VITAMIN D-3 PO) Take 1 capsule by mouth in the morning.     clonazePAM (KLONOPIN) 1 MG tablet Take 1 mg by mouth every evening.     levocetirizine (XYZAL) 5 MG tablet Take 10 mg by mouth every evening.     levothyroxine (SYNTHROID) 200 MCG tablet Take 200 mcg by mouth daily before breakfast.     lidocaine-prilocaine (EMLA)  cream Apply to affected area once 30 g 3   Multiple Vitamin (MULTIVITAMIN) tablet Take 1 tablet by mouth in the morning.     Multiple Vitamins-Minerals (ZINC PO) Take 1 tablet by mouth in the morning.     Omega-3 Fatty Acids (FISH OIL PO) Take 1 capsule by mouth in the morning.     ondansetron (ZOFRAN) 8 MG tablet Take 1 tablet (8 mg total) by mouth every 8 (eight) hours as needed for nausea or vomiting. Start on the third day after carboplatin. 30  tablet 1   prochlorperazine (COMPAZINE) 10 MG tablet Take 1 tablet (10 mg total) by mouth every 6 (six) hours as needed for nausea or vomiting. 30 tablet 1   dexamethasone (DECADRON) 4 MG tablet Take 2 tablets (8mg ) by mouth daily starting the day after carboplatin for 3 days. Take with food 30 tablet 1   fluconazole (DIFLUCAN) 100 MG tablet Take 1 tablet (100 mg total) by mouth in the morning. (Patient not taking: Reported on 06/06/2023)     No facility-administered medications prior to visit.   Review of Systems  Review of Systems  Constitutional: Negative.   Respiratory:  Negative for chest tightness, shortness of breath and wheezing.    Physical Exam  BP (!) 100/58 (BP Location: Right Arm, Patient Position: Sitting, Cuff Size: Normal)   Pulse (!) 58   Temp 98.2 F (36.8 C) (Oral)   Ht 5\' 8"  (1.727 m)   Wt 127 lb 6.4 oz (57.8 kg)   SpO2 98%   BMI 19.37 kg/m  Physical Exam Constitutional:      General: He is not in acute distress.    Appearance: Normal appearance. He is not ill-appearing.  Cardiovascular:     Rate and Rhythm: Normal rate and regular rhythm.  Pulmonary:     Breath sounds: No wheezing, rhonchi or rales.  Musculoskeletal:        General: Normal range of motion.  Skin:    General: Skin is warm and dry.  Neurological:     General: No focal deficit present.     Mental Status: He is alert and oriented to person, place, and time. Mental status is at baseline.  Psychiatric:        Mood and Affect: Mood normal.        Behavior: Behavior normal.        Thought Content: Thought content normal.        Judgment: Judgment normal.      Lab Results:  CBC    Component Value Date/Time   WBC 3.7 (L) 07/07/2023 1016   WBC 4.2 05/27/2023 0931   RBC 3.57 (L) 07/07/2023 1016   HGB 11.9 (L) 07/07/2023 1016   HCT 34.8 (L) 07/07/2023 1016   PLT 131 (L) 07/07/2023 1016   MCV 97.5 07/07/2023 1016   MCH 33.3 07/07/2023 1016   MCHC 34.2 07/07/2023 1016   RDW 12.7  07/07/2023 1016   LYMPHSABS 0.4 (L) 07/07/2023 1016   MONOABS 0.7 07/07/2023 1016   EOSABS 0.1 07/07/2023 1016   BASOSABS 0.0 07/07/2023 1016    BMET    Component Value Date/Time   NA 138 07/07/2023 1016   K 4.4 07/07/2023 1016   CL 103 07/07/2023 1016   CO2 32 07/07/2023 1016   GLUCOSE 112 (H) 07/07/2023 1016   BUN 19 07/07/2023 1016   CREATININE 0.76 07/07/2023 1016   CALCIUM 9.4 07/07/2023 1016   GFRNONAA >60 07/07/2023 1016    BNP  Component Value Date/Time   BNP 30.8 12/13/2022 1044    ProBNP No results found for: "PROBNP"  Imaging: No results found.   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 PET on 12/14 no evidence of metastatic disease. Large hypermetabolic right-sided oropharyngeal mass consistent with known squamous cell neoplasm. Associated extensive bilateral multistation hypermetabolic lymphadenopathy. Patient underwent bronchoscopy on 05/27/2023 with Dr. Delton Coombes for left lower lobe and left upper lobe pulmonary nodule along with mediastinal adenopathy.  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 He is established with Dr. Al Pimple.  Patient is moving back to [place] but will start chemotherapy treatment here.  - No changes today - Continue to follow with Dr. Iruku/oncology - We will get baseline breathing test called pulmonary function testing- we will contact you to set up a time, you do not need a visit afterward unless you have having breathing issues and want to discuss starting maintenance inhaler  - Call if respiratory symptoms worsen   Glenford Bayley, NP 08/09/2023

## 2023-06-09 ENCOUNTER — Ambulatory Visit (HOSPITAL_COMMUNITY)
Admission: RE | Admit: 2023-06-09 | Discharge: 2023-06-09 | Disposition: A | Discharge status: Home / Self Care | Payer: Medicare Other | Source: Ambulatory Visit | Attending: Primary Care | Admitting: Primary Care

## 2023-06-09 DIAGNOSIS — Z87891 Personal history of nicotine dependence: Secondary | ICD-10-CM | POA: Diagnosis present

## 2023-06-09 DIAGNOSIS — J988 Other specified respiratory disorders: Secondary | ICD-10-CM | POA: Insufficient documentation

## 2023-06-09 DIAGNOSIS — R059 Cough, unspecified: Secondary | ICD-10-CM | POA: Insufficient documentation

## 2023-06-09 LAB — PULMONARY FUNCTION TEST
DL/VA % pred: 79 %
DL/VA: 3.34 ml/min/mmHg/L
DLCO unc % pred: 86 %
DLCO unc: 21.01 ml/min/mmHg
FEF 25-75 Pre: 2.38 L/sec
FEF2575-%Pred-Pre: 96 %
FEV1-%Pred-Pre: 93 %
FEV1-Pre: 2.88 L
FEV1FVC-%Pred-Pre: 84 %
FEV6-%Pred-Pre: 115 %
FEV6-Pre: 4.48 L
FEV6FVC-%Pred-Pre: 105 %
FVC-%Pred-Pre: 110 %
FVC-Pre: 4.54 L
Pre FEV1/FVC ratio: 63 %
Pre FEV6/FVC Ratio: 100 %
RV % pred: 89 %
RV: 1.95 L
TLC % pred: 102 %
TLC: 6.56 L

## 2023-06-11 ENCOUNTER — Ambulatory Visit (HOSPITAL_COMMUNITY)
Admission: RE | Admit: 2023-06-11 | Discharge: 2023-06-11 | Disposition: A | Discharge status: Home / Self Care | Payer: Medicare Other | Source: Ambulatory Visit | Attending: Hematology and Oncology | Admitting: Hematology and Oncology

## 2023-06-11 DIAGNOSIS — C109 Malignant neoplasm of oropharynx, unspecified: Secondary | ICD-10-CM | POA: Diagnosis not present

## 2023-06-11 DIAGNOSIS — Z431 Encounter for attention to gastrostomy: Secondary | ICD-10-CM | POA: Insufficient documentation

## 2023-06-11 HISTORY — PX: IR GASTROSTOMY TUBE REMOVAL: IMG5492

## 2023-06-11 NOTE — Procedures (Signed)
Successful bedside removal of balloon retention G-tube. No immediate post procedural complications.  Katherina Right, MD Pager #: [phone]

## 2023-06-18 ENCOUNTER — Other Ambulatory Visit: Payer: Self-pay | Admitting: Hematology and Oncology

## 2023-06-18 DIAGNOSIS — C109 Malignant neoplasm of oropharynx, unspecified: Secondary | ICD-10-CM

## 2023-06-18 MED FILL — Fosaprepitant Dimeglumine For IV Infusion 150 MG (Base Eq): INTRAVENOUS | Qty: 5 | Status: AC

## 2023-06-18 MED FILL — Dexamethasone Sodium Phosphate Inj 100 MG/10ML: INTRAMUSCULAR | Qty: 1 | Status: AC

## 2023-06-18 NOTE — Progress Notes (Signed)
I signed all of the plan, except Ledell Noss, will need labs before calculating carbo dose.  Anacleto Batterman

## 2023-06-19 ENCOUNTER — Other Ambulatory Visit: Payer: Self-pay

## 2023-06-19 ENCOUNTER — Other Ambulatory Visit: Payer: Medicare Other

## 2023-06-19 ENCOUNTER — Other Ambulatory Visit: Payer: Self-pay | Admitting: Hematology and Oncology

## 2023-06-19 ENCOUNTER — Inpatient Hospital Stay: Payer: Medicare Other | Attending: Hematology and Oncology

## 2023-06-19 ENCOUNTER — Inpatient Hospital Stay: Payer: Medicare Other | Admitting: Adult Health

## 2023-06-19 ENCOUNTER — Encounter: Payer: Self-pay | Admitting: Adult Health

## 2023-06-19 ENCOUNTER — Inpatient Hospital Stay: Payer: Medicare Other

## 2023-06-19 ENCOUNTER — Other Ambulatory Visit: Payer: Self-pay | Admitting: *Deleted

## 2023-06-19 VITALS — BP 115/79 | HR 80 | Temp 97.9°F | Resp 18 | Ht 68.0 in | Wt 125.3 lb

## 2023-06-19 DIAGNOSIS — Z452 Encounter for adjustment and management of vascular access device: Secondary | ICD-10-CM | POA: Diagnosis not present

## 2023-06-19 DIAGNOSIS — R5383 Other fatigue: Secondary | ICD-10-CM | POA: Diagnosis not present

## 2023-06-19 DIAGNOSIS — Z87891 Personal history of nicotine dependence: Secondary | ICD-10-CM | POA: Diagnosis not present

## 2023-06-19 DIAGNOSIS — Z95828 Presence of other vascular implants and grafts: Secondary | ICD-10-CM

## 2023-06-19 DIAGNOSIS — Z5111 Encounter for antineoplastic chemotherapy: Secondary | ICD-10-CM | POA: Diagnosis not present

## 2023-06-19 DIAGNOSIS — B379 Candidiasis, unspecified: Secondary | ICD-10-CM | POA: Diagnosis not present

## 2023-06-19 DIAGNOSIS — C7800 Secondary malignant neoplasm of unspecified lung: Secondary | ICD-10-CM | POA: Diagnosis not present

## 2023-06-19 DIAGNOSIS — C771 Secondary and unspecified malignant neoplasm of intrathoracic lymph nodes: Secondary | ICD-10-CM | POA: Diagnosis present

## 2023-06-19 DIAGNOSIS — C090 Malignant neoplasm of tonsillar fossa: Secondary | ICD-10-CM

## 2023-06-19 DIAGNOSIS — B370 Candidal stomatitis: Secondary | ICD-10-CM | POA: Diagnosis not present

## 2023-06-19 DIAGNOSIS — Z79620 Long term (current) use of immunosuppressive biologic: Secondary | ICD-10-CM | POA: Diagnosis not present

## 2023-06-19 DIAGNOSIS — C109 Malignant neoplasm of oropharynx, unspecified: Secondary | ICD-10-CM | POA: Diagnosis present

## 2023-06-19 DIAGNOSIS — Z5112 Encounter for antineoplastic immunotherapy: Secondary | ICD-10-CM | POA: Diagnosis present

## 2023-06-19 LAB — CMP (CANCER CENTER ONLY)
ALT: 13 U/L (ref 0–44)
AST: 17 U/L (ref 15–41)
Albumin: 4 g/dL (ref 3.5–5.0)
Alkaline Phosphatase: 74 U/L (ref 38–126)
Anion gap: 6 (ref 5–15)
BUN: 22 mg/dL (ref 8–23)
CO2: 28 mmol/L (ref 22–32)
Calcium: 10.1 mg/dL (ref 8.9–10.3)
Chloride: 103 mmol/L (ref 98–111)
Creatinine: 0.78 mg/dL (ref 0.61–1.24)
GFR, Estimated: >60 mL/min (ref >60)
Glucose, Bld: 86 mg/dL (ref 70–99)
Potassium: 4.2 mmol/L (ref 3.5–5.1)
Sodium: 137 mmol/L (ref 135–145)
Total Bilirubin: 0.5 mg/dL (ref 0.3–1.2)
Total Protein: 7.3 g/dL (ref 6.5–8.1)

## 2023-06-19 LAB — CBC WITH DIFFERENTIAL (CANCER CENTER ONLY)
Abs Immature Granulocytes: 0 10*3/uL (ref 0.00–0.07)
Basophils Absolute: 0 10*3/uL (ref 0.0–0.1)
Basophils Relative: 0 %
Eosinophils Absolute: 0 10*3/uL (ref 0.0–0.5)
Eosinophils Relative: 1 %
HCT: 38.3 % — ABNORMAL LOW (ref 39.0–52.0)
Hemoglobin: 13.2 g/dL (ref 13.0–17.0)
Immature Granulocytes: 0 %
Lymphocytes Relative: 11 %
Lymphs Abs: 0.5 10*3/uL — ABNORMAL LOW (ref 0.7–4.0)
MCH: 33.8 pg (ref 26.0–34.0)
MCHC: 34.5 g/dL (ref 30.0–36.0)
MCV: 98 fL (ref 80.0–100.0)
Monocytes Absolute: 0.8 10*3/uL (ref 0.1–1.0)
Monocytes Relative: 20 %
Neutro Abs: 2.9 10*3/uL (ref 1.7–7.7)
Neutrophils Relative %: 68 %
Platelet Count: 189 10*3/uL (ref 150–400)
RBC: 3.91 MIL/uL — ABNORMAL LOW (ref 4.22–5.81)
RDW: 11.9 % (ref 11.5–15.5)
WBC Count: 4.2 10*3/uL (ref 4.0–10.5)
nRBC: 0 % (ref 0.0–0.2)

## 2023-06-19 LAB — TSH: TSH: 0.384 u[IU]/mL (ref 0.350–4.500)

## 2023-06-19 MED ORDER — SODIUM CHLORIDE 0.9 % IV SOLN
150.0000 mg | Freq: Once | INTRAVENOUS | Status: AC
  Administered 2023-06-19: 150 mg via INTRAVENOUS
  Filled 2023-06-19: qty 150

## 2023-06-19 MED ORDER — SODIUM CHLORIDE 0.9% FLUSH
10.0000 mL | Freq: Once | INTRAVENOUS | Status: AC
  Administered 2023-06-19: 10 mL

## 2023-06-19 MED ORDER — PALONOSETRON HCL INJECTION 0.25 MG/5ML
0.2500 mg | Freq: Once | INTRAVENOUS | Status: AC
  Administered 2023-06-19: 0.25 mg via INTRAVENOUS
  Filled 2023-06-19: qty 5

## 2023-06-19 MED ORDER — HEPARIN SOD (PORK) LOCK FLUSH 100 UNIT/ML IV SOLN: 500.0000 [IU] | Freq: Once | INTRAVENOUS | Status: DC | PRN

## 2023-06-19 MED ORDER — SODIUM CHLORIDE 0.9 % IV SOLN
200.0000 mg | Freq: Once | INTRAVENOUS | Status: AC
  Administered 2023-06-19: 200 mg via INTRAVENOUS
  Filled 2023-06-19: qty 200

## 2023-06-19 MED ORDER — SODIUM CHLORIDE 0.9% FLUSH
10.0000 mL | INTRAVENOUS | Status: DC | PRN
  Administered 2023-06-19: 10 mL

## 2023-06-19 MED ORDER — FLUCONAZOLE 200 MG PO TABS
ORAL_TABLET | ORAL | 0 refills | Status: DC
Start: 2023-06-19 — End: 2023-07-07

## 2023-06-19 MED ORDER — SODIUM CHLORIDE 0.9 % IV SOLN
800.0000 mg/m2/d | INTRAVENOUS | Status: DC
  Administered 2023-06-19: 5000 mg via INTRAVENOUS
  Filled 2023-06-19: qty 100

## 2023-06-19 MED ORDER — SODIUM CHLORIDE 0.9 % IV SOLN: Freq: Once | INTRAVENOUS | Status: AC

## 2023-06-19 MED ORDER — SODIUM CHLORIDE 0.9 % IV SOLN
336.4000 mg | Freq: Once | INTRAVENOUS | Status: AC
  Administered 2023-06-19: 340 mg via INTRAVENOUS
  Filled 2023-06-19: qty 34

## 2023-06-19 MED ORDER — SODIUM CHLORIDE 0.9 % IV SOLN
10.0000 mg | Freq: Once | INTRAVENOUS | Status: AC
  Administered 2023-06-19: 10 mg via INTRAVENOUS
  Filled 2023-06-19: qty 10

## 2023-06-19 NOTE — Progress Notes (Signed)
Treatment plan signed.

## 2023-06-19 NOTE — Progress Notes (Signed)
[facility] Cancer Follow up:    Alec Manson, NP [address] [place] [address]   DIAGNOSIS:  Cancer Staging  Squamous cell carcinoma of oropharynx (HCC) Staging form: Pharynx - HPV-Mediated Oropharynx, AJCC 8th Edition - Clinical stage from 11/22/2022: Stage III (cT4, cN2, cM0, p16+) - Signed by Lonie Peak, MD on 11/22/2022 Stage prefix: Initial diagnosis   SUMMARY OF ONCOLOGIC HISTORY: Oncology History  Squamous cell carcinoma of oropharynx (HCC)  11/14/2022 Initial Diagnosis   Squamous cell carcinoma of oropharynx (HCC)   11/22/2022 Cancer Staging   Staging form: Pharynx - HPV-Mediated Oropharynx, AJCC 8th Edition - Clinical stage from 11/22/2022: Stage III (cT4, cN2, cM0, p16+) - Signed by Lonie Peak, MD on 11/22/2022 Stage prefix: Initial diagnosis   12/11/2022 - 01/22/2023 Chemotherapy   Patient is on Treatment Plan : HEAD/NECK Cisplatin (40) q7d     12/30/2022 Procedure   G tube and port placement   05/02/2023 PET scan   IMPRESSION: Interval mixed response.   Prior hypermetabolic pharyngeal mass and cervical lymphadenopathy has resolved.   New thoracic nodal metastases and left upper lobe pulmonary metastases, as above.   No evidence of metastatic disease in the abdomen/pelvis.       05/27/2023 Progression   FNA of two LN + Squamous cell carcinoma, Lung LLL FNA and brush biopsy + squamous cell carcinoma   06/19/2023 -  Chemotherapy   Patient is on Treatment Plan : HEAD/NECK Pembrolizumab (200) D1 + Carboplatin (5) D1 + 5FU (1000) IVCI D1-4 q21d x 6 cycles / Pembrolizumab (200) D1 q21d       CURRENT THERAPY: Pembrolizumab, Carboplatin, 5FU  INTERVAL HISTORY: Alec Gibson 67 y.o. male returns for follow-up of his stage IV squamous cell carcinoma.  He is due to begin treatment with pembrolizumab, carboplatin, and 5-FU.  He is planning on moving back to [place] and is tearful when mentioning his grandchildren that he wants to  spend time with and get to know.  He plans on doing this after receiving his second cycle of treatment and has spent his time packing his home so he can prepare to move.  He denies any questions and other than continuing to adjust the fact that he has stage IV cancer he is doing relatively well.   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  MEDICAL HISTORY: Past Medical History:  Diagnosis Date   Anxiety    Cancer [facility])    Tonsillar cancer -2024   Depression    Thyroid disease     SURGICAL HISTORY: Past Surgical History:  Procedure Laterality Date   BRONCHIAL BIOPSY  05/27/2023   Procedure: BRONCHIAL BIOPSIES;  Surgeon: Leslye Peer, MD;  Location: [facility] ENDOSCOPY;  Service: Pulmonary;;   BRONCHIAL BRUSHINGS  05/27/2023   Procedure: BRONCHIAL BRUSHINGS;  Surgeon: Leslye Peer, MD;  Location: [facility] ENDOSCOPY;  Service: Pulmonary;;   BRONCHIAL NEEDLE ASPIRATION BIOPSY  05/27/2023  Procedure: BRONCHIAL NEEDLE ASPIRATION BIOPSIES;  Surgeon: Leslye Peer, MD;  Location: [facility] ENDOSCOPY;  Service: Pulmonary;;   IR GASTROSTOMY TUBE MOD SED  12/30/2022   IR GASTROSTOMY TUBE REMOVAL  06/11/2023   IR IMAGING GUIDED PORT INSERTION  12/30/2022   IR NASO G TUBE PLC W/FL W/RAD  12/30/2022   VIDEO BRONCHOSCOPY WITH ENDOBRONCHIAL ULTRASOUND Bilateral 05/27/2023   Procedure: VIDEO BRONCHOSCOPY WITH ENDOBRONCHIAL ULTRASOUND;   Surgeon: Leslye Peer, MD;  Location: [facility] ENDOSCOPY;  Service: Pulmonary;  Laterality: Bilateral;    SOCIAL HISTORY: Social History   Socioeconomic History   Marital status: Divorced    Spouse name: Not on file   Number of children: Not on file   Years of education: Not on file   Highest education level: Not on file  Occupational History   Not on file  Tobacco Use   Smoking status: Former    Current packs/day: 0.00    Types: Cigarettes    Start date: 50    Quit date: 39    Years since quitting: 39.5    Passive exposure: Never   Smokeless tobacco: Never  Vaping Use   Vaping status: Former  Substance and Sexual Activity   Alcohol use: Yes    Comment: occasional   Drug use: Not Currently    Types: Marijuana   Sexual activity: Not on file  Other Topics Concern   Not on file  Social History Narrative   Not on file   Social Determinants of Health   Financial Resource Strain: Low Risk  (04/29/2023)   Received from [facility]   Overall Financial Resource Strain (CARDIA)    Difficulty of Paying Living Expenses: Not very hard  Food Insecurity: No Food Insecurity (04/29/2023)   Received from [facility]   Hunger Vital Sign    Worried About Running Out of Food in the Last Year: Never true    Ran Out of Food in the Last Year: Never true  Transportation Needs: No Transportation Needs (04/29/2023)   Received from [facility] - Transportation    Lack of Transportation (Medical): No    Lack of Transportation (Non-Medical): No  Physical Activity: Sufficiently Active (04/29/2023)   Received from [facility]   Exercise Vital Sign    Days of Exercise per Week: 4 days    Minutes of Exercise per Session: 60 min  Stress: Stress Concern Present (04/29/2023)   Received from [facility] - Occupational Stress Questionnaire    Feeling of Stress : To some extent  Social Connections: Socially Integrated (04/29/2023)    Received from [facility]   Social Network    How would you rate your social network (family, work, friends)?: Good participation with social networks  Intimate Partner Violence: Not At Risk (04/29/2023)   Received from [facility]   HITS    Over the last 12 months how often did your partner physically hurt you?: 1    Over the last 12 months how often did your partner insult you or talk down to you?: 1    Over the last 12 months how often did your partner threaten you with physical harm?: 1    Over the last 12 months how often did your partner scream or curse at you?: 1    FAMILY HISTORY: History reviewed. No pertinent family history.  Review of Systems  Constitutional:  Positive for fatigue. Negative for appetite change, chills, fever and unexpected weight  change.  HENT:   Negative for hearing loss, lump/mass and trouble swallowing.   Eyes:  Negative for eye problems and icterus.  Respiratory:  Negative for chest tightness, cough and shortness of breath.   Cardiovascular:  Negative for chest pain, leg swelling and palpitations.  Gastrointestinal:  Negative for abdominal distention, abdominal pain, constipation, diarrhea, nausea and vomiting.  Endocrine: Negative for hot flashes.  Genitourinary:  Negative for difficulty urinating.   Musculoskeletal:  Negative for arthralgias.  Skin:  Negative for itching and rash.  Neurological:  Negative for dizziness, extremity weakness, headaches and numbness.  Hematological:  Negative for adenopathy. Does not bruise/bleed easily.  Psychiatric/Behavioral:  Negative for depression. The patient is not nervous/anxious.       PHYSICAL EXAMINATION   Onc Performance Status - 06/19/23 1146       ECOG Perf Status   ECOG Perf Status Fully active, able to carry on all pre-disease performance without restriction      KPS SCALE   KPS % SCORE Normal, no compliants, no evidence of disease             Vitals:   06/19/23 1146  BP: 115/79   Pulse: 80  Resp: 18  Temp: 97.9 F (36.6 C)  SpO2: 100%    Physical Exam Constitutional:      General: He is not in acute distress.    Appearance: Normal appearance. He is not toxic-appearing.  HENT:     Head: Normocephalic and atraumatic.     Mouth/Throat:     Mouth: Mucous membranes are moist.     Pharynx: Oropharyngeal exudate (Thrush noted in oropharynx and on tongue.) present. No posterior oropharyngeal erythema.  Eyes:     General: No scleral icterus. Cardiovascular:     Rate and Rhythm: Normal rate and regular rhythm.     Pulses: Normal pulses.     Heart sounds: Normal heart sounds.  Pulmonary:     Effort: Pulmonary effort is normal.     Breath sounds: Normal breath sounds.  Abdominal:     General: Abdomen is flat. Bowel sounds are normal. There is no distension.     Palpations: Abdomen is soft.     Tenderness: There is no abdominal tenderness.  Musculoskeletal:        General: No swelling.     Cervical back: Neck supple.  Lymphadenopathy:     Cervical: No cervical adenopathy.  Skin:    General: Skin is warm and dry.     Findings: No rash.  Neurological:     General: No focal deficit present.     Mental Status: He is alert.  Psychiatric:        Mood and Affect: Mood normal.        Behavior: Behavior normal.     LABORATORY DATA:  CBC    Component Value Date/Time   WBC 4.2 06/19/2023 1122   WBC 4.2 05/27/2023 0931   RBC 3.91 (L) 06/19/2023 1122   HGB 13.2 06/19/2023 1122   HCT 38.3 (L) 06/19/2023 1122   PLT 189 06/19/2023 1122   MCV 98.0 06/19/2023 1122   MCH 33.8 06/19/2023 1122   MCHC 34.5 06/19/2023 1122   RDW 11.9 06/19/2023 1122   LYMPHSABS 0.5 (L) 06/19/2023 1122   MONOABS 0.8 06/19/2023 1122   EOSABS 0.0 06/19/2023 1122   BASOSABS 0.0 06/19/2023 1122    CMP     Component Value Date/Time   NA 137 06/19/2023 1246   K 4.2 06/19/2023 1246  CL 103 06/19/2023 1246   CO2 28 06/19/2023 1246   GLUCOSE 86 06/19/2023 1246   BUN 22  06/19/2023 1246   CREATININE 0.78 06/19/2023 1246   CALCIUM 10.1 06/19/2023 1246   PROT 7.3 06/19/2023 1246   ALBUMIN 4.0 06/19/2023 1246   AST 17 06/19/2023 1246   ALT 13 06/19/2023 1246   ALKPHOS 74 06/19/2023 1246   BILITOT 0.5 06/19/2023 1246   GFRNONAA >60 06/19/2023 1246       ASSESSMENT and THERAPY PLAN:   Squamous cell carcinoma of oropharynx (HCC) Alec Gibson is a 67 year old man here today for labs, follow-up, and treatment with pembrolizumab, carboplatin, and 5-FU.  Stage IV squamous cell carcinoma: He will proceed with treatment today so long as his labs are stable.  His c-Met was not drawn prior to his visit with me so we will have to wait on those results to dose his carboplatin.  He understands risks and benefits of his treatment and is in agreement to proceed. Stage IV cancer: He continues to slowly adjust to this unfortunate reality.  We assisted him with getting a records release signed and we will fax his records and a referral to [facility] in [place].  My nurse Tammy helped me with this today. Thrush: I prescribed Fluconazole 200mg  po every other day x 3 to his pharmacy.    Cara will return for pump DC on July 15.  And we will see him back in 3 weeks for labs, follow-up, and his next treatment.   All questions were answered. The patient knows to call the clinic with any problems, questions or concerns. We can certainly see the patient much sooner if necessary.  Total encounter time:30 minutes*in face-to-face visit time, chart review, lab review, care coordination, order entry, and documentation of the encounter time.    Lillard Anes, NP 06/19/23 2:15 PM Medical Oncology and Hematology [facility] [address] [place], [place] [address] Tel. [phone]    Fax. [phone]  *Total Encounter Time as defined by the Centers for Medicare and Medicaid Services includes, in addition to the face-to-face time of a patient visit (documented in the  note above) non-face-to-face time: obtaining and reviewing outside history, ordering and reviewing medications, tests or procedures, care coordination (communications with other health care professionals or caregivers) and documentation in the medical record.

## 2023-06-19 NOTE — Patient Instructions (Signed)
[facility]  Discharge Instructions: Thank you for choosing [facility] to provide your oncology and hematology care.   If you have a lab appointment with the Cancer Center, please go directly to the Cancer Center and check in at the registration area.   Wear comfortable clothing and clothing appropriate for easy access to any Portacath or PICC line.   We strive to give you quality time with your provider. You may need to reschedule your appointment if you arrive late (15 or more minutes).  Arriving late affects you and other patients whose appointments are after yours.  Also, if you miss three or more appointments without notifying the office, you may be dismissed from the clinic at the provider's discretion.      For prescription refill requests, have your pharmacy contact our office and allow 72 hours for refills to be completed.    Today you received the following chemotherapy and/or immunotherapy agents: Keytruda, Carboplatin, Adrucil   To help prevent nausea and vomiting after your treatment, we encourage you to take your nausea medication as directed.  BELOW ARE SYMPTOMS THAT SHOULD BE REPORTED IMMEDIATELY: *FEVER GREATER THAN 100.4 F (38 C) OR HIGHER *CHILLS OR SWEATING *NAUSEA AND VOMITING THAT IS NOT CONTROLLED WITH YOUR NAUSEA MEDICATION *UNUSUAL SHORTNESS OF BREATH *UNUSUAL BRUISING OR BLEEDING *URINARY PROBLEMS (pain or burning when urinating, or frequent urination) *BOWEL PROBLEMS (unusual diarrhea, constipation, pain near the anus) TENDERNESS IN MOUTH AND THROAT WITH OR WITHOUT PRESENCE OF ULCERS (sore throat, sores in mouth, or a toothache) UNUSUAL RASH, SWELLING OR PAIN  UNUSUAL VAGINAL DISCHARGE OR ITCHING   Items with * indicate a potential emergency and should be followed up as soon as possible or go to the Emergency Department if any problems should occur.  Please show the CHEMOTHERAPY ALERT CARD or IMMUNOTHERAPY  ALERT CARD at check-in to the Emergency Department and triage nurse.  Should you have questions after your visit or need to cancel or reschedule your appointment, please contact [facility]  Dept: [phone]  and follow the prompts.  Office hours are 8:00 a.m. to 4:30 p.m. Monday - Friday. Please note that voicemails left after 4:00 p.m. may not be returned until the following business day.  We are closed weekends and major holidays. You have access to a nurse at all times for urgent questions. Please call the main number to the clinic Dept: [phone] and follow the prompts.   For any non-urgent questions, you may also contact your provider using MyChart. We now offer e-Visits for anyone 59 and older to request care online for non-urgent symptoms. For details visit [email or web address].   Also download the MyChart app! Go to the app store, search "MyChart", open the app, select [place], and log in with your MyChart username and password.

## 2023-06-19 NOTE — Assessment & Plan Note (Addendum)
Taseen is a 67 year old man here today for labs, follow-up, and treatment with pembrolizumab, carboplatin, and 5-FU.  Stage IV squamous cell carcinoma: He will proceed with treatment today so long as his labs are stable.  His c-Met was not drawn prior to his visit with me so we will have to wait on those results to dose his carboplatin.  He understands risks and benefits of his treatment and is in agreement to proceed. Stage IV cancer: He continues to slowly adjust to this unfortunate reality.  We assisted him with getting a records release signed and we will fax his records and a referral to [facility] in [place].  My nurse Tammy helped me with this today. Thrush: I prescribed Fluconazole 200mg  po every other day x 3 to his pharmacy.    Eduar will return for pump DC on July 15.  And we will see him back in 3 weeks for labs, follow-up, and his next treatment.

## 2023-06-20 ENCOUNTER — Telehealth: Payer: Self-pay

## 2023-06-20 LAB — T4: T4, Total: 8.4 ug/dL (ref 4.5–12.0)

## 2023-06-20 NOTE — Telephone Encounter (Signed)
-----   Message from Nurse Billey Chang sent at 06/19/2023  4:35 PM EDT ----- Regarding: 1st time Keytruda, Carboplatin, and adrucil. Dr. Al Pimple patient Hello, Alec Gibson received his 1st round of Keytruda/carbo/adrucil today. Please check on him tomorrow. Thank you

## 2023-06-20 NOTE — Telephone Encounter (Signed)
Alec Gibson states that he is doing fine. He is eating, drinking, and urinating well. He knows to call the office at [phone] if he has any questions or concerns.

## 2023-06-23 ENCOUNTER — Inpatient Hospital Stay: Payer: 59

## 2023-06-23 ENCOUNTER — Encounter (HOSPITAL_COMMUNITY): Payer: Self-pay

## 2023-06-23 VITALS — BP 133/78 | HR 93 | Temp 99.2°F | Resp 18

## 2023-06-23 DIAGNOSIS — Z5112 Encounter for antineoplastic immunotherapy: Secondary | ICD-10-CM | POA: Diagnosis not present

## 2023-06-23 DIAGNOSIS — C109 Malignant neoplasm of oropharynx, unspecified: Secondary | ICD-10-CM

## 2023-06-23 MED ORDER — HEPARIN SOD (PORK) LOCK FLUSH 100 UNIT/ML IV SOLN
500.0000 [IU] | Freq: Once | INTRAVENOUS | Status: AC | PRN
  Administered 2023-06-23: 500 [IU]

## 2023-06-23 MED ORDER — SODIUM CHLORIDE 0.9% FLUSH
10.0000 mL | INTRAVENOUS | Status: DC | PRN
  Administered 2023-06-23: 10 mL

## 2023-06-25 ENCOUNTER — Encounter: Payer: Self-pay | Admitting: Hematology and Oncology

## 2023-06-26 ENCOUNTER — Telehealth: Payer: Self-pay | Admitting: Adult Health

## 2023-06-26 NOTE — Telephone Encounter (Signed)
Scheduled appointments per WQ. Patient is aware of the made appointments.  

## 2023-06-30 ENCOUNTER — Other Ambulatory Visit (HOSPITAL_COMMUNITY): Payer: Medicare Other

## 2023-07-03 ENCOUNTER — Encounter: Payer: Self-pay | Admitting: Hematology and Oncology

## 2023-07-04 MED FILL — Fosaprepitant Dimeglumine For IV Infusion 150 MG (Base Eq): INTRAVENOUS | Qty: 5 | Status: AC

## 2023-07-04 MED FILL — Dexamethasone Sodium Phosphate Inj 100 MG/10ML: INTRAMUSCULAR | Qty: 1 | Status: AC

## 2023-07-06 NOTE — Progress Notes (Unsigned)
Patient Care Team: [name], NP as PCP - General (Family Medicine) [name], Lise Auer, RN as Oncology Nurse Navigator Rachel Moulds, MD as Consulting Physician (Hematology and Oncology) Lonie Peak, MD as Consulting Physician (Radiation Oncology) Scarlette Ar, MD as Consulting Physician (Otolaryngology)   CHIEF COMPLAINT: Follow up carcinoma of the oropharynx  Oncology History  Squamous cell carcinoma of oropharynx (HCC)  11/14/2022 Initial Diagnosis   Squamous cell carcinoma of oropharynx (HCC)   11/22/2022 Cancer Staging   Staging form: Pharynx - HPV-Mediated Oropharynx, AJCC 8th Edition - Clinical stage from 11/22/2022: Stage III (cT4, cN2, cM0, p16+) - Signed by Lonie Peak, MD on 11/22/2022 Stage prefix: Initial diagnosis   12/11/2022 - 01/22/2023 Chemotherapy   Patient is on Treatment Plan : HEAD/NECK Cisplatin (40) q7d     12/30/2022 Procedure   G tube and port placement   05/02/2023 PET scan   IMPRESSION: Interval mixed response.   Prior hypermetabolic pharyngeal mass and cervical lymphadenopathy has resolved.   New thoracic nodal metastases and left upper lobe pulmonary metastases, as above.   No evidence of metastatic disease in the abdomen/pelvis.       05/27/2023 Progression   FNA of two LN + Squamous cell carcinoma, Lung LLL FNA and brush biopsy + squamous cell carcinoma   06/19/2023 -  Chemotherapy   Patient is on Treatment Plan : HEAD/NECK Pembrolizumab (200) D1 + Carboplatin (5) D1 + 5FU (1000) IVCI D1-4 q21d x 6 cycles / Pembrolizumab (200) D1 q21d        CURRENT THERAPY: Pembrolizumab, carboplatin, 5FU  INTERVAL HISTORY Alec Gibson returns for follow up as scheduled, last seen by my colleague Mardella Layman NP 06/19/23 and began Pembro/carbo/5FU.   ROS   Past Medical History:  Diagnosis Date   Anxiety    Cancer (HCC)    Tonsillar cancer -2024   Depression    Thyroid disease      Past Surgical History:  Procedure Laterality Date    BRONCHIAL BIOPSY  05/27/2023   Procedure: BRONCHIAL BIOPSIES;  Surgeon: Leslye Peer, MD;  Location: [facility] ENDOSCOPY;  Service: Pulmonary;;   BRONCHIAL BRUSHINGS  05/27/2023   Procedure: BRONCHIAL BRUSHINGS;  Surgeon: Leslye Peer, MD;  Location: [facility] ENDOSCOPY;  Service: Pulmonary;;   BRONCHIAL NEEDLE ASPIRATION BIOPSY  05/27/2023   Procedure: BRONCHIAL NEEDLE ASPIRATION BIOPSIES;  Surgeon: Leslye Peer, MD;  Location: [facility] ENDOSCOPY;  Service: Pulmonary;;   IR GASTROSTOMY TUBE MOD SED  12/30/2022   IR GASTROSTOMY TUBE REMOVAL  06/11/2023   IR IMAGING GUIDED PORT INSERTION  12/30/2022   IR NASO G TUBE PLC W/FL W/RAD  12/30/2022   VIDEO BRONCHOSCOPY WITH ENDOBRONCHIAL ULTRASOUND Bilateral 05/27/2023   Procedure: VIDEO BRONCHOSCOPY WITH ENDOBRONCHIAL ULTRASOUND;  Surgeon: Leslye Peer, MD;  Location: [facility] ENDOSCOPY;  Service: Pulmonary;  Laterality: Bilateral;     Outpatient Encounter Medications as of 07/07/2023  Medication Sig   Ascorbic Acid (VITAMIN C PO) Take 1 tablet by mouth in the morning.   Cholecalciferol (VITAMIN D-3 PO) Take 1 capsule by mouth in the morning.   clonazePAM (KLONOPIN) 1 MG tablet Take 1 mg by mouth every evening.   dexamethasone (DECADRON) 4 MG tablet Take 2 tablets (8mg ) by mouth daily starting the day after carboplatin for 3 days. Take with food (Patient not taking: Reported on 06/06/2023)   fluconazole (DIFLUCAN) 200 MG tablet Take 1 tablet by mouth every other day x 3 days   levocetirizine (XYZAL) 5 MG tablet Take 10 mg  by mouth every evening.   levothyroxine (SYNTHROID) 200 MCG tablet Take 200 mcg by mouth daily before breakfast.   lidocaine-prilocaine (EMLA) cream Apply to affected area once   Multiple Vitamin (MULTIVITAMIN) tablet Take 1 tablet by mouth in the morning.   Multiple Vitamins-Minerals (ZINC PO) Take 1 tablet by mouth in the morning.   Omega-3 Fatty Acids (FISH OIL PO) Take 1 capsule by mouth in the morning.   ondansetron (ZOFRAN) 8 MG tablet Take 1  tablet (8 mg total) by mouth every 8 (eight) hours as needed for nausea or vomiting. Start on the third day after carboplatin.   prochlorperazine (COMPAZINE) 10 MG tablet Take 1 tablet (10 mg total) by mouth every 6 (six) hours as needed for nausea or vomiting.   No facility-administered encounter medications on file as of 07/07/2023.     There were no vitals filed for this visit. There is no height or weight on file to calculate BMI.   PHYSICAL EXAM GENERAL:alert, no distress and comfortable SKIN: no rash  EYES: sclera clear NECK: without mass LYMPH:  no palpable cervical or supraclavicular lymphadenopathy  LUNGS: clear with normal breathing effort HEART: regular rate & rhythm, no lower extremity edema ABDOMEN: abdomen soft, non-tender and normal bowel sounds NEURO: alert & oriented x 3 with fluent speech, no focal motor/sensory deficits Breast exam:  PAC without erythema    CBC    Component Value Date/Time   WBC 4.2 06/19/2023 1122   WBC 4.2 05/27/2023 0931   RBC 3.91 (L) 06/19/2023 1122   HGB 13.2 06/19/2023 1122   HCT 38.3 (L) 06/19/2023 1122   PLT 189 06/19/2023 1122   MCV 98.0 06/19/2023 1122   MCH 33.8 06/19/2023 1122   MCHC 34.5 06/19/2023 1122   RDW 11.9 06/19/2023 1122   LYMPHSABS 0.5 (L) 06/19/2023 1122   MONOABS 0.8 06/19/2023 1122   EOSABS 0.0 06/19/2023 1122   BASOSABS 0.0 06/19/2023 1122     CMP     Component Value Date/Time   NA 137 06/19/2023 1246   K 4.2 06/19/2023 1246   CL 103 06/19/2023 1246   CO2 28 06/19/2023 1246   GLUCOSE 86 06/19/2023 1246   BUN 22 06/19/2023 1246   CREATININE 0.78 06/19/2023 1246   CALCIUM 10.1 06/19/2023 1246   PROT 7.3 06/19/2023 1246   ALBUMIN 4.0 06/19/2023 1246   AST 17 06/19/2023 1246   ALT 13 06/19/2023 1246   ALKPHOS 74 06/19/2023 1246   BILITOT 0.5 06/19/2023 1246   GFRNONAA >60 06/19/2023 1246     ASSESSMENT & PLAN: 67 yo male  SCC of oropharynx, cT4N2M0, P16+; lung metastasis 04/2023, Stage  IV -Diagnosed 11/2022, s/p chemoradiation with cisplatin 12/11/22 - 01/22/23 -PET 05/02/23 showed mixed response with resolution of the hypermetabolic pharyngeal mass and cervical adenopathy, but with new thoracic node and Left lung mass -05/27/23 FNA of 2 LNs and lung mass confirmed metastatic SCC -Began first line Pembro/Carbo/5FU 06/19/23   PLAN:  No orders of the defined types were placed in this encounter.     All questions were answered. The patient knows to call the clinic with any problems, questions or concerns. No barriers to learning were detected. I spent *** counseling the patient face to face. The total time spent in the appointment was *** and more than 50% was on counseling, review of test results, and coordination of care.   Santiago Glad, NP-C @DATE @

## 2023-07-07 ENCOUNTER — Inpatient Hospital Stay: Payer: Medicare Other

## 2023-07-07 ENCOUNTER — Encounter: Payer: Self-pay | Admitting: Hematology and Oncology

## 2023-07-07 ENCOUNTER — Inpatient Hospital Stay (HOSPITAL_BASED_OUTPATIENT_CLINIC_OR_DEPARTMENT_OTHER): Payer: Medicare Other | Admitting: Nurse Practitioner

## 2023-07-07 ENCOUNTER — Encounter: Payer: Self-pay | Admitting: Nurse Practitioner

## 2023-07-07 VITALS — BP 99/69 | HR 67 | Temp 98.0°F | Resp 14 | Ht 68.0 in | Wt 125.5 lb

## 2023-07-07 DIAGNOSIS — C109 Malignant neoplasm of oropharynx, unspecified: Secondary | ICD-10-CM | POA: Diagnosis not present

## 2023-07-07 DIAGNOSIS — B370 Candidal stomatitis: Secondary | ICD-10-CM | POA: Diagnosis not present

## 2023-07-07 DIAGNOSIS — Z5112 Encounter for antineoplastic immunotherapy: Secondary | ICD-10-CM | POA: Diagnosis not present

## 2023-07-07 DIAGNOSIS — C090 Malignant neoplasm of tonsillar fossa: Secondary | ICD-10-CM

## 2023-07-07 DIAGNOSIS — Z95828 Presence of other vascular implants and grafts: Secondary | ICD-10-CM

## 2023-07-07 LAB — CMP (CANCER CENTER ONLY)
ALT: 10 U/L (ref 0–44)
AST: 12 U/L — ABNORMAL LOW (ref 15–41)
Albumin: 3.7 g/dL (ref 3.5–5.0)
Alkaline Phosphatase: 64 U/L (ref 38–126)
Anion gap: 3 — ABNORMAL LOW (ref 5–15)
BUN: 19 mg/dL (ref 8–23)
CO2: 32 mmol/L (ref 22–32)
Calcium: 9.4 mg/dL (ref 8.9–10.3)
Chloride: 103 mmol/L (ref 98–111)
Creatinine: 0.76 mg/dL (ref 0.61–1.24)
GFR, Estimated: >60 mL/min (ref >60)
Glucose, Bld: 112 mg/dL — ABNORMAL HIGH (ref 70–99)
Potassium: 4.4 mmol/L (ref 3.5–5.1)
Sodium: 138 mmol/L (ref 135–145)
Total Bilirubin: 0.3 mg/dL (ref 0.3–1.2)
Total Protein: 6.1 g/dL — ABNORMAL LOW (ref 6.5–8.1)

## 2023-07-07 LAB — CBC WITH DIFFERENTIAL (CANCER CENTER ONLY)
Abs Immature Granulocytes: 0.01 10*3/uL (ref 0.00–0.07)
Basophils Absolute: 0 10*3/uL (ref 0.0–0.1)
Basophils Relative: 0 %
Eosinophils Absolute: 0.1 10*3/uL (ref 0.0–0.5)
Eosinophils Relative: 1 %
HCT: 34.8 % — ABNORMAL LOW (ref 39.0–52.0)
Hemoglobin: 11.9 g/dL — ABNORMAL LOW (ref 13.0–17.0)
Immature Granulocytes: 0 %
Lymphocytes Relative: 12 %
Lymphs Abs: 0.4 10*3/uL — ABNORMAL LOW (ref 0.7–4.0)
MCH: 33.3 pg (ref 26.0–34.0)
MCHC: 34.2 g/dL (ref 30.0–36.0)
MCV: 97.5 fL (ref 80.0–100.0)
Monocytes Absolute: 0.7 10*3/uL (ref 0.1–1.0)
Monocytes Relative: 18 %
Neutro Abs: 2.6 10*3/uL (ref 1.7–7.7)
Neutrophils Relative %: 69 %
Platelet Count: 131 10*3/uL — ABNORMAL LOW (ref 150–400)
RBC: 3.57 MIL/uL — ABNORMAL LOW (ref 4.22–5.81)
RDW: 12.7 % (ref 11.5–15.5)
WBC Count: 3.7 10*3/uL — ABNORMAL LOW (ref 4.0–10.5)
nRBC: 0 % (ref 0.0–0.2)

## 2023-07-07 MED ORDER — PALONOSETRON HCL INJECTION 0.25 MG/5ML
0.2500 mg | Freq: Once | INTRAVENOUS | Status: AC
  Administered 2023-07-07: 0.25 mg via INTRAVENOUS
  Filled 2023-07-07: qty 5

## 2023-07-07 MED ORDER — HEPARIN SOD (PORK) LOCK FLUSH 100 UNIT/ML IV SOLN: 500.0000 [IU] | Freq: Once | INTRAVENOUS | Status: DC | PRN

## 2023-07-07 MED ORDER — SODIUM CHLORIDE 0.9 % IV SOLN
800.0000 mg/m2/d | INTRAVENOUS | Status: DC
  Administered 2023-07-07: 5000 mg via INTRAVENOUS
  Filled 2023-07-07: qty 100

## 2023-07-07 MED ORDER — SODIUM CHLORIDE 0.9 % IV SOLN
200.0000 mg | Freq: Once | INTRAVENOUS | Status: AC
  Administered 2023-07-07: 200 mg via INTRAVENOUS
  Filled 2023-07-07: qty 200

## 2023-07-07 MED ORDER — FLUCONAZOLE 200 MG PO TABS: ORAL_TABLET | ORAL | 0 refills | Status: DC

## 2023-07-07 MED ORDER — SODIUM CHLORIDE 0.9 % IV SOLN
150.0000 mg | Freq: Once | INTRAVENOUS | Status: AC
  Administered 2023-07-07: 150 mg via INTRAVENOUS
  Filled 2023-07-07: qty 150

## 2023-07-07 MED ORDER — SODIUM CHLORIDE 0.9 % IV SOLN: Freq: Once | INTRAVENOUS | Status: AC

## 2023-07-07 MED ORDER — SODIUM CHLORIDE 0.9 % IV SOLN
10.0000 mg | Freq: Once | INTRAVENOUS | Status: AC
  Administered 2023-07-07: 10 mg via INTRAVENOUS
  Filled 2023-07-07: qty 10

## 2023-07-07 MED ORDER — MIRTAZAPINE 7.5 MG PO TABS: 7.5000 mg | ORAL_TABLET | Freq: Every day | ORAL | 3 refills | Status: DC

## 2023-07-07 MED ORDER — SODIUM CHLORIDE 0.9% FLUSH: 10.0000 mL | INTRAVENOUS | Status: DC | PRN

## 2023-07-07 MED ORDER — SODIUM CHLORIDE 0.9 % IV SOLN
336.4000 mg | Freq: Once | INTRAVENOUS | Status: AC
  Administered 2023-07-07: 340 mg via INTRAVENOUS
  Filled 2023-07-07: qty 34

## 2023-07-07 MED ORDER — SODIUM CHLORIDE 0.9% FLUSH
10.0000 mL | Freq: Once | INTRAVENOUS | Status: AC
  Administered 2023-07-07: 10 mL

## 2023-07-07 NOTE — Patient Instructions (Signed)
[facility]  Discharge Instructions: Thank you for choosing [facility] to provide your oncology and hematology care.   If you have a lab appointment with the Cancer Center, please go directly to the Cancer Center and check in at the registration area.   Wear comfortable clothing and clothing appropriate for easy access to any Portacath or PICC line.   We strive to give you quality time with your provider. You may need to reschedule your appointment if you arrive late (15 or more minutes).  Arriving late affects you and other patients whose appointments are after yours.  Also, if you miss three or more appointments without notifying the office, you may be dismissed from the clinic at the provider's discretion.      For prescription refill requests, have your pharmacy contact our office and allow 72 hours for refills to be completed.    Today you received the following chemotherapy and/or immunotherapy agents: Keytruda, Carboplatin, and Fluorouracil       To help prevent nausea and vomiting after your treatment, we encourage you to take your nausea medication as directed.  BELOW ARE SYMPTOMS THAT SHOULD BE REPORTED IMMEDIATELY: *FEVER GREATER THAN 100.4 F (38 C) OR HIGHER *CHILLS OR SWEATING *NAUSEA AND VOMITING THAT IS NOT CONTROLLED WITH YOUR NAUSEA MEDICATION *UNUSUAL SHORTNESS OF BREATH *UNUSUAL BRUISING OR BLEEDING *URINARY PROBLEMS (pain or burning when urinating, or frequent urination) *BOWEL PROBLEMS (unusual diarrhea, constipation, pain near the anus) TENDERNESS IN MOUTH AND THROAT WITH OR WITHOUT PRESENCE OF ULCERS (sore throat, sores in mouth, or a toothache) UNUSUAL RASH, SWELLING OR PAIN  UNUSUAL VAGINAL DISCHARGE OR ITCHING   Items with * indicate a potential emergency and should be followed up as soon as possible or go to the Emergency Department if any problems should occur.  Please show the CHEMOTHERAPY ALERT CARD or  IMMUNOTHERAPY ALERT CARD at check-in to the Emergency Department and triage nurse.  Should you have questions after your visit or need to cancel or reschedule your appointment, please contact [place] [facility]  Dept: [phone]  and follow the prompts.  Office hours are 8:00 a.m. to 4:30 p.m. Monday - Friday. Please note that voicemails left after 4:00 p.m. may not be returned until the following business day.  We are closed weekends and major holidays. You have access to a nurse at all times for urgent questions. Please call the main number to the clinic Dept: [phone] and follow the prompts.   For any non-urgent questions, you may also contact your provider using MyChart. We now offer e-Visits for anyone 41 and older to request care online for non-urgent symptoms. For details visit [email or web address].   Also download the MyChart app! Go to the app store, search "MyChart", open the app, select [place], and log in with your MyChart username and password.

## 2023-07-08 ENCOUNTER — Encounter: Payer: Self-pay | Admitting: Hematology and Oncology

## 2023-07-08 ENCOUNTER — Other Ambulatory Visit: Payer: Self-pay

## 2023-07-08 ENCOUNTER — Other Ambulatory Visit (HOSPITAL_COMMUNITY): Payer: Self-pay

## 2023-07-08 MED ORDER — NYSTATIN 100000 UNIT/ML MT SUSP
5.0000 mL | Freq: Four times a day (QID) | OROMUCOSAL | 1 refills | Status: DC | PRN
  Filled 2023-07-08: qty 140, 7d supply, fill #0

## 2023-07-08 MED ORDER — MAGIC MOUTHWASH W/LIDOCAINE: 5.0000 mL | Freq: Four times a day (QID) | ORAL | 1 refills | Status: DC | PRN

## 2023-07-08 NOTE — Progress Notes (Signed)
Received message from Santiago Glad NP to send referral to [facility] in [place]. [facility] [place] for a head and neck specialist for this patient who is moving on Jul 19, 2023. Spoke to the patient, he gave me the phone number of [phone] for the Lung Cancer Team and a fax number of [phone] for the referral to be faxed to. I forwarded this information to his provider Dr. Al Pimple  nurse Mena Pauls RN. She stated she would get the referral and needed information taken care of. He will need an appointment within 2 weeks.

## 2023-07-09 ENCOUNTER — Inpatient Hospital Stay: Payer: Medicare Other

## 2023-07-10 ENCOUNTER — Telehealth: Payer: Self-pay | Admitting: *Deleted

## 2023-07-10 NOTE — Telephone Encounter (Signed)
Records faxed to [facility] in [place] per pt's relocation and need to establish care.  Per Sharlette Dense at pt's visit yesterday: Received message from Santiago Glad NP to send referral to [facility] in [place]. [facility] [place] for a head and neck specialist for this patient who is moving on Jul 19, 2023. Spoke to the patient, he gave me the phone number of [phone] for the Lung Cancer Team and a fax number of [phone] for the referral to be faxed to. I forwarded this information to his provider Dr. Al Pimple nurse Mena Pauls RN. She stated she would get the referral and needed information taken care of. He will need an appointment within 2 weeks.

## 2023-07-11 ENCOUNTER — Encounter: Payer: Self-pay | Admitting: Hematology and Oncology

## 2023-07-11 ENCOUNTER — Other Ambulatory Visit (HOSPITAL_COMMUNITY): Payer: Self-pay

## 2023-07-11 ENCOUNTER — Other Ambulatory Visit: Payer: Self-pay

## 2023-07-11 ENCOUNTER — Inpatient Hospital Stay: Payer: Medicare Other | Attending: Hematology and Oncology

## 2023-07-11 VITALS — BP 97/72 | HR 82 | Temp 98.1°F | Resp 16

## 2023-07-11 DIAGNOSIS — C771 Secondary and unspecified malignant neoplasm of intrathoracic lymph nodes: Secondary | ICD-10-CM | POA: Diagnosis present

## 2023-07-11 DIAGNOSIS — C109 Malignant neoplasm of oropharynx, unspecified: Secondary | ICD-10-CM | POA: Diagnosis present

## 2023-07-11 DIAGNOSIS — Z452 Encounter for adjustment and management of vascular access device: Secondary | ICD-10-CM | POA: Diagnosis present

## 2023-07-11 MED ORDER — HEPARIN SOD (PORK) LOCK FLUSH 100 UNIT/ML IV SOLN
500.0000 [IU] | Freq: Once | INTRAVENOUS | Status: AC | PRN
  Administered 2023-07-11: 500 [IU]

## 2023-07-11 MED ORDER — SODIUM CHLORIDE 0.9% FLUSH
10.0000 mL | INTRAVENOUS | Status: DC | PRN
  Administered 2023-07-11: 10 mL

## 2023-07-17 ENCOUNTER — Encounter: Payer: Self-pay | Admitting: Hematology and Oncology

## 2023-07-23 ENCOUNTER — Telehealth: Payer: Self-pay | Admitting: Hematology and Oncology

## 2023-07-24 ENCOUNTER — Other Ambulatory Visit: Payer: Self-pay

## 2023-07-28 ENCOUNTER — Ambulatory Visit: Payer: 59 | Admitting: Adult Health

## 2023-07-28 ENCOUNTER — Ambulatory Visit: Payer: 59

## 2023-07-28 ENCOUNTER — Other Ambulatory Visit: Payer: 59

## 2023-07-29 NOTE — Therapy (Signed)
[facility] [address] [place], [place], [address] Phone: [phone]   Fax:  [phone]  Patient Details  Name: Alec Gibson MRN: [id number] Date of Birth: 1956/05/11 Referring Provider:  Lonie Peak, MD  Encounter Date: 07/29/2023  SPEECH THERAPY DISCHARGE SUMMARY  Visits from Start of Care: 5  Current functional level related to goals / functional outcomes: Pt did not reschedule ST visit on 05/07/23 that he cx'd. He has since moved to [place]. He will be formally d/c'd at this time. Goals from his last scheduled appointment on 04/08/23 are below:  SHORT TERM GOALS: Target: 3rd total session   Pt will compelte HEP with modified independence in 2 sessions Baseline: Goal status: Not met   2.  pt will tell SLP why pt is completing HEP with modified independence Baseline:  Goal status: Met   3.  pt will describe 3 overt s/s aspiration PNA with modified independence Baseline:  Goal status: Met   4.  pt will tell SLP how a food journal could hasten return to a more normalized diet Baseline:  Goal status: Met     LONG TERM GOALS: Target: 7th total session   1.  pt will complete HEP with independence over two visits Baseline:  Goal status: Ongoing   2.  pt will describe how to modify HEP over time, and the timeline associated with reduction in HEP frequency with modified independence over two sessions Baseline:  Goal status: Ongoing   Remaining deficits: Unknown, due to unexpected d/c.   Education / Equipment: See therapy notes.    Patient agrees to discharge. Patient goals were partially met. Patient is being discharged due to meeting the stated rehab goals.Marland Kitchen     Tiarrah Saville, CCC-SLP 07/29/2023, 4:25 PM  [place]  [facility] [address] [place], [place], [address] Phone: [phone]   Fax:  [phone]

## 2023-07-31 ENCOUNTER — Encounter (HOSPITAL_COMMUNITY): Payer: Self-pay

## 2023-08-09 NOTE — Assessment & Plan Note (Addendum)
-   Stage IV head and neck cancer. PET on 12/14 no evidence of metastatic disease. Large hypermetabolic right-sided oropharyngeal mass consistent with known squamous cell neoplasm. Associated extensive bilateral multistation hypermetabolic lymphadenopathy. Patient underwent bronchoscopy on 05/27/2023 with Dr. Delton Coombes for left lower lobe and left upper lobe pulmonary nodule along with mediastinal adenopathy.  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 He is established with Dr. Al Pimple.  Patient is moving back to [place] but will start chemotherapy treatment here.

## 2023-08-18 ENCOUNTER — Other Ambulatory Visit: Payer: 59

## 2023-08-18 ENCOUNTER — Ambulatory Visit: Payer: 59 | Admitting: Hematology and Oncology

## 2023-08-18 ENCOUNTER — Ambulatory Visit: Payer: 59

## 2023-08-22 ENCOUNTER — Other Ambulatory Visit: Payer: Medicare Other

## 2023-08-22 ENCOUNTER — Ambulatory Visit: Payer: Medicare Other | Admitting: Hematology and Oncology

## 2023-10-04 ENCOUNTER — Other Ambulatory Visit: Payer: Self-pay

## 2024-02-07 DEATH — deceased

## 2024-02-13 ENCOUNTER — Other Ambulatory Visit: Payer: Self-pay
